# Patient Record
Sex: Female | Born: 1945 | Race: White | Hispanic: No | Marital: Married | Smoking: Never smoker
Health system: Southern US, Community
[De-identification: ages and names within clinical notes are randomized; demographics above are authoritative.]

## PROBLEM LIST (undated history)

## (undated) DIAGNOSIS — R42 Dizziness and giddiness: Secondary | ICD-10-CM

## (undated) DIAGNOSIS — E785 Hyperlipidemia, unspecified: Secondary | ICD-10-CM

## (undated) DIAGNOSIS — Z860100 Personal history of colon polyps, unspecified: Secondary | ICD-10-CM

## (undated) DIAGNOSIS — M1990 Unspecified osteoarthritis, unspecified site: Secondary | ICD-10-CM

## (undated) DIAGNOSIS — M7120 Synovial cyst of popliteal space [Baker], unspecified knee: Secondary | ICD-10-CM

## (undated) HISTORY — DX: Personal history of colonic polyps: Z86.010

## (undated) HISTORY — DX: Unspecified osteoarthritis, unspecified site: M19.90

## (undated) HISTORY — DX: Personal history of colon polyps, unspecified: Z86.0100

## (undated) HISTORY — DX: Hyperlipidemia, unspecified: E78.5

## (undated) HISTORY — PX: JOINT REPLACEMENT: SHX530

## (undated) HISTORY — DX: Synovial cyst of popliteal space (Baker), unspecified knee: M71.20

## (undated) HISTORY — PX: TUBAL LIGATION: SHX77

---

## 1982-03-11 HISTORY — PX: HEMORRHOID SURGERY: SHX153

## 1992-03-11 HISTORY — PX: KNEE SURGERY: SHX244

## 1998-07-20 ENCOUNTER — Other Ambulatory Visit: Admission: RE | Admit: 1998-07-20 | Discharge: 1998-07-20 | Payer: Self-pay | Admitting: Family Medicine

## 1999-06-18 ENCOUNTER — Ambulatory Visit (HOSPITAL_COMMUNITY): Admission: RE | Admit: 1999-06-18 | Discharge: 1999-06-18 | Payer: Self-pay | Admitting: Gastroenterology

## 1999-12-03 ENCOUNTER — Encounter: Payer: Self-pay | Admitting: Internal Medicine

## 1999-12-03 ENCOUNTER — Encounter: Admission: RE | Admit: 1999-12-03 | Discharge: 1999-12-03 | Payer: Self-pay | Admitting: Internal Medicine

## 2000-12-12 ENCOUNTER — Encounter: Payer: Self-pay | Admitting: Internal Medicine

## 2000-12-12 ENCOUNTER — Encounter: Admission: RE | Admit: 2000-12-12 | Discharge: 2000-12-12 | Payer: Self-pay | Admitting: Internal Medicine

## 2002-01-06 ENCOUNTER — Encounter: Payer: Self-pay | Admitting: Internal Medicine

## 2002-01-06 ENCOUNTER — Encounter: Admission: RE | Admit: 2002-01-06 | Discharge: 2002-01-06 | Payer: Self-pay | Admitting: Internal Medicine

## 2002-08-31 ENCOUNTER — Ambulatory Visit (HOSPITAL_COMMUNITY): Admission: RE | Admit: 2002-08-31 | Discharge: 2002-08-31 | Payer: Self-pay | Admitting: Gastroenterology

## 2003-01-24 ENCOUNTER — Encounter: Admission: RE | Admit: 2003-01-24 | Discharge: 2003-01-24 | Payer: Self-pay | Admitting: Internal Medicine

## 2003-12-21 ENCOUNTER — Other Ambulatory Visit: Admission: RE | Admit: 2003-12-21 | Discharge: 2003-12-21 | Payer: Self-pay | Admitting: Internal Medicine

## 2004-02-20 ENCOUNTER — Encounter: Admission: RE | Admit: 2004-02-20 | Discharge: 2004-02-20 | Payer: Self-pay | Admitting: Internal Medicine

## 2005-03-07 ENCOUNTER — Encounter: Admission: RE | Admit: 2005-03-07 | Discharge: 2005-03-07 | Payer: Self-pay | Admitting: Internal Medicine

## 2005-10-22 ENCOUNTER — Ambulatory Visit: Payer: Self-pay | Admitting: Internal Medicine

## 2006-01-24 ENCOUNTER — Other Ambulatory Visit: Admission: RE | Admit: 2006-01-24 | Discharge: 2006-01-24 | Payer: Self-pay | Admitting: Internal Medicine

## 2006-01-24 ENCOUNTER — Ambulatory Visit: Payer: Self-pay | Admitting: Internal Medicine

## 2006-01-24 LAB — CONVERTED CEMR LAB
BUN: 9 mg/dL (ref 6–23)
CO2: 29 meq/L (ref 19–32)
Calcium: 9.5 mg/dL (ref 8.4–10.5)
Chloride: 106 meq/L (ref 96–112)
Creatinine, Ser: 0.8 mg/dL (ref 0.4–1.2)
Glucose, Bld: 94 mg/dL (ref 70–99)
HCT: 42.3 % (ref 36.0–46.0)
Hemoglobin: 14.2 g/dL (ref 12.0–15.0)
MCHC: 33.5 g/dL (ref 30.0–36.0)
Platelets: 191 10*3/uL (ref 150–400)
RBC: 4.44 M/uL (ref 3.87–5.11)
Sodium: 142 meq/L (ref 135–145)
Total Bilirubin: 1.3 mg/dL — ABNORMAL HIGH (ref 0.3–1.2)
Total Protein: 7 g/dL (ref 6.0–8.3)

## 2006-02-08 DIAGNOSIS — Z860100 Personal history of colon polyps, unspecified: Secondary | ICD-10-CM | POA: Insufficient documentation

## 2006-03-07 ENCOUNTER — Encounter: Payer: Self-pay | Admitting: Internal Medicine

## 2006-03-12 ENCOUNTER — Ambulatory Visit: Payer: Self-pay | Admitting: Internal Medicine

## 2006-03-25 ENCOUNTER — Encounter: Admission: RE | Admit: 2006-03-25 | Discharge: 2006-03-25 | Payer: Self-pay | Admitting: Internal Medicine

## 2006-04-15 DIAGNOSIS — M810 Age-related osteoporosis without current pathological fracture: Secondary | ICD-10-CM

## 2006-04-15 DIAGNOSIS — E785 Hyperlipidemia, unspecified: Secondary | ICD-10-CM

## 2006-08-19 ENCOUNTER — Encounter: Admission: RE | Admit: 2006-08-19 | Discharge: 2006-08-19 | Payer: Self-pay | Admitting: Interventional Radiology

## 2006-10-03 ENCOUNTER — Telehealth (INDEPENDENT_AMBULATORY_CARE_PROVIDER_SITE_OTHER): Payer: Self-pay | Admitting: *Deleted

## 2006-10-16 ENCOUNTER — Ambulatory Visit: Payer: Self-pay | Admitting: Internal Medicine

## 2006-10-23 LAB — CONVERTED CEMR LAB
ALT: 20 units/L (ref 0–35)
AST: 22 units/L (ref 0–37)
Direct LDL: 132.4 mg/dL
Total CHOL/HDL Ratio: 3.5
Triglycerides: 79 mg/dL (ref 0–149)
VLDL: 16 mg/dL (ref 0–40)

## 2006-12-16 ENCOUNTER — Encounter (INDEPENDENT_AMBULATORY_CARE_PROVIDER_SITE_OTHER): Payer: Self-pay | Admitting: *Deleted

## 2007-03-11 ENCOUNTER — Ambulatory Visit: Payer: Self-pay | Admitting: Internal Medicine

## 2007-03-27 ENCOUNTER — Encounter: Admission: RE | Admit: 2007-03-27 | Discharge: 2007-03-27 | Payer: Self-pay | Admitting: Internal Medicine

## 2007-04-07 ENCOUNTER — Ambulatory Visit: Payer: Self-pay | Admitting: Internal Medicine

## 2007-04-07 ENCOUNTER — Other Ambulatory Visit: Admission: RE | Admit: 2007-04-07 | Discharge: 2007-04-07 | Payer: Self-pay | Admitting: Internal Medicine

## 2007-04-07 ENCOUNTER — Encounter: Payer: Self-pay | Admitting: Internal Medicine

## 2007-04-07 DIAGNOSIS — R519 Headache, unspecified: Secondary | ICD-10-CM | POA: Insufficient documentation

## 2007-04-07 DIAGNOSIS — R51 Headache: Secondary | ICD-10-CM

## 2007-04-07 LAB — CONVERTED CEMR LAB
Ketones, urine, test strip: NEGATIVE
Nitrite: NEGATIVE
Pap Smear: NORMAL
Urobilinogen, UA: NEGATIVE
WBC Urine, dipstick: NEGATIVE
pH: 6

## 2007-04-10 ENCOUNTER — Encounter (INDEPENDENT_AMBULATORY_CARE_PROVIDER_SITE_OTHER): Payer: Self-pay | Admitting: *Deleted

## 2007-04-10 LAB — CONVERTED CEMR LAB
CO2: 31 meq/L (ref 19–32)
Chloride: 106 meq/L (ref 96–112)
Cholesterol: 153 mg/dL (ref 0–200)
Creatinine, Ser: 0.7 mg/dL (ref 0.4–1.2)
Eosinophils Absolute: 0.1 10*3/uL (ref 0.0–0.6)
Eosinophils Relative: 2.2 % (ref 0.0–5.0)
HCT: 40.8 % (ref 36.0–46.0)
Hemoglobin: 13.5 g/dL (ref 12.0–15.0)
MCV: 94.8 fL (ref 78.0–100.0)
Monocytes Absolute: 0.4 10*3/uL (ref 0.2–0.7)
Neutrophils Relative %: 59.7 % (ref 43.0–77.0)
Potassium: 4.9 meq/L (ref 3.5–5.1)
RBC: 4.31 M/uL (ref 3.87–5.11)
RDW: 10.8 % — ABNORMAL LOW (ref 11.5–14.6)
Sodium: 143 meq/L (ref 135–145)
Total CHOL/HDL Ratio: 4
WBC: 5.1 10*3/uL (ref 4.5–10.5)

## 2007-04-15 ENCOUNTER — Encounter: Payer: Self-pay | Admitting: Internal Medicine

## 2007-05-08 ENCOUNTER — Telehealth (INDEPENDENT_AMBULATORY_CARE_PROVIDER_SITE_OTHER): Payer: Self-pay | Admitting: *Deleted

## 2008-01-06 ENCOUNTER — Ambulatory Visit: Payer: Self-pay | Admitting: Internal Medicine

## 2008-01-06 DIAGNOSIS — J309 Allergic rhinitis, unspecified: Secondary | ICD-10-CM | POA: Insufficient documentation

## 2008-01-06 DIAGNOSIS — R7309 Other abnormal glucose: Secondary | ICD-10-CM | POA: Insufficient documentation

## 2008-01-06 LAB — CONVERTED CEMR LAB
Blood Glucose, AC Bkfst: 107 mg/dL
Vit D, 1,25-Dihydroxy: 32 (ref 30–89)

## 2008-01-12 ENCOUNTER — Encounter (INDEPENDENT_AMBULATORY_CARE_PROVIDER_SITE_OTHER): Payer: Self-pay | Admitting: *Deleted

## 2008-01-12 LAB — CONVERTED CEMR LAB: Hgb A1c MFr Bld: 5.6 % (ref 4.6–6.0)

## 2008-02-23 ENCOUNTER — Telehealth: Payer: Self-pay | Admitting: Internal Medicine

## 2008-03-28 ENCOUNTER — Encounter: Admission: RE | Admit: 2008-03-28 | Discharge: 2008-03-28 | Payer: Self-pay | Admitting: Internal Medicine

## 2008-04-01 ENCOUNTER — Encounter (INDEPENDENT_AMBULATORY_CARE_PROVIDER_SITE_OTHER): Payer: Self-pay | Admitting: *Deleted

## 2008-05-09 ENCOUNTER — Ambulatory Visit: Payer: Self-pay | Admitting: Family Medicine

## 2008-05-30 ENCOUNTER — Ambulatory Visit: Payer: Self-pay | Admitting: Internal Medicine

## 2008-05-30 ENCOUNTER — Encounter: Payer: Self-pay | Admitting: Internal Medicine

## 2008-05-30 ENCOUNTER — Other Ambulatory Visit: Admission: RE | Admit: 2008-05-30 | Discharge: 2008-05-30 | Payer: Self-pay | Admitting: Internal Medicine

## 2008-06-02 ENCOUNTER — Encounter (INDEPENDENT_AMBULATORY_CARE_PROVIDER_SITE_OTHER): Payer: Self-pay | Admitting: *Deleted

## 2008-06-03 ENCOUNTER — Ambulatory Visit: Payer: Self-pay | Admitting: Internal Medicine

## 2008-06-05 LAB — CONVERTED CEMR LAB
AST: 24 units/L (ref 0–37)
Basophils Absolute: 0 10*3/uL (ref 0.0–0.1)
Cholesterol: 182 mg/dL (ref 0–200)
Eosinophils Relative: 3.2 % (ref 0.0–5.0)
GFR calc non Af Amer: 108.02 mL/min (ref >60)
Glucose, Bld: 95 mg/dL (ref 70–99)
LDL Cholesterol: 109 mg/dL — ABNORMAL HIGH (ref 0–99)
MCHC: 34.6 g/dL (ref 30.0–36.0)
Monocytes Absolute: 0.4 10*3/uL (ref 0.1–1.0)
Monocytes Relative: 8.2 % (ref 3.0–12.0)
Neutrophils Relative %: 50.7 % (ref 43.0–77.0)
Potassium: 4 meq/L (ref 3.5–5.1)
RBC: 4.06 M/uL (ref 3.87–5.11)
Sodium: 143 meq/L (ref 135–145)
TSH: 1.03 microintl units/mL (ref 0.35–5.50)
VLDL: 15.4 mg/dL (ref 0.0–40.0)

## 2008-06-06 ENCOUNTER — Encounter (INDEPENDENT_AMBULATORY_CARE_PROVIDER_SITE_OTHER): Payer: Self-pay | Admitting: *Deleted

## 2009-01-11 ENCOUNTER — Ambulatory Visit: Payer: Self-pay | Admitting: Family

## 2009-01-13 ENCOUNTER — Telehealth: Payer: Self-pay | Admitting: Family

## 2009-03-24 ENCOUNTER — Telehealth (INDEPENDENT_AMBULATORY_CARE_PROVIDER_SITE_OTHER): Payer: Self-pay | Admitting: *Deleted

## 2009-03-28 ENCOUNTER — Ambulatory Visit: Payer: Self-pay | Admitting: Internal Medicine

## 2009-04-05 ENCOUNTER — Encounter: Admission: RE | Admit: 2009-04-05 | Discharge: 2009-04-05 | Payer: Self-pay | Admitting: Internal Medicine

## 2009-06-05 ENCOUNTER — Other Ambulatory Visit: Admission: RE | Admit: 2009-06-05 | Discharge: 2009-06-05 | Payer: Self-pay | Admitting: Internal Medicine

## 2009-06-05 ENCOUNTER — Ambulatory Visit: Payer: Self-pay | Admitting: Internal Medicine

## 2009-06-05 LAB — HM PAP SMEAR

## 2009-06-10 LAB — CONVERTED CEMR LAB: Pap Smear: NEGATIVE

## 2009-06-28 ENCOUNTER — Ambulatory Visit: Payer: Self-pay | Admitting: Internal Medicine

## 2009-06-28 LAB — CONVERTED CEMR LAB: Vit D, 25-Hydroxy: 40 ng/mL (ref 30–89)

## 2009-07-03 LAB — CONVERTED CEMR LAB
ALT: 25 units/L (ref 0–35)
AST: 28 units/L (ref 0–37)
BUN: 9 mg/dL (ref 6–23)
Basophils Absolute: 0 10*3/uL (ref 0.0–0.1)
Chloride: 107 meq/L (ref 96–112)
Cholesterol: 179 mg/dL (ref 0–200)
Creatinine, Ser: 0.7 mg/dL (ref 0.4–1.2)
Eosinophils Relative: 2.6 % (ref 0.0–5.0)
Glucose, Bld: 87 mg/dL (ref 70–99)
HCT: 36.8 % (ref 36.0–46.0)
LDL Cholesterol: 106 mg/dL — ABNORMAL HIGH (ref 0–99)
Lymphocytes Relative: 35.5 % (ref 12.0–46.0)
Lymphs Abs: 1.8 10*3/uL (ref 0.7–4.0)
Monocytes Relative: 6.7 % (ref 3.0–12.0)
Neutrophils Relative %: 54.8 % (ref 43.0–77.0)
Platelets: 166 10*3/uL (ref 150.0–400.0)
RDW: 12.3 % (ref 11.5–14.6)
Sodium: 144 meq/L (ref 135–145)
TSH: 1.55 microintl units/mL (ref 0.35–5.50)
Total CHOL/HDL Ratio: 3
VLDL: 16.6 mg/dL (ref 0.0–40.0)
WBC: 5 10*3/uL (ref 4.5–10.5)

## 2010-01-11 ENCOUNTER — Encounter (INDEPENDENT_AMBULATORY_CARE_PROVIDER_SITE_OTHER): Payer: Self-pay | Admitting: *Deleted

## 2010-01-19 ENCOUNTER — Ambulatory Visit: Payer: Self-pay | Admitting: Internal Medicine

## 2010-02-06 ENCOUNTER — Telehealth: Payer: Self-pay | Admitting: Internal Medicine

## 2010-02-07 ENCOUNTER — Telehealth: Payer: Self-pay | Admitting: Internal Medicine

## 2010-02-09 ENCOUNTER — Ambulatory Visit: Payer: Self-pay | Admitting: Internal Medicine

## 2010-02-09 ENCOUNTER — Encounter: Payer: Self-pay | Admitting: Internal Medicine

## 2010-02-12 LAB — CONVERTED CEMR LAB
BUN: 18 mg/dL
Basophils Absolute: 0 10*3/uL
Basophils Relative: 0.3 %
CO2: 30 meq/L
Calcium: 9.8 mg/dL
Chloride: 102 meq/L
Creatinine, Ser: 0.7 mg/dL
Eosinophils Absolute: 0.1 10*3/uL
Eosinophils Relative: 1.4 %
GFR calc non Af Amer: 91.42 mL/min
Glucose, Bld: 109 mg/dL — ABNORMAL HIGH
HCT: 42.5 %
Hemoglobin: 14.6 g/dL
Lymphocytes Relative: 34.9 %
Lymphs Abs: 2.3 10*3/uL
MCHC: 34.3 g/dL
MCV: 95.4 fL
Monocytes Absolute: 0.6 10*3/uL
Monocytes Relative: 8.4 %
Neutro Abs: 3.6 10*3/uL
Neutrophils Relative %: 55 %
Platelets: 203 10*3/uL
Potassium: 5.4 meq/L — ABNORMAL HIGH
RBC: 4.46 M/uL
RDW: 11.9 %
Sodium: 142 meq/L
WBC: 6.5 10*3/uL

## 2010-02-13 ENCOUNTER — Ambulatory Visit: Payer: Self-pay

## 2010-02-13 ENCOUNTER — Ambulatory Visit (HOSPITAL_COMMUNITY)
Admission: RE | Admit: 2010-02-13 | Discharge: 2010-02-13 | Payer: Self-pay | Source: Home / Self Care | Admitting: Internal Medicine

## 2010-02-13 ENCOUNTER — Encounter: Payer: Self-pay | Admitting: Internal Medicine

## 2010-02-13 ENCOUNTER — Telehealth (INDEPENDENT_AMBULATORY_CARE_PROVIDER_SITE_OTHER): Payer: Self-pay | Admitting: Radiology

## 2010-02-14 ENCOUNTER — Encounter (HOSPITAL_COMMUNITY)
Admission: RE | Admit: 2010-02-14 | Discharge: 2010-04-10 | Payer: Self-pay | Source: Home / Self Care | Attending: Internal Medicine | Admitting: Internal Medicine

## 2010-02-14 ENCOUNTER — Encounter: Payer: Self-pay | Admitting: *Deleted

## 2010-02-14 ENCOUNTER — Ambulatory Visit: Payer: Self-pay

## 2010-02-14 ENCOUNTER — Encounter: Payer: Self-pay | Admitting: Cardiology

## 2010-02-15 ENCOUNTER — Telehealth: Payer: Self-pay | Admitting: Internal Medicine

## 2010-04-06 ENCOUNTER — Encounter
Admission: RE | Admit: 2010-04-06 | Discharge: 2010-04-06 | Payer: Self-pay | Source: Home / Self Care | Attending: Internal Medicine | Admitting: Internal Medicine

## 2010-04-06 LAB — HM MAMMOGRAPHY: HM Mammogram: NEGATIVE

## 2010-04-10 NOTE — Assessment & Plan Note (Signed)
Summary: CHEST TIGHTNESS/REQUESTS CARDS REFERRAL FOR STRESS TEST/KB/rs...   Vital Signs:  Patient profile:   65 year old female Weight:      153.13 pounds Pulse rate:   88 / minute Pulse rhythm:   regular BP sitting:   116 / 84  (left arm) Cuff size:   large  Vitals Entered By: Army Fossa CMA (February 09, 2010 11:34 AM) CC: Pt here c/o discomfort in chest  Comments x 2 weeks Referral?  [facility] [address]    History of Present Illness: 2 weeks history of chest discomfort The discomfort is located anteriorly, described as an ache or  pressure. It may last few hours. No apparent triggers, no associated with exercise. no radiation, no associated with shortness of breath, intensity is 2/10 Sometimes has noticed nausea but inconsistently so no dizziness, lightheadedness?  Review of systems No fever No edema Rarely has palpitations No vomiting or GERD symptoms No cough For the last few months has not this dyspnea on exertion but no dyspnea at the time of chest discomfort Admits to stress in her life she did take an airplane  2 months ago, following the trip did not have leg pain or swelling  Current Medications (verified): 1)  Simvastatin 20 Mg  Tabs (Simvastatin) .... Take One Tablet Every Day. 2)  Flonase 50 Mcg/act  Susp (Fluticasone Propionate) .... Prn 3)  Otc Allergy Med .... As Needed 4)  Fish Oil--Vit D--Ca--Mvi-- Glucosamine 5)  Golden Na 6)  Bayer Low Strength 81 Mg Tbec (Aspirin) 7)  Boniva 150 Mg Tabs (Ibandronate Sodium) .Marland Kitchen.. 1 By Mouth Qmo  Allergies (verified): No Known Drug Allergies  Past History:  Past Medical History: Reviewed history from 06/05/2009 and no changes required. Hyperlipidemia Osteoporosis Colonic polyps, multiple Cscopes, last 12-07: TICS only, next 2012 Headache Allergic rhinitis OA-- sees ortho, 2011 they talked about TKR L h/o B Baker's cyst at the L per MRI ordered by ortho aprox 2008 patient thinks is B  Past Surgical  History: Reviewed history from 04/15/2006 and no changes required. Hemorrhoidectomy (1984) Tubal ligation  Family History: Reviewed history from 05/30/2008 and no changes required. MI-GM age 55 breast ca--no colon ca--mother HTN--mother myeloid dysplasia--F   Social History: Reviewed history from 06/05/2009 and no changes required. 2 kids Married Occupation: Location manager) tobacco-- never ETOH-- occasionally wine exercise-- + 3/week diet-- healthy   Physical Exam  General:  alert, well-developed, and well-nourished.   Neck:  no masses and normal carotid upstroke.   Chest Wall:  no tenderness.   Lungs:  normal respiratory effort, no intercostal retractions, no accessory muscle use, and normal breath sounds.   Heart:  normal rate, regular rhythm, and no murmur.   Abdomen:  soft, non-tender, no distention, no masses, no guarding, and no rigidity.   Extremities:  calves  are measured and symmetric. No tender to palpation. right ankle is slightly larger than the left, chronic finding per patient Psych:  not anxious appearing and not depressed appearing.     Impression & Recommendations:  Problem # 1:  CHEST PAIN (ICD-27.20) 65 year old female with chest discomfort for 2 weeks. Cardiovascular risk factors: No hypertension, well-controlled cholesterol, nonsmoker. Grandmother had CAD in her 15s she is active and exercises routinely without chest pain. EKG normal sinus plan: Labs, chest x-ray Increase aspirin to 325 Nitroglycerin p.r.n. ER if symptoms severe Stress test, echo  Orders: EKG w/ Interpretation (93000) Venipuncture (16109) TLB-BMP (Basic Metabolic Panel-BMET) (80048-METABOL) TLB-CBC Platelet -  w/Differential (85025-CBCD) T-2 View CXR (71020TC) Cardiology Referral (Cardiology)  Complete Medication List: 1)  Simvastatin 20 Mg Tabs (Simvastatin) .... Take one tablet every day. 2)  Flonase 50 Mcg/act Susp (Fluticasone  propionate) .... Prn 3)  Otc Allergy Med  .... As needed 4)  Fish Oil--vit D--ca--mvi-- Glucosamine  5)  Golden Na  6)  Aspirin 325 Mg Tabs (Aspirin) .... One a day 7)  Boniva 150 Mg Tabs (Ibandronate sodium) .Marland Kitchen.. 1 by mouth qmo 8)  Nitrostat 0.4 Mg Subl (Nitroglycerin) .... One sublingual every 5 minutes x3 if chest pain  Patient Instructions: 1)  increase aspirin to 325 mg 2)  Nitroglycerin if chest pain 3)  if the chest pain is severe--->  go to the ER 4)  return to the office in one month Prescriptions: NITROSTAT 0.4 MG SUBL (NITROGLYCERIN) one sublingual every 5 minutes x3 if chest pain  #20 x 0   Entered and Authorized by:   Nolon Rod. Kyriaki Moder MD   Signed by:   Nolon Rod. Raiyah Speakman MD on 02/09/2010   Method used:   Print then Give to Patient   RxID:   [id number]    Orders Added: 1)  EKG w/ Interpretation [93000] 2)  Venipuncture [36415] 3)  TLB-BMP (Basic Metabolic Panel-BMET) [80048-METABOL] 4)  TLB-CBC Platelet - w/Differential [85025-CBCD] 5)  T-2 View CXR [71020TC] 6)  Cardiology Referral [Cardiology] 7)  Est. Patient Level IV [30160]

## 2010-04-10 NOTE — Miscellaneous (Signed)
Summary: got flu shot @ [facility]   Immunization History:  Influenza Immunization History:    Influenza:  got @ [facility] (12/28/2009)

## 2010-04-10 NOTE — Assessment & Plan Note (Signed)
Summary: Cardiology Nuclear Testing  Nuclear Med Background Indications for Stress Test: Evaluation for Ischemia     Symptoms: Chest Pressure, Chest Tightness, Dizziness, DOE, Light-Headedness, Palpitations    Nuclear Pre-Procedure Cardiac Risk Factors: Family History - CAD, Lipids Caffeine/Decaff Intake: none NPO After: 7:00 AM Lungs: clear IV 0.9% NS with Angio Cath: 22g     IV Site: R Antecubital IV Started by: Bonnita Levan, RN Chest Size (in) 36     Cup Size D     Height (in): 63.75 Weight (lb): 154 BMI: 26.74  Nuclear Med Study 1 or 2 day study:  1 day     Stress Test Type:  Stress Reading MD:  Cassell Clement, MD     Referring MD:  J.Paz Resting Radionuclide:  Technetium 33m Tetrofosmin     Resting Radionuclide Dose:  11 mCi  Stress Radionuclide:  Technetium 90m Tetrofosmin     Stress Radionuclide Dose:  33 mCi   Stress Protocol Exercise Time (min):  6:30 min     Max HR:  157 bpm     Predicted Max HR:  158 bpm  Max Systolic BP: 171 mm Hg     Percent Max HR:  99.37 %     METS: 7.70 Rate Pressure Product:  16109    Stress Test Technologist:  Milana Na, EMT-P     Nuclear Technologist:  Domenic Polite, CNMT  Rest Procedure  Myocardial perfusion imaging was performed at rest 45 minutes following the intravenous administration of Technetium 75m Tetrofosmin.  Stress Procedure  The patient exercised for 6:30. The patient stopped due to fatigue and denied any chest pain.  There were no significant ST-T wave changes and rare pvcs/pacs.  Technetium 13m Tetrofosmin was injected at peak exercise and myocardial perfusion imaging was performed after a brief delay.  QPS Raw Data Images:  Normal; no motion artifact; normal heart/lung ratio. Stress Images:  Normal homogeneous uptake in all areas of the myocardium. Rest Images:  Normal homogeneous uptake in all areas of the myocardium. Subtraction (SDS):  No evidence of ischemia. Transient Ischemic Dilatation:  1.06   (Normal <1.22)  Lung/Heart Ratio:  .21  (Normal <0.45)  Quantitative Gated Spect Images QGS EDV:  71 ml QGS ESV:  20 ml QGS EF:  72 % QGS cine images:  No wall motion abnormalities  Findings Normal nuclear study      Overall Impression  Exercise Capacity: Good exercise capacity. BP Response: Normal blood pressure response. Clinical Symptoms: No chest pain ECG Impression: No significant ST segment change suggestive of ischemia. Overall Impression: Normal stress nuclear study.

## 2010-04-10 NOTE — Progress Notes (Signed)
Summary: left msg for pt to call  Phone Note Call from Patient   Caller: Patient Summary of Call: pt called and left msg having sinus and head cold, wanted to know what can she use?  Left msg for pt to call.Kandice Hams  March 24, 2009 3:04 PM   Follow-up for Phone Call        Called pt back, she says she is using Mucinex and nasal washes taking Tylenol PE, offered to make appt for Sat clinic, pt refused says she will watch over the weekend .Kandice Hams  March 24, 2009 5:06 PM  Follow-up by: Kandice Hams,  March 24, 2009 5:06 PM

## 2010-04-10 NOTE — Assessment & Plan Note (Signed)
Summary: CPX//PH   Vital Signs:  Patient profile:   65 year old female Height:      63.75 inches Weight:      155 pounds Pulse rate:   70 / minute BP sitting:   110 / 60  Vitals Entered By: Shary Decamp (June 05, 2009 12:57 PM) CC: cpx - due pap, not fasting Is Patient Diabetic? No Comments  - c/o of sinus, chest congestion x last week Shary Decamp  June 05, 2009 12:59 PM    History of Present Illness: CPX  c/o of sinus, chest congestion x last week denies fever, cough is mild with some mucus, taking over-the-counter Sudafed.  Current Medications (verified): 1)  Simvastatin 20 Mg  Tabs (Simvastatin) .... Take One Tablet Every Day. 2)  Flonase 50 Mcg/act  Susp (Fluticasone Propionate) .... Prn 3)  Otc Allergy Med .... As Needed 4)  Fish Oil--Vit D--Ca--Mvi-- Glucosamine 5)  Golden Na 6)  Bayer Low Strength 81 Mg Tbec (Aspirin) 7)  Boniva 150 Mg Tabs (Ibandronate Sodium) .Marland Kitchen.. 1 By Mouth Qmo  Allergies (verified): No Known Drug Allergies  Past History:  Past Medical History: Hyperlipidemia Osteoporosis Colonic polyps, multiple Cscopes, last 12-07: TICS only, next 2012 Headache Allergic rhinitis OA-- sees ortho, 2011 they talked about TKR L h/o B Baker's cyst at the L per MRI ordered by ortho aprox 2008 patient thinks is B  Past Surgical History: Reviewed history from 04/15/2006 and no changes required. Hemorrhoidectomy (1984) Tubal ligation  Family History: Reviewed history from 05/30/2008 and no changes required. 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 GI:  Denies bloody stools, diarrhea, nausea, and vomiting. GU:  Denies dysuria; no vag d/c or bleed . Psych:   Denies anxiety and depression.  Physical Exam  General:  alert, well-developed, and well-nourished.   Head:  face symmetric, not tender to palpation Ears:  wax  bilaterally Nose:  slightly congested Mouth:  no redness or discharge Neck:  no thyromegaly.   Breasts:  No mass, nodules, thickening, tenderness, bulging, retraction, inflamation, nipple discharge or skin changes noted.   Lungs:  normal respiratory effort, no intercostal retractions, no accessory muscle use, and normal breath sounds.   Heart:  normal rate, regular rhythm, and no murmur.   Abdomen:  soft, non-tender, no distention, no masses, and no guarding.   Genitalia:  normal introitus, no external lesions, no vaginal discharge, mucosa pink and moist, no vaginal or cervical lesions, no vaginal atrophy, no friaility or hemorrhage, normal uterus size and position, and no adnexal masses or tenderness.   Extremities:  no pretibial edema calves are symmetric and nontender Psych:  Oriented X3, memory intact for recent and remote, normally interactive, good eye contact, not anxious appearing, and not depressed appearing.     Impression & Recommendations:  Problem # 1:  HEALTH SCREENING (ICD-V70.0) Td 07 UTD on Cscope , next 2012 (Dr Madilyn Fireman) mammogram negative-----1/11 Pap neg ----3/10, Pap sent labs continue self breast exam  Problem # 2:  OSTEOPOROSIS (ICD-733.00)  due for a bone density test check vitamin D advised to remain active, take boniva  and vitamin D supplements as she is doing Her updated medication list for this problem includes:    Boniva 150 Mg Tabs (Ibandronate sodium) .Marland KitchenMarland KitchenMarland KitchenMarland Kitchen  1 by mouth qmo  Orders: Radiology Referral (Radiology)  Complete Medication List: 1)  Simvastatin 20 Mg Tabs (Simvastatin) .... Take one tablet every day. 2)  Flonase 50 Mcg/act Susp (Fluticasone propionate) .... Prn 3)  Otc Allergy Med  .... As needed 4)  Fish Oil--vit D--ca--mvi-- Glucosamine  5)  Golden Na  6)  Bayer Low  Strength 81 Mg Tbec (Aspirin) 7)  Boniva 150 Mg Tabs (Ibandronate sodium) .Marland Kitchen.. 1 by mouth qmo  Patient Instructions: 1)  come back fasting 2)  FLP, AST, ALT, CBC, TSH, BMP, vitamin D   V70 3)  Please schedule a follow-up appointment in 6 months .  Prescriptions: SIMVASTATIN 20 MG  TABS (SIMVASTATIN) Take one tablet every day.  #30.0 Each x 11   Entered by:   Shary Decamp   Authorized by:   Nolon Rod. Maybel Dambrosio MD   Signed by:   Shary Decamp on 06/05/2009   Method used:   Electronically to        [id number]* (retail)       [address]       [place], [place]  [address]       Ph: [phone]       Fax: [phone]   RxID:   [id number] BONIVA 150 MG TABS (IBANDRONATE SODIUM) 1 by mouth qmo  #1.0 Each x 11   Entered by:   Shary Decamp   Authorized by:   Nolon Rod. Jaciel Diem MD   Signed by:   Shary Decamp on 06/05/2009   Method used:   Electronically to        [facility] #[id number]* (retail)       [address]       [place], [place]  [address]       Ph: [phone]       Fax: [phone]   RxID:   [id number] FLONASE 50 MCG/ACT  SUSP (FLUTICASONE PROPIONATE) prn  #1 x 11   Entered by:   Shary Decamp   Authorized by:   Nolon Rod. Makalynn Berwanger MD   Signed by:   Shary Decamp on 06/05/2009   Method used:   Electronically to        [id number]* (retail)       [address]       [place], [place]  [address]       Ph: [phone]       Fax: [phone]   RxID:   [id number]    Preventive Care Screening  Prior Values:    Pap Smear:  NEGATIVE FOR INTRAEPITHELIAL LESIONS OR MALIGNANCY. (05/30/2008)    Mammogram:  ASSESSMENT: Negative - BI-RADS 1^MM DIGITAL SCREENING (04/05/2009)    Colonoscopy:  Results: Diverticulosis.       Location:  [facility].   (03/07/2006)    Last Tetanus Booster:  adacel (04/11/2005)    Risk  Factors: Tobacco use:  never Alcohol use:  yes    Drinks per day:  <1 Exercise:  yes    Type:  gym-water arerobics-walk  Colonoscopy History:    Date of Last Colonoscopy:  03/07/2006  Mammogram History:    Date of Last Mammogram:  04/05/2009  @  [facility]  PAP Smear History:    Date of Last PAP Smear:  05/30/2008

## 2010-04-10 NOTE — Assessment & Plan Note (Signed)
Summary: COUGHING/CONGESTION/KDC   Vital Signs:  Patient profile:   65 year old female Height:      63.75 inches Weight:      156.8 pounds BMI:     27.22 Temp:     98.9 degrees F BP sitting:   108 / 70  Vitals Entered By: Dena Billet CC: Chest congestion/cough Comments Requests refill on tussionex-said worked well   History of Present Illness: as above  5 days history of some cough, sore throat and sneezing use in Sudafed PE, Tylenol, Mucinex and a [employer] today she feels better than previous days  Allergies: No Known Drug Allergies  Past History:  Past Medical History: Reviewed history from 05/30/2008 and no changes required. Hyperlipidemia Osteoporosis Colonic polyps, multiple Cscopes, last 12-07: TICS only, next 2012 Headache Allergic rhinitis h/o B Baker's cyst at the L per MRI ordered by ortho aprox 2008 patient thinks is B)  Past Surgical History: Reviewed history from 04/15/2006 and no changes required. Hemorrhoidectomy (1984) Tubal ligation  Review of Systems       no fever some sinus and chest congestion, nasal discharge on mild amount of sputum no nausea or vomiting  Physical Exam  General:  alert, well-developed, and well-nourished.   Head:  not tender to palpation Ears:  narrow canals, mild amount of wax Nose:  slightly congested Mouth:  no redness or discharge Lungs:  few rhonchi with cough otherwise clear and no distress Heart:  normal rate, regular rhythm, and no murmur.     Impression & Recommendations:  Problem # 1:  BRONCHITIS- ACUTE (ICD-466.0) see instructions The following medications were removed from the medication list:    Tessalon Perles 100 Mg Caps (Benzonatate) ..... One cap every 8 hours as needed for cough Her updated medication list for this problem includes:    Tussionex Pennkinetic Er 8-10 Mg/66ml Lqcr (Chlorpheniramine-hydrocodone) ..... One teaspoon by mouth every 12 hours as needed for cough.    Amoxicillin 500  Mg Caps (Amoxicillin) .Marland Kitchen..Marland Kitchen Two tablets by mouth twice a day for one week  Complete Medication List: 1)  Simvastatin 20 Mg Tabs (Simvastatin) .... Take one tablet every day. 2)  Flonase 50 Mcg/act Susp (Fluticasone propionate) .... Prn 3)  Otc Allergy Med  .... As needed 4)  Fish Oil--vit D--ca--mvi-- Glucosamine  5)  Golden Na  6)  Bayer Low Strength 81 Mg Tbec (Aspirin) 7)  Boniva 150 Mg Tabs (Ibandronate sodium) .Marland Kitchen.. 1 by mouth qmo 8)  Tussionex Pennkinetic Er 8-10 Mg/39ml Lqcr (Chlorpheniramine-hydrocodone) .... One teaspoon by mouth every 12 hours as needed for cough. 9)  Amoxicillin 500 Mg Caps (Amoxicillin) .... Two tablets by mouth twice a day for one week  Patient Instructions: 1)  rest and lots of fluids 2)  continue using Sudafed PE, Tylenol, Mucinex and a [place] [place] as needed 3)  Tussionex as needed  if the cough is severe 4)  amoxicillin if no better in 2-3 days  5)  call if not better in a few days, call anytime if you feel worse Prescriptions: TUSSIONEX PENNKINETIC ER 8-10 MG/5ML LQCR (CHLORPHENIRAMINE-HYDROCODONE) one teaspoon by mouth every 12 hours as needed for cough.  #180cc x 0   Entered and Authorized by:   Nolon Rod. Alexande Sheerin MD   Signed by:   Nolon Rod. Wafaa Deemer MD on 03/28/2009   Method used:   Print then Give to Patient   RxID:   [id number] AMOXICILLIN 500 MG CAPS (AMOXICILLIN) two tablets by mouth twice a day for  one week  #28 x 0   Entered and Authorized by:   Nolon Rod. Braidyn Peace MD   Signed by:   Nolon Rod. Armas Mcbee MD on 03/28/2009   Method used:   Print then Give to Patient   RxID:   [id number]

## 2010-04-10 NOTE — Progress Notes (Signed)
Summary: Chest tightness  Phone Note Call from Patient Call back at Home Phone [phone] Call back at [phone]   Summary of Call: Patient left message on triage that she would like to be referred for stress test.   I spoke with the patient and she has been having tightness in her chest and slight tingling in her hands at times (she notes it is not all the time). Patient denies swelling, blurry vision, nausea, SOB, extremity pain, and excessive sweating.  She was advised that this will require an office visit and schedule appt for Thursday due to jury duty. She was advised that if symptoms are unbearable or get worse to head to the ER immediately. Patient expressed understanding. Initial call taken by: Lucious Groves CMA,  February 06, 2010 2:55 PM

## 2010-04-10 NOTE — Progress Notes (Signed)
Summary: Echo results  Phone Note Call from Patient Call back at Home Phone [phone]   Reason for Call: Insurance Question Summary of Call: Echo results. Please advise. Initial call taken by: Lucious Groves CMA,  February 15, 2010 8:05 AM  Follow-up for Phone Call        advised patient, echocardiogram and stress test negative. Is unlikely that the chest pain is cardiac. Discontinue nitroglycerin  observe for now, call if symptoms severe and  come back in one month Follow-up by: Jose E. Paz MD,  February 15, 2010 12:55 PM  Additional Follow-up for Phone Call Additional follow up Details #1::        Patient notified. Additional Follow-up by: Lucious Groves CMA,  February 15, 2010 3:03 PM

## 2010-04-10 NOTE — Assessment & Plan Note (Signed)
Summary: bowel problems--///sph   Vital Signs:  Patient profile:   65 year old female Height:      63.75 inches Weight:      154 pounds BMI:     26.74 Pulse rate:   65 / minute Pulse rhythm:   regular BP sitting:   132 / 82  (left arm) Cuff size:   large  Vitals Entered By: Army Fossa CMA (January 19, 2010 11:31 AM) CC: Pt here for f/u- not fasting Comments was have trouble with loose stools- better now [address]    History of Present Illness: 2 weeks ago she had loose, not watery stools diameter of stools slightly  narrow? things are now back to normal worried about colon ca   ROX (-) fever (-) nausea , vomoting no blood in stools  has noted wt gain w/o vin diet-exercise   Current Medications (verified): 1)  Simvastatin 20 Mg  Tabs (Simvastatin) .... Take One Tablet Every Day. 2)  Flonase 50 Mcg/act  Susp (Fluticasone Propionate) .... Prn 3)  Otc Allergy Med .... As Needed 4)  Fish Oil--Vit D--Ca--Mvi-- Glucosamine 5)  Golden Na 6)  Bayer Low Strength 81 Mg Tbec (Aspirin) 7)  Boniva 150 Mg Tabs (Ibandronate Sodium) .Marland Kitchen.. 1 By Mouth Qmo  Allergies (verified): No Known Drug Allergies  Past History:  Past Medical History: Reviewed history from 06/05/2009 and no changes required. Hyperlipidemia Osteoporosis Colonic polyps, multiple Cscopes, last 12-07: TICS only, next 2012 Headache Allergic rhinitis OA-- sees ortho, 2011 they talked about TKR L h/o B Baker's cyst at the L per MRI ordered by ortho aprox 2008 patient thinks is B  Past Surgical History: Reviewed history from 04/15/2006 and no changes required. Hemorrhoidectomy (1984) Tubal ligation  Social History: Reviewed history from 06/05/2009 and no changes required. 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   Abdomen:  soft, non-tender, no distention, no masses, no guarding, no rigidity, no rebound tenderness, no abdominal hernia, no hepatomegaly, and no splenomegaly.   Rectal:  No external abnormalities noted. Normal sphincter tone. No rectal masses or tenderness. hemocult neg    Impression & Recommendations:  Problem # 1:  DIARRHEA (ICD-787.91) transiet diarrhea, patient concerned about colon ca hemocult neg today, normal DRE plan: observe, will call if problems resurface  Complete Medication List: 1)  Simvastatin 20 Mg Tabs (Simvastatin) .... Take one tablet every day. 2)  Flonase 50 Mcg/act Susp (Fluticasone propionate) .... Prn 3)  Otc Allergy Med  .... As needed 4)  Fish Oil--vit D--ca--mvi-- Glucosamine  5)  Golden Na  6)  Bayer Low Strength 81 Mg Tbec (Aspirin) 7)  Boniva 150 Mg Tabs (Ibandronate sodium) .Marland Kitchen.. 1 by mouth qmo   Orders Added: 1)  Est. Patient Level III [[address]109]

## 2010-04-10 NOTE — Progress Notes (Signed)
Summary: checking on the patient  Phone Note Outgoing Call   Summary of Call: called to check on the patient, left a message. Advised ER if symptoms severe She has an appointment 2 days from today but we could see her later today or tomorrow as well. Jose E. Paz MD  February 07, 2010 1:26 PM   Follow-up for Phone Call        has an appointment today Follow-up by: [facility] E. Paz MD,  February 09, 2010 9:16 AM

## 2010-04-10 NOTE — Miscellaneous (Signed)
Summary: repeat colon due in 5y  Clinical Lists Changes  Observations: Added new observation of COLONRECACT: Repeat colonoscopy in 5 years.  (03/07/2006 9:30) Added new observation of COLONOSCOPY: Results: Diverticulosis.       Location:  [facility].   (03/07/2006 9:30)      Colonoscopy  Procedure date:  03/07/2006  Findings:      Results: Diverticulosis.       Location:  [facility].    Comments:      Repeat colonoscopy in 5 years.    Colonoscopy  Procedure date:  03/07/2006  Findings:      Results: Diverticulosis.       Location:  [facility].    Comments:      Repeat colonoscopy in 5 years.

## 2010-04-10 NOTE — Progress Notes (Signed)
Summary: Nuc Pre-Procedure  Phone Note Other Incoming   Caller: Burna Mortimer Maeven Mcdougall,RT-N Action Taken: Phone Call Completed Summary of Call: Reviewed information on [employer] Information Sheet (see scanned document for further details).  Spoke with patient in person after Echo today. Initial call taken by: Leonia Corona, RT-N,  February 13, 2010 11:12 AM     Nuclear Med Background Indications for Stress Test: Evaluation for Ischemia     Symptoms: Chest Pressure, Chest Tightness    Nuclear Pre-Procedure Cardiac Risk Factors: Family History - CAD, Lipids Height (in): 63.75

## 2010-04-13 NOTE — Procedures (Signed)
Summary: Colonoscopy/[facility]  Colonoscopy/[facility]   Imported By: Lanelle Bal 04/07/2009 11:24:37  _____________________________________________________________________  External Attachment:    Type:   Image     Comment:   External Document

## 2010-05-11 ENCOUNTER — Ambulatory Visit (INDEPENDENT_AMBULATORY_CARE_PROVIDER_SITE_OTHER): Payer: BC Managed Care – PPO | Admitting: Internal Medicine

## 2010-05-11 ENCOUNTER — Encounter: Payer: Self-pay | Admitting: Internal Medicine

## 2010-05-11 DIAGNOSIS — R1030 Lower abdominal pain, unspecified: Secondary | ICD-10-CM | POA: Insufficient documentation

## 2010-05-11 DIAGNOSIS — R109 Unspecified abdominal pain: Secondary | ICD-10-CM

## 2010-05-17 NOTE — Assessment & Plan Note (Signed)
Summary: pain in groin/cbs   Vital Signs:  Patient profile:   65 year old female Weight:      155.38 pounds Pulse rate:   76 / minute Pulse rhythm:   regular BP sitting:   124 / 80  (left arm) Cuff size:   large  Vitals Entered By: Army Fossa CMA (May 11, 2010 10:48 AM) CC: Nagging pain in groin area x 2 month, CHF Management Comments pain is all the time [facility] [address]    History of Present Illness: 2 months h/o daily, on-off pain at the R groin it can be present at rest or when walking or standing not exhacerbated by any particular posture  RO no  N-V-D , no blood in the stools no hernia or groin mass  no injury or fall no back pain no dysuria , hematuria    Current Medications (verified): 1)  Simvastatin 20 Mg  Tabs (Simvastatin) .... Take One Tablet Every Day. 2)  Flonase 50 Mcg/act  Susp (Fluticasone Propionate) .... Prn 3)  Otc Allergy Med .... As Needed 4)  Fish Oil--Vit D--Ca--Mvi-- Glucosamine 5)  Golden Na 6)  Aspir-Low 81 Mg Tbec (Aspirin) .... Qd 7)  Boniva 150 Mg Tabs (Ibandronate Sodium) .Marland Kitchen.. 1 By Mouth Qmo 8)  Nitrostat 0.4 Mg Subl (Nitroglycerin) .... One Sublingual Every 5 Minutes X3 If Chest Pain  Allergies (verified): No Known Drug Allergies  Past History:  Past Medical History: Reviewed history from 06/05/2009 and no changes required. Hyperlipidemia Osteoporosis Colonic polyps, multiple Cscopes, last 12-07: TICS only, next 2012 Headache Allergic rhinitis OA-- sees ortho, 2011 they talked about TKR L h/o B Baker's cyst at the L per MRI ordered by ortho aprox 2008 patient thinks is B  Past Surgical History: Reviewed history from 04/15/2006 and no changes required. Hemorrhoidectomy (1984) Tubal ligation  Social History: Reviewed history from 06/05/2009 and no changes required. 2 kids Married Occupation: Location manager) tobacco-- never ETOH-- occasionally wine exercise-- + 3/week diet--  healthy   Physical Exam  General:  alert, well-developed, and well-nourished.   Abdomen:  soft, non-tender, no distention, no masses, no guarding, and no rigidity.   no inguinal hernias , the right suprapubic abdominal wall seems slightly weaker than the left ( during the Valsalva maneuver I can feel some pressure/bulging (?) w/o clear cut hernia).No lymphadenopathies on either  groin  Msk:  no tender to palpation at back  Pulses:   normal femur pulses bilaterally Extremities:   range of motion of the hips is symmetric and without pain. She is nontender at the trochanteric bursa area   Impression & Recommendations:  Problem # 1:  INGUINAL PAIN (ICD-789.09)  inguinal pain, review of systems negative, unclear etiology.  hernia ?..... See physical exam We discussed with the patient observation versus referral to surgery. We agreed on observation. If the pain gets worse or if she is not completely well in 2 months she will let me know Her updated medication list for this problem includes:    Aspir-low 81 Mg Tbec (Aspirin) ..... Qd  Complete Medication List: 1)  Simvastatin 20 Mg Tabs (Simvastatin) .... Take one tablet every day. 2)  Flonase 50 Mcg/act Susp (Fluticasone propionate) .... Prn 3)  Otc Allergy Med  .... As needed 4)  Fish Oil--vit D--ca--mvi-- Glucosamine  5)  Golden Na  6)  Aspir-low 81 Mg Tbec (Aspirin) .... Qd 7)  Boniva 150 Mg Tabs (Ibandronate sodium) .Marland Kitchen.. 1 by mouth qmo 8)  Nitrostat 0.4 Mg Subl (Nitroglycerin) .... One sublingual every 5 minutes x3 if chest pain  CHF Assessment/Plan:      The patient's current weight is 155.38 pounds.  Her previous weight was 154 pounds.    Patient Instructions: 1)    ]  Orders Added: 1)  Est. Patient Level III [20254]

## 2010-06-14 ENCOUNTER — Other Ambulatory Visit: Payer: Self-pay | Admitting: Internal Medicine

## 2010-07-06 ENCOUNTER — Encounter: Payer: Self-pay | Admitting: Internal Medicine

## 2010-07-09 ENCOUNTER — Encounter: Payer: Self-pay | Admitting: Internal Medicine

## 2010-07-09 ENCOUNTER — Ambulatory Visit (INDEPENDENT_AMBULATORY_CARE_PROVIDER_SITE_OTHER): Payer: BC Managed Care – PPO | Admitting: Internal Medicine

## 2010-07-09 DIAGNOSIS — R109 Unspecified abdominal pain: Secondary | ICD-10-CM

## 2010-07-09 DIAGNOSIS — M810 Age-related osteoporosis without current pathological fracture: Secondary | ICD-10-CM

## 2010-07-09 LAB — POCT URINALYSIS DIPSTICK
Ketones, UA: NEGATIVE
Leukocytes, UA: NEGATIVE
Nitrite, UA: NEGATIVE
Protein, UA: NEGATIVE
Urobilinogen, UA: 0.2

## 2010-07-09 MED ORDER — IBANDRONATE SODIUM 150 MG PO TABS: 150.0000 mg | ORAL_TABLET | ORAL | Status: DC

## 2010-07-09 NOTE — Patient Instructions (Signed)
Will schedule for CAT scan Please contact Dr. Madilyn Fireman for a colonoscopy

## 2010-07-09 NOTE — Assessment & Plan Note (Signed)
RF Boniva

## 2010-07-09 NOTE — Assessment & Plan Note (Addendum)
Several months history of inguinal pain. U dip neg Plan: Abdomen and pelvic CT to rule out a intra-abdominal etiology. She is due for a colonoscopy, encourage to call Dr. Madilyn Fireman. If CT is negative, at the end  this may be osteoarthritis of the hip or a musculoskeletal issue.

## 2010-07-09 NOTE — Progress Notes (Signed)
  Subjective:    Patient ID: Alice Howard, female    DOB: [birth year], 65 y.o.   MRN: [id number]  HPI Continue with pain at the right groin. Pain is present at rest or when walking or standing, not particularly exacerbated with any posture. Cholesterol, good medication compliance. She remains very active.  Past Medical History  Diagnosis Date  . Hyperlipidemia   . Osteoporosis   . History of colonic polyps     multiple Cscopes,  . Headache   . Allergic rhinitis   . OA (osteoarthritis)     sees Ortho, 2011 they talked about TKR L  . Baker's cyst     at the L per MRI ordered by ortho aprox 2008 patient thinks is B   Past Surgical History  Procedure Date  . Hemorrhoid surgery 1984  . Tubal ligation     Review of Systems No fever, nausea, vomiting or diarrhea. No blood in the stools No dysuria hematuria No chest pain or shortness of breath Mild tenderness at the right lower back.     Objective:   Physical Exam  Constitutional: She appears well-developed and well-nourished.  Cardiovascular: Normal rate, regular rhythm, normal heart sounds and intact distal pulses.   No murmur heard. Pulmonary/Chest: Effort normal and breath sounds normal. No respiratory distress. She has no wheezes. She has no rales.  Abdominal:       Soft, nontender, nondistended, no organomegaly. Area of pain is free of mass, rash. No CVA tenderness. No inguinal hernias  Musculoskeletal:       Nontender to palpation and a lower back. range of motion of the hip slightly limited but otherwise normal. Pain is not reproduced by rotating the hip          Assessment & Plan:

## 2010-07-10 LAB — CBC WITH DIFFERENTIAL/PLATELET
Basophils Relative: 0.4 % (ref 0.0–3.0)
Eosinophils Absolute: 0.1 10*3/uL (ref 0.0–0.7)
Eosinophils Relative: 2.1 % (ref 0.0–5.0)
Hemoglobin: 13.3 g/dL (ref 12.0–15.0)
MCHC: 34.4 g/dL (ref 30.0–36.0)
MCV: 94.8 fl (ref 78.0–100.0)
Monocytes Absolute: 0.4 10*3/uL (ref 0.1–1.0)
Neutro Abs: 3 10*3/uL (ref 1.4–7.7)
RBC: 4.07 Mil/uL (ref 3.87–5.11)
WBC: 5.6 10*3/uL (ref 4.5–10.5)

## 2010-07-10 LAB — BASIC METABOLIC PANEL
BUN: 17 mg/dL (ref 6–23)
Chloride: 105 mEq/L (ref 96–112)
Sodium: 142 mEq/L (ref 135–145)

## 2010-07-13 ENCOUNTER — Ambulatory Visit (INDEPENDENT_AMBULATORY_CARE_PROVIDER_SITE_OTHER)
Admission: RE | Admit: 2010-07-13 | Discharge: 2010-07-13 | Disposition: A | Discharge status: Home / Self Care | Payer: BC Managed Care – PPO | Source: Ambulatory Visit | Attending: Internal Medicine | Admitting: Internal Medicine

## 2010-07-13 DIAGNOSIS — R109 Unspecified abdominal pain: Secondary | ICD-10-CM

## 2010-07-13 MED ORDER — IOHEXOL 300 MG/ML  SOLN
100.0000 mL | Freq: Once | INTRAMUSCULAR | Status: AC | PRN
  Administered 2010-07-13: 100 mL via INTRAVENOUS

## 2010-07-16 ENCOUNTER — Telehealth: Payer: Self-pay | Admitting: *Deleted

## 2010-07-16 DIAGNOSIS — R1030 Lower abdominal pain, unspecified: Secondary | ICD-10-CM

## 2010-07-16 NOTE — Telephone Encounter (Signed)
Message left for patient to return my call.  

## 2010-07-16 NOTE — Telephone Encounter (Signed)
Pt is aware.  

## 2010-07-16 NOTE — Telephone Encounter (Signed)
Message copied by Army Fossa on Mon Jul 16, 2010  9:59 AM ------      Message from: Willow Ora      Created: Sat Jul 14, 2010  1:53 PM       Advise patient      CT is essentially normal, nothing to explain the inguinal pain. I think is a musculoskeletal issue, please refer to orthopedic surgery.

## 2010-07-27 NOTE — Assessment & Plan Note (Signed)
[facility]                          [place] OFFICE NOTE   Alice Howard, Alice Howard                       MRN:          [id number]  DATE:10/22/2005                            DOB:          08-Sep-1945    CHIEF COMPLAINT:  Sore in the bottom.   HISTORY OF THE PRESENT ILLNESS:  Alice Howard is a 65 year old white female  who came to the office because about a couple of weeks ago she noticed  bump on her left buttock; this apparently is getting bigger.  She applied  over-the-counter Neosporin without much help.  It does not hurt and has not  noticed any discharge.  She has not noticed fever either.   PAST MEDICAL HISTORY:  1. High cholesterol.  2. Osteoporosis.  She has never taken any hormones.  3. History of colon polyps.  4. Hemorrhoidal surgery in 1984.  5  Status post left knee surgery with 2 screws back in 1994.  1. She is G2, P2.  2. Status post bilateral tubal ligation.  3. History of several colonoscopies according to the patient; the next is      due in 2009.  Her GI doctor is Dr. Madilyn Fireman.   FAMILY HISTORY:  1. Mother had colon cancer and hypertension.  2. Father has myelometaplasia.  3. No history of breast cancer.  4. Grandmother had a history of heart disease; she had in fact an MI at 63      years old.   SOCIAL HISTORY:  Does not smoke.  Drinks socially, mostly wine.  She is  married and has 2 children.   MEDICATIONS:  1. Simvastatin 20.  2. Fosamax.   REVIEW OF SYSTEMS:  Please see the history of the present illness.   ALLERGIES:  No known drug allergies.   PHYSICAL EXAM:  GENERAL:  The patient is alert, oriented, in no apparent  distress.  VITAL SIGNS:  Temperature is 100.2, blood pressure 120/70, weight 153.6  pounds.  LUNGS:  Clear to auscultation bilaterally.  CARDIOVASCULAR:  Regular rate and rhythm without a murmur.  SKIN:  The left buttock has a superficial scab which is 2 x 3 mm surrounded  by some  erythematous skin.  I feel no fluctuance or evidence of an abscess.   ASSESSMENT AND PLAN:  1. The patient has a small lesion at the buttock, probably a resolving      boil.  At this point, I recommended using over-the-counter antibiotic      ointment mixed with hydrocortisone, also doxycycline 100 mg one p.o.      b.i.d. for 1 week.  She is aware that this may cause photo-reaction, so      she is to avoid excessive sun exposure.  She is to let me know if she      is not better.  2. She does not have a primary care doctor at this time and I encouraged      her to come back at her convenience for a routine checkup.  Alice Ora, MD   JP/MedQ  DD:  10/22/2005  DT:  10/23/2005  Job #:  [id number]

## 2010-09-17 ENCOUNTER — Other Ambulatory Visit: Payer: Self-pay | Admitting: Internal Medicine

## 2010-10-22 ENCOUNTER — Ambulatory Visit (INDEPENDENT_AMBULATORY_CARE_PROVIDER_SITE_OTHER): Payer: BC Managed Care – PPO | Admitting: Internal Medicine

## 2010-10-22 ENCOUNTER — Encounter: Payer: Self-pay | Admitting: Internal Medicine

## 2010-10-22 ENCOUNTER — Other Ambulatory Visit (HOSPITAL_COMMUNITY)
Admission: RE | Admit: 2010-10-22 | Discharge: 2010-10-22 | Disposition: A | Discharge status: Home / Self Care | Payer: BC Managed Care – PPO | Source: Ambulatory Visit | Attending: Internal Medicine | Admitting: Internal Medicine

## 2010-10-22 DIAGNOSIS — Z01419 Encounter for gynecological examination (general) (routine) without abnormal findings: Secondary | ICD-10-CM | POA: Insufficient documentation

## 2010-10-22 DIAGNOSIS — E785 Hyperlipidemia, unspecified: Secondary | ICD-10-CM

## 2010-10-22 DIAGNOSIS — M810 Age-related osteoporosis without current pathological fracture: Secondary | ICD-10-CM

## 2010-10-22 DIAGNOSIS — Z0000 Encounter for general adult medical examination without abnormal findings: Secondary | ICD-10-CM | POA: Insufficient documentation

## 2010-10-22 DIAGNOSIS — R109 Unspecified abdominal pain: Secondary | ICD-10-CM

## 2010-10-22 MED ORDER — ZOSTER VACCINE LIVE 19400 UNT/0.65ML ~~LOC~~ SOLR: 0.6500 mL | Freq: Once | SUBCUTANEOUS | Status: DC

## 2010-10-22 NOTE — Assessment & Plan Note (Signed)
Continue with same symptoms, recent CT negative. Or tho evaluation concluded pain was not related to DJD. Offered a surgical referral, patient declined. Will observe for now

## 2010-10-22 NOTE — Assessment & Plan Note (Addendum)
Td 07 zostavax Rx provided  UTD on Cscope , next 2012 (Dr Madilyn Fireman) Pap sent Mammogram  06/2010 negative labs continue self breast exam Continue with her healthy lifestyle, diet-exercise discussed

## 2010-10-22 NOTE — Assessment & Plan Note (Addendum)
2009:  L1-L4 T Score in 2009 was -2.6 06-2009 T score -2.4 Continue present care, vitamin D last year normal.

## 2010-10-22 NOTE — Patient Instructions (Signed)
Come back fasting at your convenience colon FLP, AST, ALT, TSH---d x V70

## 2010-10-22 NOTE — Progress Notes (Signed)
  Subjective:    Patient ID: Alice Howard, female    DOB: 05/05/1945, 65 y.o.   MRN: [id number]  HPI CPX Was seen  Recently w/ R sided groin pain, CT was negative; later he saw ortho, they examined the pt, pain not though to be hip DJD   Past Medical History  Diagnosis Date  . Hyperlipidemia   . Osteoporosis   . History of colonic polyps     multiple Cscopes,  . Headache   . Allergic rhinitis   . OA (osteoarthritis)     sees Ortho, 2011 they talked about TKR L  . Baker's cyst     at the L per MRI ordered by ortho aprox 2008 patient thinks is B    Past Surgical History  Procedure Date  . Hemorrhoid surgery 1984  . 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    Objective:   Physical Exam  Constitutional: She is oriented to person, place, and time. She appears well-developed and well-nourished. No distress.  HENT:  Head: Normocephalic and atraumatic.       L>R wax, very narrow canals  Neck: No thyromegaly present.  Cardiovascular: Normal rate, regular rhythm and normal heart sounds.   No murmur heard. Pulmonary/Chest: Effort normal and breath sounds normal. No respiratory distress. She has no wheezes. She has no rales. She exhibits no tenderness.  Abdominal: Bowel sounds are normal. She exhibits no distension. There is no rebound and no guarding.       Slightly tender at the distal right lower quadrant, no mass.  Genitourinary:       Breast exam normal bilaterally, no lymphadenopathies. GU External  normal Internal: Normal vagina, no discharge or lesions. Cervix normal to inspection. PAP taken. Bimanual exam without mass or discomfort  Musculoskeletal: She exhibits no edema.  Neurological: She is alert and oriented to person, place, and time.  Skin: She is not diaphoretic.  Psychiatric: She has a normal mood and affect. Her behavior is normal. Judgment and thought content normal.          Assessment & Plan:

## 2010-10-22 NOTE — Assessment & Plan Note (Signed)
Due for labs

## 2010-10-24 ENCOUNTER — Other Ambulatory Visit: Payer: Self-pay | Admitting: Internal Medicine

## 2010-10-24 DIAGNOSIS — Z0000 Encounter for general adult medical examination without abnormal findings: Secondary | ICD-10-CM

## 2010-10-25 ENCOUNTER — Other Ambulatory Visit (INDEPENDENT_AMBULATORY_CARE_PROVIDER_SITE_OTHER): Payer: BC Managed Care – PPO

## 2010-10-25 DIAGNOSIS — Z0000 Encounter for general adult medical examination without abnormal findings: Secondary | ICD-10-CM

## 2010-10-25 LAB — LIPID PANEL
HDL: 63.7 mg/dL (ref >39.00)
Total CHOL/HDL Ratio: 3
VLDL: 17 mg/dL (ref 0.0–40.0)

## 2010-10-25 LAB — TSH: TSH: 0.73 u[IU]/mL (ref 0.35–5.50)

## 2010-10-25 LAB — ALT: ALT: 24 U/L (ref 0–35)

## 2010-10-25 LAB — AST: AST: 27 U/L (ref 0–37)

## 2010-10-25 NOTE — Progress Notes (Signed)
Labs only

## 2010-11-13 ENCOUNTER — Telehealth: Payer: Self-pay | Admitting: *Deleted

## 2010-11-13 MED ORDER — MECLIZINE HCL 12.5 MG PO TABS: 12.5000 mg | ORAL_TABLET | Freq: Four times a day (QID) | ORAL | Status: AC | PRN

## 2010-11-13 NOTE — Telephone Encounter (Signed)
Call in Antivert 12.5 mg one by mouth every 6 hours. #30, no refills. If she has any worrisome symptoms like face paralyzed, double vision,  Headache: needs to go to the ER or at least let us know.

## 2010-11-13 NOTE — Telephone Encounter (Signed)
Patient c/o Vertigo [no nausea/vomiting] and would like to know if you would call her in a Rx to pharmacy for this, as she does not have transportation to come in to office.?

## 2010-11-13 NOTE — Telephone Encounter (Signed)
Rx Done. Pt informed. 

## 2010-11-15 ENCOUNTER — Ambulatory Visit (INDEPENDENT_AMBULATORY_CARE_PROVIDER_SITE_OTHER): Payer: BC Managed Care – PPO | Admitting: Family Medicine

## 2010-11-15 ENCOUNTER — Encounter: Payer: Self-pay | Admitting: Family Medicine

## 2010-11-15 VITALS — BP 132/80 | HR 79 | Temp 98.7°F | Wt 151.4 lb

## 2010-11-15 DIAGNOSIS — J329 Chronic sinusitis, unspecified: Secondary | ICD-10-CM

## 2010-11-15 MED ORDER — FLUTICASONE PROPIONATE 50 MCG/ACT NA SUSP: 2.0000 | Freq: Every day | NASAL | Status: DC | PRN

## 2010-11-15 MED ORDER — CEFUROXIME AXETIL 500 MG PO TABS: 500.0000 mg | ORAL_TABLET | Freq: Two times a day (BID) | ORAL | Status: AC

## 2010-11-15 NOTE — Patient Instructions (Signed)

## 2010-11-15 NOTE — Progress Notes (Signed)
  Subjective:     Alice Howard is a 65 y.o. female who presents for evaluation of sinus pain. Symptoms include: congestion, nasal congestion, post nasal drip, sinus pressure and nausea. Onset of symptoms was 6 days ago. Symptoms have been gradually worsening since that time. Past history is significant for no history of pneumonia or bronchitis. Patient is a non-smoker.  The following portions of the patient's history were reviewed and updated as appropriate: allergies, current medications, past family history, past medical history, past social history, past surgical history and problem list.  Review of Systems Pertinent items are noted in HPI.   Objective:    BP 132/80  Pulse 79  Temp(Src) 98.7 F (37.1 C) (Oral)  Wt 151 lb 6.4 oz (68.675 kg)  SpO2 97% General appearance: alert, cooperative, appears stated age and no distress Ears: normal TM's and external ear canals both ears Nose: green discharge, moderate congestion, sinus tenderness bilateral Throat: abnormal findings: mild oropharyngeal erythema and PND Neck: mild anterior cervical adenopathy and thyroid not enlarged, symmetric, no tenderness/mass/nodules Lungs: clear to auscultation bilaterally Heart: S1, S2 normal Extremities: extremities normal, atraumatic, no cyanosis or edema    Assessment:    Acute bacterial sinusitis.    Plan:    Nasal steroids per medication orders. Ceftin per medication orders. Follow up in several days or as needed.  Antihistamine and steroid nasal spray

## 2010-12-18 ENCOUNTER — Telehealth: Payer: Self-pay

## 2010-12-18 DIAGNOSIS — J329 Chronic sinusitis, unspecified: Secondary | ICD-10-CM

## 2010-12-18 NOTE — Telephone Encounter (Signed)
Atypical infection? Advise patient:  will call in a Z-Pak, refill Flonase, office visit in one week if not better

## 2010-12-18 NOTE — Telephone Encounter (Signed)
Pt c/o same sinus inf sxs that she had in early September are coming back. She saw Dr. Laury Axon on 11/15/10 and Rx'd ceftin and flonase and advised to follow up in a few days or prn. Pt would like to know if she needs another round of abx or another OV

## 2010-12-19 MED ORDER — AZITHROMYCIN 250 MG PO TABS: ORAL_TABLET | ORAL | Status: AC

## 2010-12-19 MED ORDER — FLUTICASONE PROPIONATE 50 MCG/ACT NA SUSP: 2.0000 | Freq: Every day | NASAL | Status: DC | PRN

## 2010-12-19 NOTE — Telephone Encounter (Signed)
LMOM to inform patient Rx[s] sent to pharmacy, call for appointment if no better in one week.

## 2011-01-01 ENCOUNTER — Ambulatory Visit (INDEPENDENT_AMBULATORY_CARE_PROVIDER_SITE_OTHER): Payer: BC Managed Care – PPO | Admitting: Internal Medicine

## 2011-01-01 ENCOUNTER — Encounter: Payer: Self-pay | Admitting: Internal Medicine

## 2011-01-01 VITALS — BP 110/80 | HR 75 | Temp 98.6°F | Ht 63.5 in | Wt 154.0 lb

## 2011-01-01 DIAGNOSIS — J309 Allergic rhinitis, unspecified: Secondary | ICD-10-CM

## 2011-01-01 DIAGNOSIS — Z23 Encounter for immunization: Secondary | ICD-10-CM

## 2011-01-01 DIAGNOSIS — R42 Dizziness and giddiness: Secondary | ICD-10-CM | POA: Insufficient documentation

## 2011-01-01 NOTE — Assessment & Plan Note (Signed)
Mild persistent sx, rec OTC antihistaminic, saline lavages. (had some nasal bleed w/ steroids)

## 2011-01-01 NOTE — Progress Notes (Signed)
  Subjective:    Patient ID: Alice Howard, female    DOB: 1945-04-04, 65 y.o.   MRN: [id number]  HPI Developed severe dizziness 11-2010, described as spinning, associated with nausea. Was seen here with those symptoms along with some sinus congestion. Status post to antibiotics. Mild sinus congestion is still there but better, used Flonase as developed nasal bleeding so she stopped. Dizziness is better, less frequent but not completely gone. Typically, she gets very dizzy when she is in bed and she turns. Her main concern today is dizziness.  Past Medical History  Diagnosis Date  . Hyperlipidemia   . Osteoporosis   . History of colonic polyps     multiple Cscopes,  . Headache   . Allergic rhinitis   . OA (osteoarthritis)     sees Ortho, 2011 they talked about TKR L  . Baker's cyst     at the L per MRI ordered by ortho aprox 2008 patient thinks is B   Past Surgical History  Procedure Date  . Hemorrhoid surgery 1984  . Tubal ligation      Review of Systems  no chest pain or shortness of breath No palpitations No loss of consciousness, slurred speech or motor deficits.     Objective:   Physical Exam  Constitutional: She appears well-developed and well-nourished.  HENT:  Head: Normocephalic and atraumatic.       Very narrow canals, very small amount of cerumen, TMs seems normal  Eyes: Pupils are equal, round, and reactive to light.  Neck:       Normal carotid pulses  Cardiovascular: Normal rate, regular rhythm and normal heart sounds.   No murmur heard. Pulmonary/Chest: Effort normal and breath sounds normal. No respiratory distress. She has no wheezes. She has no rales.  Neurological:       Motor gait and speech normal          Assessment & Plan:

## 2011-01-01 NOTE — Assessment & Plan Note (Signed)
Sx c/w BPV, resolving , will call if no better soon for ENT referal

## 2011-01-27 ENCOUNTER — Other Ambulatory Visit: Payer: Self-pay | Admitting: Internal Medicine

## 2011-03-07 ENCOUNTER — Telehealth: Payer: Self-pay | Admitting: Internal Medicine

## 2011-03-07 DIAGNOSIS — R42 Dizziness and giddiness: Secondary | ICD-10-CM

## 2011-03-07 NOTE — Telephone Encounter (Signed)
Patient wants to be referred to ent - she is still having problem with her ear

## 2011-03-07 NOTE — Telephone Encounter (Signed)
Seen 01/01/11 for Vertigo still having problems. Please advise    KP

## 2011-03-08 NOTE — Telephone Encounter (Signed)
Referral entered, let pt know

## 2011-03-08 NOTE — Telephone Encounter (Signed)
Patient made aware    KP 

## 2011-03-14 ENCOUNTER — Other Ambulatory Visit: Payer: Self-pay | Admitting: Internal Medicine

## 2011-03-14 DIAGNOSIS — Z1231 Encounter for screening mammogram for malignant neoplasm of breast: Secondary | ICD-10-CM

## 2011-04-08 ENCOUNTER — Ambulatory Visit (HOSPITAL_BASED_OUTPATIENT_CLINIC_OR_DEPARTMENT_OTHER)
Admission: RE | Admit: 2011-04-08 | Discharge: 2011-04-08 | Disposition: A | Discharge status: Home / Self Care | Payer: BC Managed Care – PPO | Source: Ambulatory Visit | Attending: Internal Medicine | Admitting: Internal Medicine

## 2011-04-08 DIAGNOSIS — Z1231 Encounter for screening mammogram for malignant neoplasm of breast: Secondary | ICD-10-CM

## 2011-05-02 ENCOUNTER — Other Ambulatory Visit: Payer: Self-pay | Admitting: Internal Medicine

## 2011-05-03 NOTE — Telephone Encounter (Signed)
Refill done.  

## 2011-07-17 ENCOUNTER — Other Ambulatory Visit: Payer: Self-pay | Admitting: Internal Medicine

## 2011-07-17 NOTE — Telephone Encounter (Signed)
Refill done.  

## 2011-08-12 ENCOUNTER — Telehealth: Payer: Self-pay | Admitting: Medical Oncology

## 2011-08-12 ENCOUNTER — Telehealth: Payer: Self-pay | Admitting: Genetic Counselor

## 2011-08-12 NOTE — Telephone Encounter (Signed)
Received call from patient inquiring about a genetic colon cancer screening test.  Will forward to genetic counselor.

## 2011-08-12 NOTE — Telephone Encounter (Signed)
Talked with patient about her family history of colon cancer.  We discussed coming in and discussing her fmaily history in more detail.  It does not appear that she meets [place] Criteria for most insurance carriers determined coverage, but we could discuss a bit more in person.  Transferred to Tiffany to make an appointment.

## 2011-08-13 ENCOUNTER — Telehealth: Payer: Self-pay | Admitting: Genetic Counselor

## 2011-08-13 NOTE — Telephone Encounter (Signed)
Pt called in yesterday requesting an appt w/karen powell but hung up while on hold. Called pt back today and lm for pt to call me if still interesting in scheduling genetics appt. Pt was not referred by anyone but was forwarded to Tiffany from Clydie Braun to schedule appt. Tiffany in turn forwarded pt to me.

## 2011-11-21 ENCOUNTER — Ambulatory Visit (INDEPENDENT_AMBULATORY_CARE_PROVIDER_SITE_OTHER): Payer: BC Managed Care – PPO | Admitting: Internal Medicine

## 2011-11-21 ENCOUNTER — Encounter: Payer: Self-pay | Admitting: Internal Medicine

## 2011-11-21 VITALS — BP 110/80 | HR 72 | Temp 98.0°F | Resp 16 | Ht 64.0 in | Wt 156.2 lb

## 2011-11-21 DIAGNOSIS — Z0000 Encounter for general adult medical examination without abnormal findings: Secondary | ICD-10-CM

## 2011-11-21 DIAGNOSIS — M810 Age-related osteoporosis without current pathological fracture: Secondary | ICD-10-CM

## 2011-11-21 DIAGNOSIS — Z23 Encounter for immunization: Secondary | ICD-10-CM

## 2011-11-21 DIAGNOSIS — R109 Unspecified abdominal pain: Secondary | ICD-10-CM

## 2011-11-21 DIAGNOSIS — R42 Dizziness and giddiness: Secondary | ICD-10-CM

## 2011-11-21 LAB — COMPREHENSIVE METABOLIC PANEL
Albumin: 4.2 g/dL (ref 3.5–5.2)
Alkaline Phosphatase: 46 U/L (ref 39–117)
BUN: 14 mg/dL (ref 6–23)
CO2: 28 mEq/L (ref 19–32)
Calcium: 9.1 mg/dL (ref 8.4–10.5)
GFR: 83.85 mL/min (ref >60.00)
Glucose, Bld: 100 mg/dL — ABNORMAL HIGH (ref 70–99)
Potassium: 4.5 mEq/L (ref 3.5–5.1)
Total Protein: 7.3 g/dL (ref 6.0–8.3)

## 2011-11-21 LAB — CBC WITH DIFFERENTIAL/PLATELET
Basophils Relative: 0.5 % (ref 0.0–3.0)
Eosinophils Relative: 1.3 % (ref 0.0–5.0)
Lymphocytes Relative: 29.7 % (ref 12.0–46.0)
MCV: 95.7 fl (ref 78.0–100.0)
Monocytes Relative: 8 % (ref 3.0–12.0)
Neutrophils Relative %: 60.5 % (ref 43.0–77.0)
Platelets: 174 10*3/uL (ref 150.0–400.0)
RBC: 4.15 Mil/uL (ref 3.87–5.11)
WBC: 5.9 10*3/uL (ref 4.5–10.5)

## 2011-11-21 LAB — LIPID PANEL
Cholesterol: 208 mg/dL — ABNORMAL HIGH (ref 0–200)
HDL: 59.4 mg/dL (ref >39.00)
Total CHOL/HDL Ratio: 4
Triglycerides: 82 mg/dL (ref 0.0–149.0)
VLDL: 16.4 mg/dL (ref 0.0–40.0)

## 2011-11-21 NOTE — Assessment & Plan Note (Signed)
Still an issue on and off. Recommend daily Flonase If symptoms continue, recommend to see Dr. Annalee Genta, ENT, again.

## 2011-11-21 NOTE — Assessment & Plan Note (Addendum)
R groin -distal RLQ pain still an issue, previously an abdomen and pelvic CT was (-). Saw orthopedic surgery, they thought it was not related to DJD of the hip. She is due for a colonoscopy 02-2012 Pelvic exam today (-) Discussed referral to surgery: hernia?  Will arrange Also may need a pelvic u/s

## 2011-11-21 NOTE — Assessment & Plan Note (Signed)
Cont boniva, ordering a DEXA

## 2011-11-21 NOTE — Progress Notes (Signed)
  Subjective:    Patient ID: Alice Howard, female    DOB: 1945-07-11, 66 y.o.   MRN: [id number]  HPI Complete physical exam  Past Medical History  Diagnosis Date  . Hyperlipidemia   . Osteoporosis   . History of colonic polyps     multiple Cscopes,  . Headache   . Allergic rhinitis   . OA (osteoarthritis)     sees Ortho, 2011 they talked about TKR L  . Baker's cyst     at the L per MRI ordered by ortho aprox 2008 patient thinks is B   Past Surgical History  Procedure Date  . Hemorrhoid surgery 1984  . Tubal ligation   . Knee surgery 1994    due to skiing accident.  Has 2 screws in knee. Gets Euflexxa injections.    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  Continue with dizziness when she turns her head, happened at night, symptoms are not consistent. Flonase if use daily help. Symptoms are described as spinning. Status post ENT eval. Denies  tinnitus or decreased hearing No chest pain or shortness or breath No nausea, vomiting, diarrhea or blood in the stools No anxiety or depression No dysuria, gross hematuria or vaginal discharge.     Objective:   Physical Exam  Abdominal:     General -- alert, well-developed, and well-nourished.   Neck --no thyromegaly , normal carotid pulse Breasts-- No mass, nodules, thickening, tenderness, bulging, retraction, inflamation, nipple discharge or skin changes noted.  no axillary lymph nodes Lungs -- normal respiratory effort, no intercostal retractions, no accessory muscle use, and normal breath sounds.   Heart-- normal rate, regular rhythm, no murmur, and no gallop.  No inguinal  hernias    Abdomen--soft, non-tender, no distention, no masses, no HSM, no guarding, and no rigidity.   Extremities-- no pretibial edema bilaterally Rectal-- No external abnormalities noted. Normal sphincter tone. No rectal masses or tenderness. Brown stool, Hemoccult negative GU-- normal to inspection, bimanual w/o mass tenderness, no CMT, no d/c Neurologic-- alert & oriented X3 and strength normal in all extremities. Psych-- Cognition and judgment appear intact. Alert and cooperative with normal attention span and concentration.  not anxious appearing and not depressed appearing.       Assessment & Plan:

## 2011-11-21 NOTE — Assessment & Plan Note (Addendum)
Td 07 Had a zostavax pneumonia and flu shot today UTD on Cscope , next 02-2012 per GI (Dr Madilyn Fireman) Paps consistently normal, no need for  pap this year, pelvic exam  Done today  for groin pain evaluatioon,  see below  Mammogram  03-2011 negative, breast exam normal today labs continue self breast exam Continue with her healthy lifestyle, diet-exercise discussed

## 2011-11-22 LAB — VITAMIN D 25 HYDROXY (VIT D DEFICIENCY, FRACTURES): Vit D, 25-Hydroxy: 31 ng/mL (ref 30–89)

## 2011-11-25 ENCOUNTER — Encounter: Payer: Self-pay | Admitting: *Deleted

## 2011-11-25 ENCOUNTER — Telehealth: Payer: Self-pay | Admitting: Internal Medicine

## 2011-11-25 DIAGNOSIS — R1031 Right lower quadrant pain: Secondary | ICD-10-CM

## 2011-11-25 NOTE — Telephone Encounter (Signed)
My scheduler was told that general surgery does not see patients with the type of pain she has Plan: Schedule a pelvic ultrasound , DX chronic right lower abdominal pain

## 2011-11-26 NOTE — Telephone Encounter (Signed)
Orders entered

## 2011-11-28 NOTE — Addendum Note (Signed)
Addended by: Edwena Felty T on: 11/28/2011 10:28 AM   Modules accepted: Orders

## 2011-11-29 ENCOUNTER — Ambulatory Visit (HOSPITAL_COMMUNITY)
Admission: RE | Admit: 2011-11-29 | Discharge: 2011-11-29 | Disposition: A | Discharge status: Home / Self Care | Payer: BC Managed Care – PPO | Source: Ambulatory Visit | Attending: Internal Medicine | Admitting: Internal Medicine

## 2011-11-29 DIAGNOSIS — N949 Unspecified condition associated with female genital organs and menstrual cycle: Secondary | ICD-10-CM | POA: Insufficient documentation

## 2011-11-29 DIAGNOSIS — G8929 Other chronic pain: Secondary | ICD-10-CM

## 2011-11-29 DIAGNOSIS — R109 Unspecified abdominal pain: Secondary | ICD-10-CM | POA: Insufficient documentation

## 2011-12-19 ENCOUNTER — Telehealth: Payer: Self-pay | Admitting: Internal Medicine

## 2011-12-19 NOTE — Telephone Encounter (Signed)
Please advise 

## 2011-12-19 NOTE — Telephone Encounter (Signed)
Caller: [name]/Patient; Patient Name: Alice Howard; PCP: Willow Ora; Best Callback Phone Number: [phone]. Call regarding [place] exposure. Onset 12/13/11.  Pt using OTC topical and benadryl for relief.  Pt continues to have new outbreaks. All emergent symptoms ruled out per [place], Oak or [place] Exposure with exception to ' Symptoms are not improvming or are worsening after 3 days of home treatment'.  See Provider in 24 hrs.  Home care advice given.  Offered appt. Pt wants to try home care treatment and will call back for an appt.

## 2011-12-19 NOTE — Telephone Encounter (Signed)
thx

## 2012-01-02 ENCOUNTER — Telehealth: Payer: Self-pay | Admitting: Internal Medicine

## 2012-01-02 NOTE — Telephone Encounter (Signed)
Advise patient Bone density test has improved, her T score is -2.3 which is consider osteopenia Recommend to continue with Boniva, we'll discuss further or return to the office.

## 2012-01-03 ENCOUNTER — Encounter: Payer: Self-pay | Admitting: *Deleted

## 2012-01-03 NOTE — Telephone Encounter (Signed)
Mailed results  

## 2012-01-28 ENCOUNTER — Telehealth: Payer: Self-pay | Admitting: Internal Medicine

## 2012-01-28 NOTE — Telephone Encounter (Signed)
Patient Information:  Caller Name: [place]  Phone: [phone]  Patient: Alice Howard, Alice Howard  Gender: Female  DOB: 02/26/1946  Age: 66 Years  PCP: Willow Ora   Symptoms  Reason For Call & Symptoms: Needs to know date of last TB test performed. Needs for employment purposes. Nothing listed in EPIC.  Reviewed Health History In EMR: N/A  Reviewed Medications In EMR: N/A  Reviewed Allergies In EMR: N/A  Date of Onset of Symptoms: 01/28/2012  Guideline(s) Used:  No Protocol Available - Information Only  Disposition Per Guideline:   Home Care  Reason For Disposition Reached:   Information only question and nurse able to answer  Advice Given:  N/A  Office Follow Up:  Does the office need to follow up with this patient?: No  Instructions For The Office: N/A

## 2012-01-29 NOTE — Telephone Encounter (Signed)
Called pt & scheduled PPD for 11.25.13.

## 2012-01-29 NOTE — Telephone Encounter (Signed)
Please review EPIC, if no PPD found,  arrange a PPD if patient so desire

## 2012-02-03 ENCOUNTER — Ambulatory Visit (INDEPENDENT_AMBULATORY_CARE_PROVIDER_SITE_OTHER): Payer: BC Managed Care – PPO | Admitting: *Deleted

## 2012-02-03 DIAGNOSIS — Z111 Encounter for screening for respiratory tuberculosis: Secondary | ICD-10-CM

## 2012-02-05 ENCOUNTER — Encounter: Payer: Self-pay | Admitting: *Deleted

## 2012-02-05 LAB — TB SKIN TEST: Induration: 0 mm

## 2012-02-14 ENCOUNTER — Other Ambulatory Visit: Payer: Self-pay | Admitting: Gastroenterology

## 2012-03-24 ENCOUNTER — Encounter: Payer: Self-pay | Admitting: Internal Medicine

## 2012-03-25 ENCOUNTER — Other Ambulatory Visit: Payer: Self-pay | Admitting: Internal Medicine

## 2012-03-25 DIAGNOSIS — Z1239 Encounter for other screening for malignant neoplasm of breast: Secondary | ICD-10-CM

## 2012-04-10 ENCOUNTER — Inpatient Hospital Stay (HOSPITAL_BASED_OUTPATIENT_CLINIC_OR_DEPARTMENT_OTHER): Admission: RE | Admit: 2012-04-10 | Payer: BC Managed Care – PPO | Source: Ambulatory Visit

## 2012-04-16 ENCOUNTER — Ambulatory Visit (HOSPITAL_BASED_OUTPATIENT_CLINIC_OR_DEPARTMENT_OTHER)
Admission: RE | Admit: 2012-04-16 | Discharge: 2012-04-16 | Disposition: A | Discharge status: Home / Self Care | Payer: BC Managed Care – PPO | Source: Ambulatory Visit | Attending: Internal Medicine | Admitting: Internal Medicine

## 2012-04-16 DIAGNOSIS — Z1231 Encounter for screening mammogram for malignant neoplasm of breast: Secondary | ICD-10-CM | POA: Insufficient documentation

## 2012-04-16 DIAGNOSIS — Z1239 Encounter for other screening for malignant neoplasm of breast: Secondary | ICD-10-CM

## 2012-04-16 IMAGING — MG MM DIGITAL SCREENING
4 series · 4 of 4 positions shown · non-contrast
Comparison: Previous exams.

CLINICAL DATA: Screening.

DIGITAL BILATERAL SCREENING MAMMOGRAM WITH CAD

[R CC]
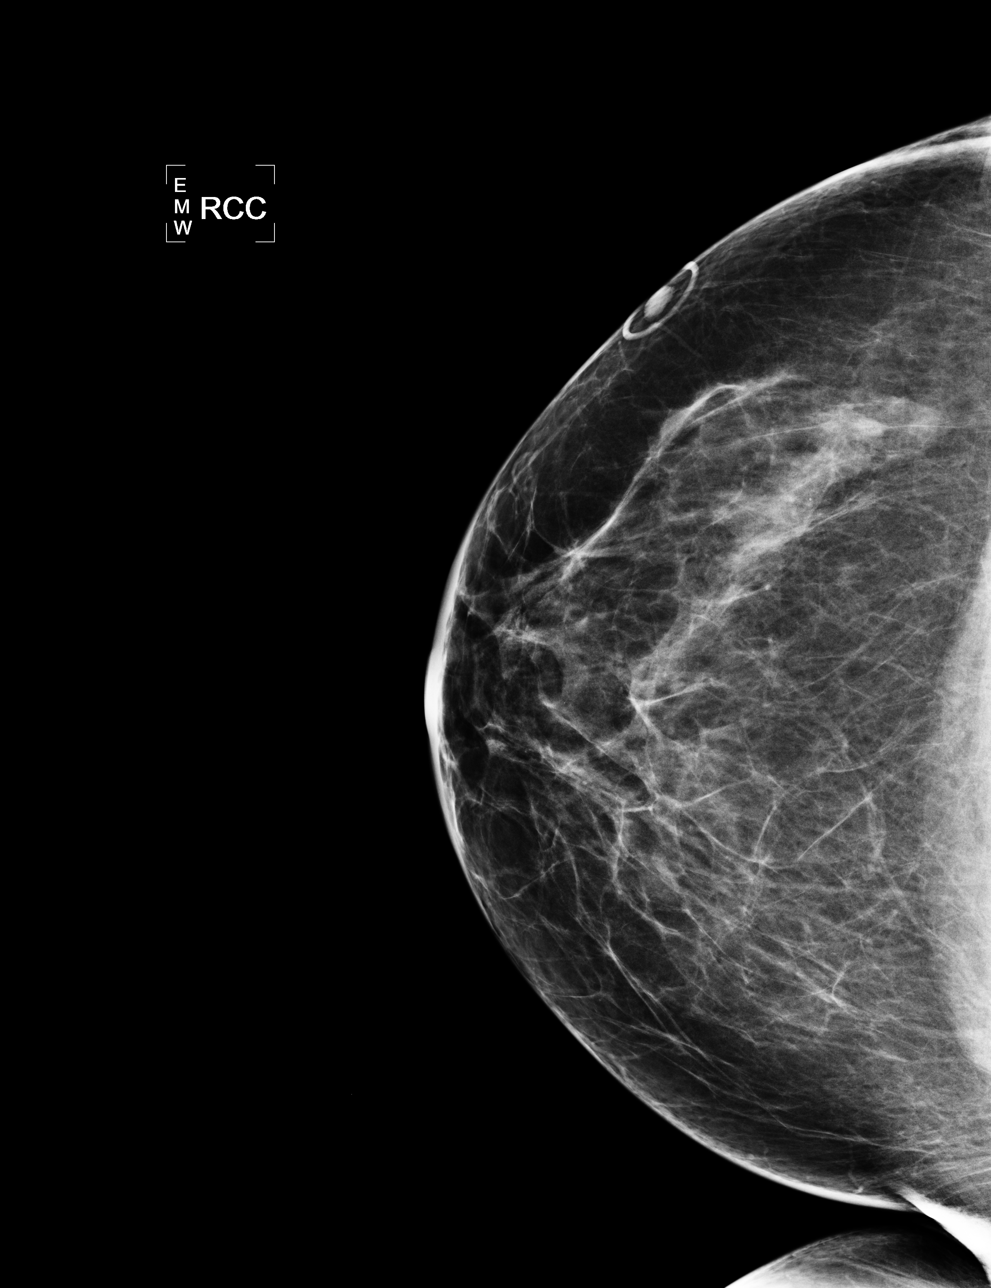

[L CC]
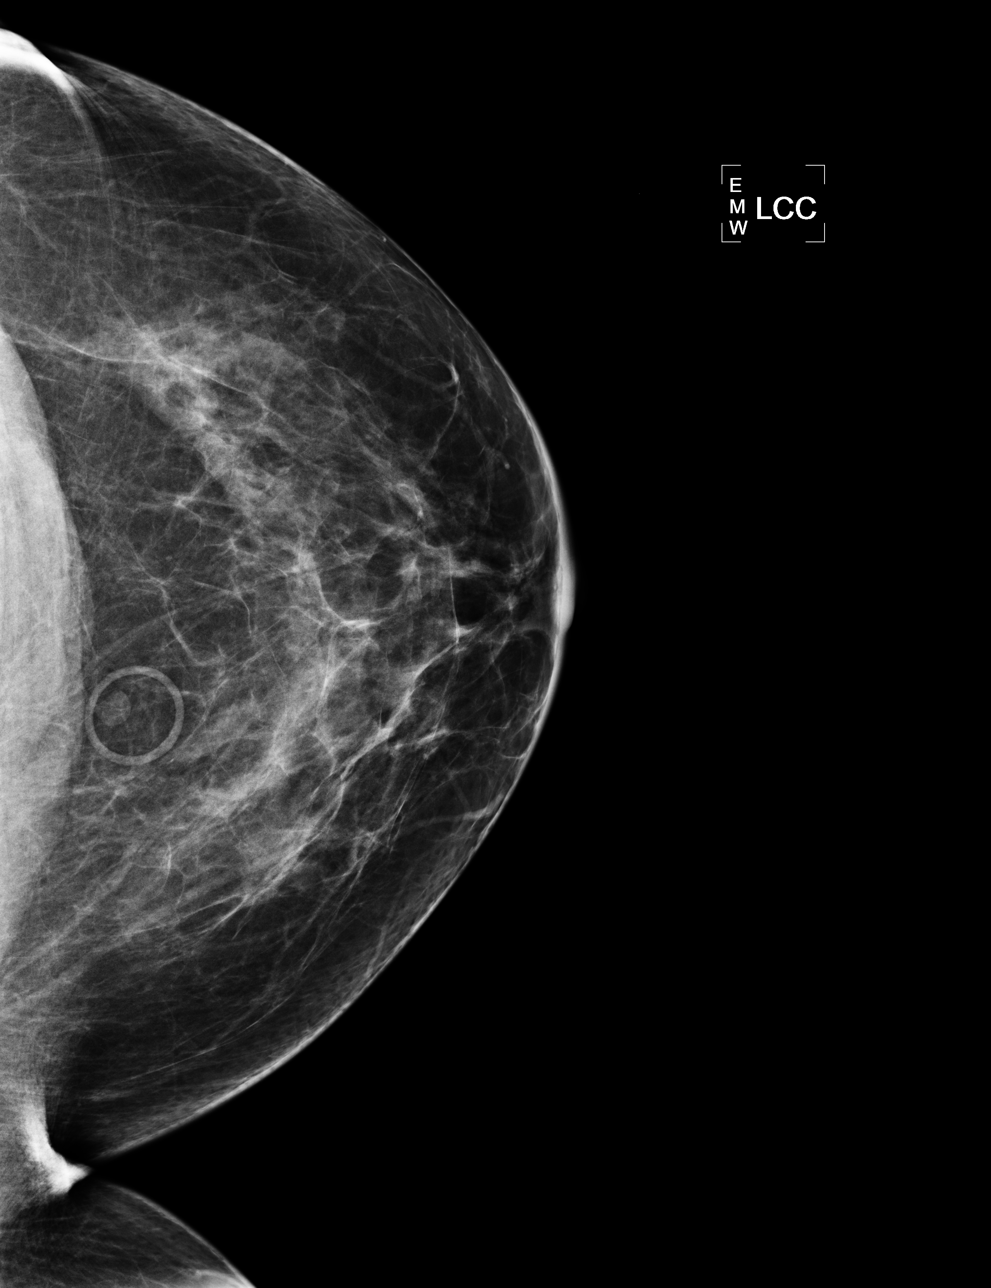

[L MLO]
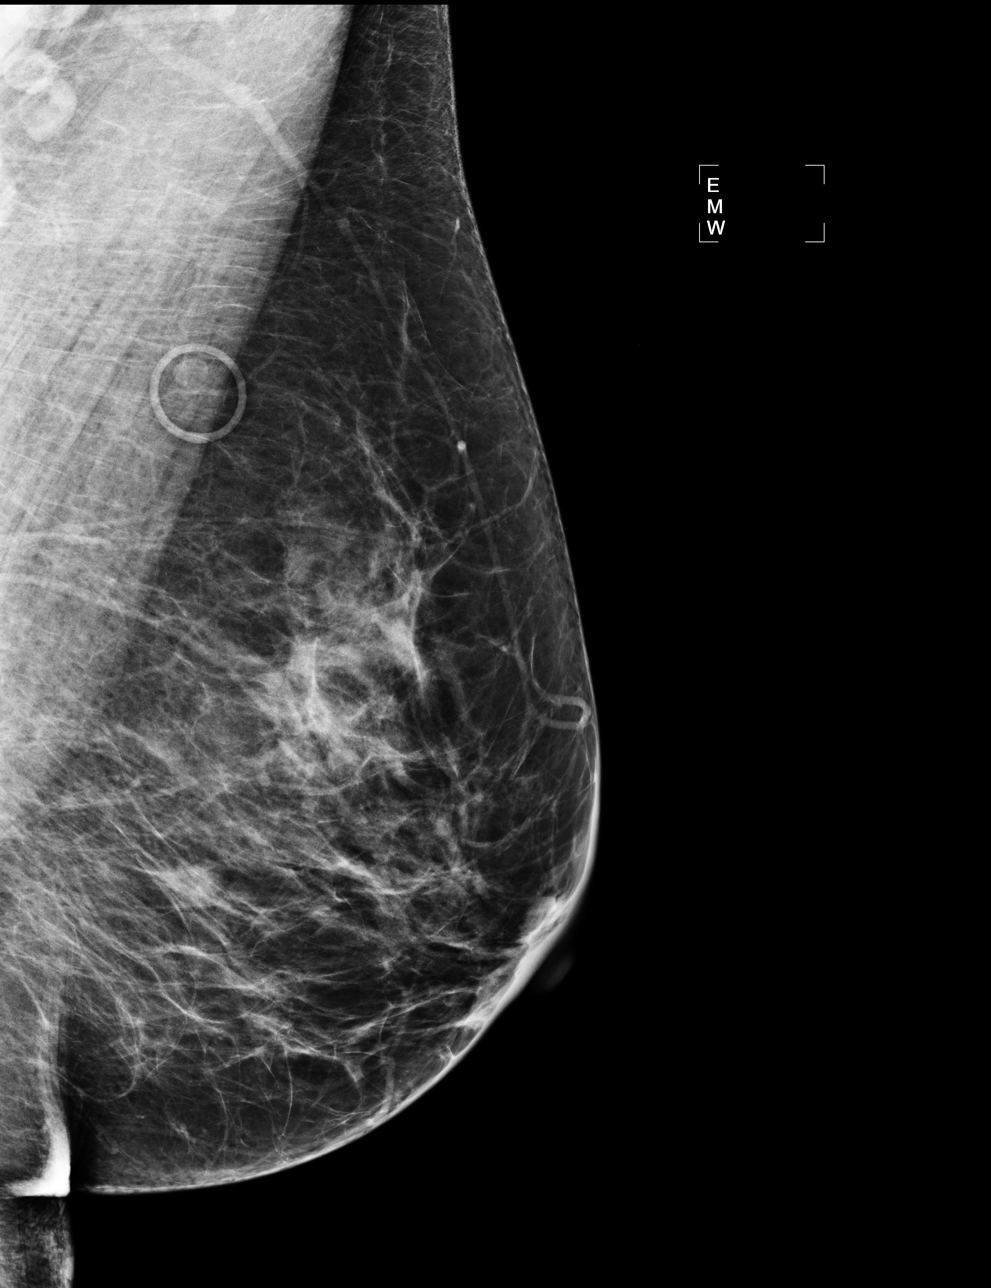

[R MLO]
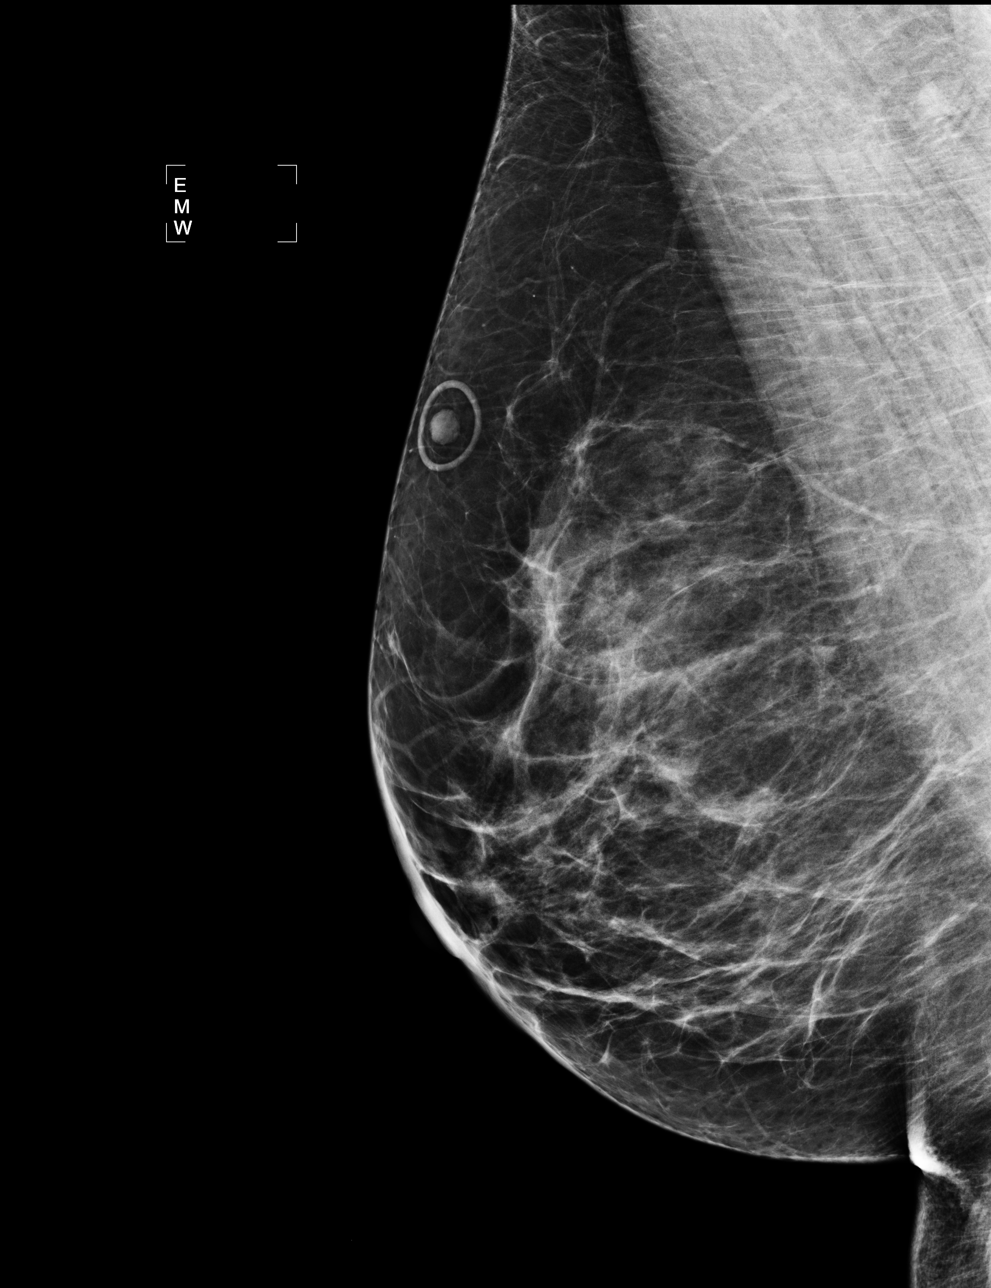

[4 of 4 positions shown; findings below may reference images not displayed]

FINDINGS: ACR Breast Density Category 2: There is a scattered fibroglandular
pattern.

No suspicious masses, architectural distortion, or calcifications
are present.

Images were processed with CAD.
IMPRESSION: No mammographic evidence of malignancy.

A result letter of this screening mammogram will be mailed directly
to the patient.

RECOMMENDATION:
Screening mammogram in one year. (Code:[FI])

BI-RADS CATEGORY 1:  Negative.

## 2012-05-04 ENCOUNTER — Telehealth: Payer: Self-pay | Admitting: Internal Medicine

## 2012-05-04 MED ORDER — FLUTICASONE PROPIONATE 50 MCG/ACT NA SUSP: 1.0000 | Freq: Every day | NASAL | Status: DC

## 2012-05-04 NOTE — Telephone Encounter (Signed)
Refill done.  

## 2012-05-04 NOTE — Telephone Encounter (Signed)
refill Fluticasone Nasal SP (120INH) #16, use 2 sprays in each nostril every night at bedtime last fill 6.12.13

## 2012-06-12 ENCOUNTER — Other Ambulatory Visit: Payer: Self-pay | Admitting: Internal Medicine

## 2012-06-15 NOTE — Telephone Encounter (Signed)
Refill done.  

## 2012-07-10 ENCOUNTER — Ambulatory Visit (INDEPENDENT_AMBULATORY_CARE_PROVIDER_SITE_OTHER): Payer: Medicare PPO | Admitting: Internal Medicine

## 2012-07-10 ENCOUNTER — Encounter: Payer: Self-pay | Admitting: Internal Medicine

## 2012-07-10 VITALS — BP 134/80 | HR 72 | Temp 97.8°F | Ht 63.5 in | Wt 154.0 lb

## 2012-07-10 DIAGNOSIS — M1990 Unspecified osteoarthritis, unspecified site: Secondary | ICD-10-CM

## 2012-07-10 DIAGNOSIS — M810 Age-related osteoporosis without current pathological fracture: Secondary | ICD-10-CM

## 2012-07-10 DIAGNOSIS — Z01818 Encounter for other preprocedural examination: Secondary | ICD-10-CM | POA: Insufficient documentation

## 2012-07-10 NOTE — Progress Notes (Signed)
  Subjective:    Patient ID: Alice Howard, female    DOB: 12/15/1945, 67 y.o.   MRN: [id number]  HPI Here to discuss the following issues: DJD, to have a left TKR in July, needs clearance. High cholesterol, good medication compliance, no apparent side effects. Osteoporosis, good compliance with medicines.  Past Medical History  Diagnosis Date  . Hyperlipidemia   . Osteoporosis   . History of colonic polyps     multiple Cscopes,  . Headache   . Allergic rhinitis   . OA (osteoarthritis)     sees Ortho, 2011 they talked about TKR L  . Baker's cyst     at the L per MRI ordered by ortho aprox 2008 patient thinks is B   Past Surgical History  Procedure Laterality Date  . Hemorrhoid surgery  1984  . Tubal ligation    . Knee surgery  1994    due to skiing accident.  Has 2 screws in knee. Gets Euflexxa injections.   History   Social History  . Marital Status: Married    Spouse Name: N/A    Number of Children: 2  . Years of Education: N/A   Occupational History  . curriculum facilitator-- retired 2012  [employer]   Social History Main Topics  . Smoking status: Never Smoker   . Smokeless tobacco: Never Used  . Alcohol Use: 2.4 oz/week    4 Glasses of wine per week     Comment: wine   . Drug Use: No  . Sexually Active: Not on file   Other Topics Concern  . Not on file   Social History Narrative   Works part time   Exercise- +4/week   Diet- healthy   Family History  Problem Relation Age of Onset  . Heart attack Other 70    GM  . Breast cancer Neg Hx   . Colon cancer Mother   . Hypertension Mother   . Cancer Father     "myeloid metaplasia? similar to leukemia"     Review of Systems She feels great. Very active, goes to swimming twice a week and to "[employer]" twice a week; Denies Exertional chest pain, no DOE, Not a lower extremity edema or not. No  nausea, vomiting, diarrhea. occ snoring. Not feeling sleepy, tired.      Objective:   Physical  Exam BP 134/80  Pulse 72  Temp(Src) 97.8 F (36.6 C) (Oral)  Ht 5' 3.5" (1.613 m)  Wt 154 lb (69.854 kg)  BMI 26.85 kg/m2  SpO2 94%  General -- alert, well-developed, No apparent distress Neck --no JVD at 45 Lungs -- normal respiratory effort, no intercostal retractions, no accessory muscle use, and normal breath sounds.   Heart-- normal rate, regular rhythm, no murmur, and no gallop.   Abdomen--soft, non-tender, no distention, no masses, no HSM, no guarding, and no rigidity.   Extremities-- no pretibial edema bilaterally Neurologic-- alert & oriented X3 and strength normal in all extremities. Psych-- Cognition and judgment appear intact. Alert and cooperative with normal attention span and concentration.  not anxious appearing and not depressed appearing.      Assessment & Plan:  Today , I spent more than 25 min with the patient, >50% of the time counseling, and   reviewing the chart and labs

## 2012-07-10 NOTE — Patient Instructions (Addendum)
You are cleared for surgery but will need preop labs usually done immediately before surgery by the anesthesia departament Stay active Next visit 11-2012 for a  physical exam

## 2012-07-10 NOTE — Assessment & Plan Note (Addendum)
67 year old lady with no cardiac history, no family history of early heart disease, nonsmoker needs a left total knee replacement. Chart review:  In 2011 have a normal echocardiogram except for a grade 1 diastolic dysfunction Also had a negative stress test---> we'll get records. Addendum: Stress test at [facility] on 12-7-20111 was normal. EKG today nsr Plan: Cleared for surgery Will need routine preop labs

## 2012-07-11 ENCOUNTER — Encounter: Payer: Self-pay | Admitting: Internal Medicine

## 2012-07-11 DIAGNOSIS — M1990 Unspecified osteoarthritis, unspecified site: Secondary | ICD-10-CM | POA: Insufficient documentation

## 2012-07-11 NOTE — Assessment & Plan Note (Signed)
Cleared for knee surgery. 

## 2012-07-11 NOTE — Assessment & Plan Note (Signed)
Good med compliance, rec to discuss w/ ortho if she needs to stop meds at the time of surgery

## 2012-07-17 ENCOUNTER — Other Ambulatory Visit: Payer: Self-pay | Admitting: Internal Medicine

## 2012-07-17 NOTE — Telephone Encounter (Signed)
Refill done.  

## 2012-08-24 ENCOUNTER — Encounter: Payer: Self-pay | Admitting: Internal Medicine

## 2012-09-03 NOTE — Progress Notes (Signed)
Surgery scheduled for 09/21/12.  Need orders in EPIC.  Thank You.  

## 2012-09-08 NOTE — Progress Notes (Signed)
Need orders please - Pt coming for preop WED 09/16/12 - Thank you

## 2012-09-09 ENCOUNTER — Encounter (HOSPITAL_COMMUNITY): Payer: Self-pay | Admitting: Pharmacy Technician

## 2012-09-11 ENCOUNTER — Other Ambulatory Visit: Payer: Self-pay | Admitting: Orthopedic Surgery

## 2012-09-11 MED ORDER — DEXAMETHASONE SODIUM PHOSPHATE 10 MG/ML IJ SOLN: 10.0000 mg | Freq: Once | INTRAMUSCULAR | Status: DC

## 2012-09-11 MED ORDER — BUPIVACAINE LIPOSOME 1.3 % IJ SUSP: 20.0000 mL | Freq: Once | INTRAMUSCULAR | Status: DC

## 2012-09-11 NOTE — Progress Notes (Signed)
Preoperative surgical orders have been place into the Epic hospital system for Alice Howard on 09/11/2012, 8:24 AM  by Patrica Duel for surgery on 09/21/12.  Preop Total Knee orders including Experal, IV Tylenol, and IV Decadron as long as there are no contraindications to the above medications. Avel Peace, PA-C

## 2012-09-15 NOTE — Progress Notes (Signed)
EKG 07/10/12 on EPIC

## 2012-09-15 NOTE — Patient Instructions (Addendum)
20 KIMBERLE STANFILL  09/15/2012   Your procedure is scheduled on: 09/21/12  Report to [facility] at 6:25 AM.  Call this number if you have problems the morning of surgery [phone]: [phone]   Remember:   Do not eat food or drink liquids After Midnight.   Do not wear jewelry, make-up or nail polish.  Do not wear lotions, powders, or perfumes. You may wear deodorant.  Do not shave 48 hours prior to surgery. Men may shave face and neck.  Do not bring valuables to the hospital.  Contacts, dentures or bridgework may not be worn into surgery.  Leave suitcase in the car. After surgery it may be brought to your room.  For patients admitted to the hospital, checkout time is 11:00 AM the day of discharge.    Please read over the following fact sheets that you were given: MRSA Information, incentive spirometry fact sheet, blood fact sheet Birdie Sons, RN  pre op nurse call if needed [phone]    FAILURE TO FOLLOW THESE INSTRUCTIONS MAY RESULT IN CANCELLATION OF YOUR SURGERY   Patient Signature: ___________________________________________

## 2012-09-16 ENCOUNTER — Encounter (HOSPITAL_COMMUNITY): Payer: Self-pay

## 2012-09-16 ENCOUNTER — Encounter (HOSPITAL_COMMUNITY)
Admission: RE | Admit: 2012-09-16 | Discharge: 2012-09-16 | Disposition: A | Discharge status: Home / Self Care | Payer: Medicare PPO | Source: Ambulatory Visit | Attending: Orthopedic Surgery | Admitting: Orthopedic Surgery

## 2012-09-16 HISTORY — DX: Dizziness and giddiness: R42

## 2012-09-16 LAB — PROTIME-INR
INR: 0.92 (ref 0.00–1.49)
Prothrombin Time: 12.2 seconds (ref 11.6–15.2)

## 2012-09-16 LAB — CBC
HCT: 41.1 % (ref 36.0–46.0)
Hemoglobin: 13.6 g/dL (ref 12.0–15.0)
MCV: 92.2 fL (ref 78.0–100.0)
WBC: 5.3 10*3/uL (ref 4.0–10.5)

## 2012-09-16 LAB — COMPREHENSIVE METABOLIC PANEL
Alkaline Phosphatase: 53 U/L (ref 39–117)
BUN: 18 mg/dL (ref 6–23)
CO2: 29 mEq/L (ref 19–32)
Chloride: 102 mEq/L (ref 96–112)
Creatinine, Ser: 0.67 mg/dL (ref 0.50–1.10)
GFR calc Af Amer: >90 mL/min (ref >90)
GFR calc non Af Amer: >90 mL/min (ref >90)
Glucose, Bld: 96 mg/dL (ref 70–99)
Potassium: 4.5 mEq/L (ref 3.5–5.1)
Total Bilirubin: 1 mg/dL (ref 0.3–1.2)

## 2012-09-16 LAB — URINALYSIS, ROUTINE W REFLEX MICROSCOPIC
Bilirubin Urine: NEGATIVE
Hgb urine dipstick: NEGATIVE
Ketones, ur: NEGATIVE mg/dL
Protein, ur: NEGATIVE mg/dL
Specific Gravity, Urine: 1.028 (ref 1.005–1.030)
Urobilinogen, UA: 1 mg/dL (ref 0.0–1.0)

## 2012-09-16 LAB — APTT: aPTT: 30 seconds (ref 24–37)

## 2012-09-16 LAB — SURGICAL PCR SCREEN: Staphylococcus aureus: NEGATIVE

## 2012-09-20 ENCOUNTER — Other Ambulatory Visit: Payer: Self-pay | Admitting: Surgical

## 2012-09-20 NOTE — H&P (Signed)
TOTAL KNEE ADMISSION H&P  Patient is being admitted for left total knee arthroplasty.  Subjective:  Chief Complaint:left knee pain.  HPI: Alice Howard, 67 y.o. female, has a history of pain and functional disability in the left knee due to trauma and arthritis and has failed non-surgical conservative treatments for greater than 12 weeks to includeNSAID's and/or analgesics, corticosteriod injections, viscosupplementation injections and activity modification.  Onset of symptoms was gradual, starting >10 years ago with gradually worsening course since that time. The patient noted prior procedures on the knee to include  ORIF tibial plateau on the left knee(s).  Patient currently rates pain in the left knee(s) at 7 out of 10 with activity. Patient has night pain, worsening of pain with activity and weight bearing, pain that interferes with activities of daily living, crepitus and joint swelling.  Patient has evidence of periarticular osteophytes and joint space narrowing by imaging studies. This patient has had proximal tibial fracture.   Patient Active Problem List   Diagnosis Date Noted  . DJD (degenerative joint disease) 07/11/2012  . Preoperative clearance 07/10/2012  . Vertigo 01/01/2011  . Annual physical exam 10/22/2010  . INGUINAL PAIN 05/11/2010  . ALLERGIC RHINITIS 01/06/2008  . HEADACHE 04/07/2007  . HYPERLIPIDEMIA 04/15/2006  . OSTEOPOROSIS 04/15/2006  . COLONIC POLYPS, HX OF 02/08/2006   Past Medical History  Diagnosis Date  . Hyperlipidemia   . Osteoporosis   . History of colonic polyps     multiple Cscopes,  . Allergic rhinitis   . OA (osteoarthritis)     sees Ortho, 2011 they talked about TKR L  . Baker's cyst     at the L per MRI ordered by ortho aprox 2008 patient thinks is B  . Vertigo     Past Surgical History  Procedure Laterality Date  . Hemorrhoid surgery  1984  . Tubal ligation    . Knee surgery  1994    due to skiing accident.  Has 2 screws in knee.  Gets Euflexxa injections.  . Colonoscopy        Current outpatient prescriptions: acetaminophen (TYLENOL) 500 MG tablet, Take 500 mg by mouth every 6 (six) hours as needed for pain., Disp: , Rfl: ;  aspirin 81 MG tablet, Take 81 mg by mouth daily.  , Disp: , Rfl: ;  fish oil-omega-3 fatty acids 1000 MG capsule, Take 2 g by mouth daily. , Disp: , Rfl: ;  hydrocortisone (PROCTOCORT) 1 % CREA, Apply 1 application topically daily as needed (for bug bites)., Disp: , Rfl:  ibandronate (BONIVA) 150 MG tablet, Take 150 mg by mouth every 30 (thirty) days. Take in the morning with a full glass of water, on an empty stomach, and do not take anything else by mouth or lie down for the next 30 min., Disp: , Rfl: ;  Multiple Vitamin (MULTIVITAMIN WITH MINERALS) TABS, Take 1 tablet by mouth daily., Disp: , Rfl: ;  simvastatin (ZOCOR) 20 MG tablet, Take 20 mg by mouth every evening., Disp: , Rfl:   No Known Allergies  History  Substance Use Topics  . Smoking status: Never Smoker   . Smokeless tobacco: Never Used  . Alcohol Use: 0.0 oz/week     Comment: 2 times a week, wine    Family History  Problem Relation Age of Onset  . Heart attack Other 70    GM  . Breast cancer Neg Hx   . Colon cancer Mother   . Hypertension Mother   . Cancer  Father     "myeloid metaplasia? similar to leukemia"     Review of Systems  Constitutional: Negative.   HENT: Negative.  Negative for neck pain.   Eyes: Negative.   Respiratory: Negative.   Cardiovascular: Negative.   Gastrointestinal: Negative.   Genitourinary: Negative.   Musculoskeletal: Positive for joint pain. Negative for myalgias, back pain and falls.       Left knee pain  Skin: Negative.   Neurological: Negative.   Endo/Heme/Allergies: Negative.   Psychiatric/Behavioral: Negative.     Objective:  Physical Exam  Constitutional: She is oriented to person, place, and time. She appears well-developed and well-nourished. No distress.  HENT:  Head:  Normocephalic and atraumatic.  Right Ear: External ear normal.  Left Ear: External ear normal.  Nose: Nose normal.  Mouth/Throat: Oropharynx is clear and moist.  Eyes: Conjunctivae and EOM are normal.  Neck: Normal range of motion. Neck supple. No tracheal deviation present. No thyromegaly present.  Cardiovascular: Normal rate, regular rhythm, normal heart sounds and intact distal pulses.   No murmur heard. Respiratory: Effort normal and breath sounds normal. No respiratory distress. She has no wheezes. She exhibits no tenderness.  GI: Soft. Bowel sounds are normal. She exhibits no distension and no mass. There is no tenderness.  Musculoskeletal:       Right hip: Normal.       Left hip: Normal.       Right knee: Normal.       Left knee: She exhibits decreased range of motion and swelling. She exhibits no effusion and no erythema. Tenderness found. Medial joint line and lateral joint line tenderness noted.  Her left knee shows a significant valgus deformity of approximately 15 degrees. She has ROM of 5-125 degrees. There is marked crepitus on ROM. She is tender lateral greater than medial. There is no instability. There is no effusion. Right knee exam is normal. Both hip exams are normal. She walks with an antalgic gait pattern on the left.  Lymphadenopathy:    She has no cervical adenopathy.  Neurological: She is alert and oriented to person, place, and time. She has normal strength and normal reflexes. No sensory deficit.  Skin: No rash noted. She is not diaphoretic. No erythema.  Psychiatric: Her behavior is normal.    Vitals Pulse: 66 (Regular) BP: 118/74 (Sitting, Left Arm, Standard)   Estimated body mass index is 26.80 kg/(m^2) as calculated from the following:   Height as of 11/21/11: 5\' 4"  (1.626 m).   Weight as of 11/21/11: 70.852 kg (156 lb 3.2 oz).   Imaging Review Plain radiographs demonstrate severe degenerative joint disease of the left knee(s). The  overall alignment issignificant valgus. The bone quality appears to be good for age and reported activity level. AP of both knees and lateral of the left show that she has bone on bone arthritis in the lateral compartment of the left knee with 2 cannulated screws going from lateral to medial. The fracture is well healed. She also has bone on bone patellofemoral arthritis.  Assessment/Plan:  End stage arthritis, left knee   The patient history, physical examination, clinical judgment of the provider and imaging studies are consistent with end stage degenerative joint disease of the left knee(s) and total knee arthroplasty is deemed medically necessary. The treatment options including medical management, injection therapy arthroscopy and arthroplasty were discussed at length. The risks and benefits of total knee arthroplasty were presented and reviewed. The risks due to aseptic loosening, infection, stiffness, patella  tracking problems, thromboembolic complications and other imponderables were discussed. The patient acknowledged the explanation, agreed to proceed with the plan and consent was signed. Patient is being admitted for inpatient treatment for surgery, pain control, PT, OT, prophylactic antibiotics, VTE prophylaxis, progressive ambulation and ADL's and discharge planning. The patient is planning to be discharged home with home health services      [place], [place]

## 2012-09-21 ENCOUNTER — Encounter (HOSPITAL_COMMUNITY): Payer: Self-pay | Admitting: *Deleted

## 2012-09-21 ENCOUNTER — Encounter (HOSPITAL_COMMUNITY)
Admission: RE | Disposition: A | Discharge status: Home Health | Payer: Self-pay | Source: Ambulatory Visit | Attending: Orthopedic Surgery

## 2012-09-21 ENCOUNTER — Inpatient Hospital Stay (HOSPITAL_COMMUNITY): Payer: Medicare PPO | Admitting: Anesthesiology

## 2012-09-21 ENCOUNTER — Encounter (HOSPITAL_COMMUNITY): Payer: Self-pay | Admitting: Anesthesiology

## 2012-09-21 ENCOUNTER — Inpatient Hospital Stay (HOSPITAL_COMMUNITY)
Admission: RE | Admit: 2012-09-21 | Discharge: 2012-09-23 | DRG: 470 | Disposition: A | Discharge status: Home Health | Payer: Medicare PPO | Source: Ambulatory Visit | Attending: Orthopedic Surgery | Admitting: Orthopedic Surgery

## 2012-09-21 DIAGNOSIS — Z96652 Presence of left artificial knee joint: Secondary | ICD-10-CM

## 2012-09-21 DIAGNOSIS — M179 Osteoarthritis of knee, unspecified: Secondary | ICD-10-CM | POA: Diagnosis present

## 2012-09-21 DIAGNOSIS — M810 Age-related osteoporosis without current pathological fracture: Secondary | ICD-10-CM | POA: Diagnosis present

## 2012-09-21 DIAGNOSIS — Z9889 Other specified postprocedural states: Secondary | ICD-10-CM

## 2012-09-21 DIAGNOSIS — M2540 Effusion, unspecified joint: Secondary | ICD-10-CM | POA: Diagnosis present

## 2012-09-21 DIAGNOSIS — E785 Hyperlipidemia, unspecified: Secondary | ICD-10-CM | POA: Diagnosis present

## 2012-09-21 DIAGNOSIS — Z01812 Encounter for preprocedural laboratory examination: Secondary | ICD-10-CM

## 2012-09-21 DIAGNOSIS — D62 Acute posthemorrhagic anemia: Secondary | ICD-10-CM

## 2012-09-21 DIAGNOSIS — M1710 Unilateral primary osteoarthritis, unspecified knee: Secondary | ICD-10-CM

## 2012-09-21 DIAGNOSIS — E871 Hypo-osmolality and hyponatremia: Secondary | ICD-10-CM

## 2012-09-21 HISTORY — PX: TOTAL KNEE ARTHROPLASTY: SHX125

## 2012-09-21 LAB — TYPE AND SCREEN: Antibody Screen: NEGATIVE

## 2012-09-21 SURGERY — ARTHROPLASTY, KNEE, TOTAL
Anesthesia: Spinal | Site: Knee | Laterality: Left | Wound class: Clean

## 2012-09-21 MED ORDER — KETAMINE HCL 10 MG/ML IJ SOLN
INTRAMUSCULAR | Status: DC | PRN
  Administered 2012-09-21: 12 mg via INTRAVENOUS

## 2012-09-21 MED ORDER — RIVAROXABAN 10 MG PO TABS
10.0000 mg | ORAL_TABLET | Freq: Every day | ORAL | Status: DC
  Administered 2012-09-22 – 2012-09-23 (×2): 10 mg via ORAL
  Filled 2012-09-21 (×3): qty 1

## 2012-09-21 MED ORDER — BUPIVACAINE IN DEXTROSE 0.75-8.25 % IT SOLN
INTRATHECAL | Status: DC | PRN
  Administered 2012-09-21: 1.8 mL via INTRATHECAL

## 2012-09-21 MED ORDER — BUPIVACAINE HCL 0.25 % IJ SOLN
INTRAMUSCULAR | Status: DC | PRN
  Administered 2012-09-21: 20 mL

## 2012-09-21 MED ORDER — HYDROMORPHONE HCL PF 1 MG/ML IJ SOLN: 0.2500 mg | INTRAMUSCULAR | Status: DC | PRN

## 2012-09-21 MED ORDER — DEXAMETHASONE 6 MG PO TABS
10.0000 mg | ORAL_TABLET | Freq: Every day | ORAL | Status: AC
  Filled 2012-09-21: qty 1

## 2012-09-21 MED ORDER — MIDAZOLAM HCL 5 MG/5ML IJ SOLN
INTRAMUSCULAR | Status: DC | PRN
  Administered 2012-09-21: 2 mg via INTRAVENOUS

## 2012-09-21 MED ORDER — PHENYLEPHRINE HCL 10 MG/ML IJ SOLN
INTRAMUSCULAR | Status: DC | PRN
  Administered 2012-09-21: 120 ug via INTRAVENOUS
  Administered 2012-09-21: 80 ug via INTRAVENOUS
  Administered 2012-09-21: 120 ug via INTRAVENOUS
  Administered 2012-09-21: 80 ug via INTRAVENOUS

## 2012-09-21 MED ORDER — DEXAMETHASONE SODIUM PHOSPHATE 10 MG/ML IJ SOLN
INTRAMUSCULAR | Status: DC | PRN
  Administered 2012-09-21: 10 mg via INTRAVENOUS

## 2012-09-21 MED ORDER — DEXAMETHASONE SODIUM PHOSPHATE 10 MG/ML IJ SOLN
10.0000 mg | Freq: Every day | INTRAMUSCULAR | Status: AC
  Administered 2012-09-22: 10 mg via INTRAVENOUS
  Filled 2012-09-21: qty 1

## 2012-09-21 MED ORDER — ONDANSETRON HCL 4 MG/2ML IJ SOLN
INTRAMUSCULAR | Status: DC | PRN
  Administered 2012-09-21: 4 mg via INTRAVENOUS

## 2012-09-21 MED ORDER — MORPHINE SULFATE 2 MG/ML IJ SOLN
1.0000 mg | INTRAMUSCULAR | Status: DC | PRN
  Administered 2012-09-21: 2 mg via INTRAVENOUS
  Filled 2012-09-21: qty 1

## 2012-09-21 MED ORDER — CEFAZOLIN SODIUM-DEXTROSE 2-3 GM-% IV SOLR
2.0000 g | INTRAVENOUS | Status: AC
  Administered 2012-09-21: 2 g via INTRAVENOUS

## 2012-09-21 MED ORDER — BUPIVACAINE LIPOSOME 1.3 % IJ SUSP
INTRAMUSCULAR | Status: DC | PRN
  Administered 2012-09-21: 20 mL

## 2012-09-21 MED ORDER — ONDANSETRON HCL 4 MG/2ML IJ SOLN
4.0000 mg | Freq: Four times a day (QID) | INTRAMUSCULAR | Status: DC | PRN
  Administered 2012-09-22: 4 mg via INTRAVENOUS
  Filled 2012-09-21: qty 2

## 2012-09-21 MED ORDER — SODIUM CHLORIDE 0.9 % IV SOLN: INTRAVENOUS | Status: DC

## 2012-09-21 MED ORDER — BUPIVACAINE LIPOSOME 1.3 % IJ SUSP
20.0000 mL | Freq: Once | INTRAMUSCULAR | Status: DC
  Filled 2012-09-21: qty 20

## 2012-09-21 MED ORDER — METOCLOPRAMIDE HCL 5 MG/ML IJ SOLN: 5.0000 mg | Freq: Three times a day (TID) | INTRAMUSCULAR | Status: DC | PRN

## 2012-09-21 MED ORDER — OXYCODONE HCL 5 MG PO TABS
5.0000 mg | ORAL_TABLET | ORAL | Status: DC | PRN
  Administered 2012-09-21 – 2012-09-23 (×10): 10 mg via ORAL
  Administered 2012-09-23 (×2): 5 mg via ORAL
  Administered 2012-09-23: 10 mg via ORAL
  Filled 2012-09-21 (×6): qty 2
  Filled 2012-09-21: qty 1
  Filled 2012-09-21 (×2): qty 2
  Filled 2012-09-21: qty 1
  Filled 2012-09-21 (×3): qty 2

## 2012-09-21 MED ORDER — PHENYLEPHRINE HCL 10 MG/ML IJ SOLN
10.0000 mg | INTRAVENOUS | Status: DC | PRN
  Administered 2012-09-21: 30 ug/min via INTRAVENOUS

## 2012-09-21 MED ORDER — SODIUM CHLORIDE 0.9 % IJ SOLN
INTRAMUSCULAR | Status: DC | PRN
  Administered 2012-09-21: 30 mL via INTRAVENOUS

## 2012-09-21 MED ORDER — METOCLOPRAMIDE HCL 5 MG PO TABS
5.0000 mg | ORAL_TABLET | Freq: Three times a day (TID) | ORAL | Status: DC | PRN
  Filled 2012-09-21: qty 2

## 2012-09-21 MED ORDER — KETOROLAC TROMETHAMINE 15 MG/ML IJ SOLN
15.0000 mg | Freq: Four times a day (QID) | INTRAMUSCULAR | Status: AC | PRN
  Administered 2012-09-22: 15 mg via INTRAVENOUS
  Filled 2012-09-21: qty 1

## 2012-09-21 MED ORDER — SIMVASTATIN 20 MG PO TABS
20.0000 mg | ORAL_TABLET | Freq: Every evening | ORAL | Status: DC
  Administered 2012-09-21 – 2012-09-22 (×2): 20 mg via ORAL
  Filled 2012-09-21 (×3): qty 1

## 2012-09-21 MED ORDER — POLYETHYLENE GLYCOL 3350 17 G PO PACK: 17.0000 g | PACK | Freq: Every day | ORAL | Status: DC | PRN

## 2012-09-21 MED ORDER — FLEET ENEMA 7-19 GM/118ML RE ENEM
1.0000 | ENEMA | Freq: Once | RECTAL | Status: AC | PRN
  Filled 2012-09-21: qty 1

## 2012-09-21 MED ORDER — DEXTROSE-NACL 5-0.9 % IV SOLN
INTRAVENOUS | Status: DC
  Administered 2012-09-21: 15:00:00 via INTRAVENOUS

## 2012-09-21 MED ORDER — MORPHINE SULFATE 2 MG/ML IJ SOLN
INTRAMUSCULAR | Status: AC
  Administered 2012-09-21: 1 mg via INTRAVENOUS
  Filled 2012-09-21: qty 1

## 2012-09-21 MED ORDER — PROPOFOL INFUSION 10 MG/ML OPTIME
INTRAVENOUS | Status: DC | PRN
  Administered 2012-09-21: 100 ug/kg/min via INTRAVENOUS

## 2012-09-21 MED ORDER — BISACODYL 10 MG RE SUPP
10.0000 mg | Freq: Every day | RECTAL | Status: DC | PRN
  Filled 2012-09-21: qty 1

## 2012-09-21 MED ORDER — MENTHOL 3 MG MT LOZG: 1.0000 | LOZENGE | OROMUCOSAL | Status: DC | PRN

## 2012-09-21 MED ORDER — DIPHENHYDRAMINE HCL 12.5 MG/5ML PO ELIX
12.5000 mg | ORAL_SOLUTION | ORAL | Status: DC | PRN
  Filled 2012-09-21: qty 10

## 2012-09-21 MED ORDER — LACTATED RINGERS IV SOLN
INTRAVENOUS | Status: DC | PRN
  Administered 2012-09-21 (×3): via INTRAVENOUS

## 2012-09-21 MED ORDER — DEXTROSE 5 % IV SOLN
500.0000 mg | Freq: Four times a day (QID) | INTRAVENOUS | Status: DC | PRN
  Filled 2012-09-21: qty 5

## 2012-09-21 MED ORDER — LACTATED RINGERS IV SOLN: INTRAVENOUS | Status: DC

## 2012-09-21 MED ORDER — TRAMADOL HCL 50 MG PO TABS
50.0000 mg | ORAL_TABLET | Freq: Four times a day (QID) | ORAL | Status: DC | PRN
  Filled 2012-09-21: qty 2

## 2012-09-21 MED ORDER — MORPHINE SULFATE 10 MG/ML IJ SOLN: 1.0000 mg | INTRAMUSCULAR | Status: DC | PRN

## 2012-09-21 MED ORDER — ONDANSETRON HCL 4 MG PO TABS
4.0000 mg | ORAL_TABLET | Freq: Four times a day (QID) | ORAL | Status: DC | PRN
  Filled 2012-09-21: qty 1

## 2012-09-21 MED ORDER — PHENOL 1.4 % MT LIQD: 1.0000 | OROMUCOSAL | Status: DC | PRN

## 2012-09-21 MED ORDER — METHOCARBAMOL 500 MG PO TABS
500.0000 mg | ORAL_TABLET | Freq: Four times a day (QID) | ORAL | Status: DC | PRN
  Administered 2012-09-22 (×2): 500 mg via ORAL
  Filled 2012-09-21 (×2): qty 1

## 2012-09-21 MED ORDER — DOCUSATE SODIUM 100 MG PO CAPS
100.0000 mg | ORAL_CAPSULE | Freq: Two times a day (BID) | ORAL | Status: DC
  Administered 2012-09-21 – 2012-09-23 (×4): 100 mg via ORAL

## 2012-09-21 MED ORDER — ACETAMINOPHEN 500 MG PO TABS
1000.0000 mg | ORAL_TABLET | Freq: Once | ORAL | Status: AC
  Administered 2012-09-21: 1000 mg via ORAL
  Filled 2012-09-21: qty 2

## 2012-09-21 MED ORDER — TRANEXAMIC ACID 100 MG/ML IV SOLN
1000.0000 mg | INTRAVENOUS | Status: AC
  Administered 2012-09-21: 1000 mg via INTRAVENOUS
  Filled 2012-09-21: qty 10

## 2012-09-21 MED ORDER — ACETAMINOPHEN 650 MG RE SUPP: 650.0000 mg | Freq: Four times a day (QID) | RECTAL | Status: AC

## 2012-09-21 MED ORDER — PROMETHAZINE HCL 25 MG/ML IJ SOLN: 6.2500 mg | INTRAMUSCULAR | Status: DC | PRN

## 2012-09-21 MED ORDER — CEFAZOLIN SODIUM 1-5 GM-% IV SOLN
1.0000 g | Freq: Four times a day (QID) | INTRAVENOUS | Status: AC
  Administered 2012-09-21 (×2): 1 g via INTRAVENOUS
  Filled 2012-09-21 (×2): qty 50

## 2012-09-21 MED ORDER — ACETAMINOPHEN 500 MG PO TABS
1000.0000 mg | ORAL_TABLET | Freq: Four times a day (QID) | ORAL | Status: AC
  Administered 2012-09-21 – 2012-09-22 (×4): 1000 mg via ORAL
  Filled 2012-09-21 (×4): qty 2

## 2012-09-21 MED ORDER — METOCLOPRAMIDE HCL 5 MG/ML IJ SOLN
INTRAMUSCULAR | Status: DC | PRN
  Administered 2012-09-21: 10 mg via INTRAVENOUS

## 2012-09-21 SURGICAL SUPPLY — 57 items
BAG ZIPLOCK 12X15 (MISCELLANEOUS) ×2 IMPLANT
BANDAGE ELASTIC 6 VELCRO ST LF (GAUZE/BANDAGES/DRESSINGS) ×2 IMPLANT
BANDAGE ESMARK 6X9 LF (GAUZE/BANDAGES/DRESSINGS) ×1 IMPLANT
BLADE SAG 18X100X1.27 (BLADE) ×2 IMPLANT
BLADE SAW SGTL 11.0X1.19X90.0M (BLADE) ×2 IMPLANT
BNDG ESMARK 6X9 LF (GAUZE/BANDAGES/DRESSINGS) ×2
BOWL SMART MIX CTS (DISPOSABLE) ×2 IMPLANT
CAPT RP KNEE ×2 IMPLANT
CEMENT HV SMART SET (Cement) ×4 IMPLANT
CLOSURE STERI-STRIP 1/4X4 (GAUZE/BANDAGES/DRESSINGS) ×2 IMPLANT
CLOTH BEACON ORANGE TIMEOUT ST (SAFETY) ×2 IMPLANT
CUFF TOURN SGL QUICK 34 (TOURNIQUET CUFF) ×1
CUFF TRNQT CYL 34X4X40X1 (TOURNIQUET CUFF) ×1 IMPLANT
DECANTER SPIKE VIAL GLASS SM (MISCELLANEOUS) ×2 IMPLANT
DRAPE EXTREMITY T 121X128X90 (DRAPE) ×2 IMPLANT
DRAPE POUCH INSTRU U-SHP 10X18 (DRAPES) ×2 IMPLANT
DRAPE U-SHAPE 47X51 STRL (DRAPES) ×2 IMPLANT
DRSG ADAPTIC 3X8 NADH LF (GAUZE/BANDAGES/DRESSINGS) ×2 IMPLANT
DRSG PAD ABDOMINAL 8X10 ST (GAUZE/BANDAGES/DRESSINGS) ×2 IMPLANT
DURAPREP 26ML APPLICATOR (WOUND CARE) ×2 IMPLANT
ELECT REM PT RETURN 9FT ADLT (ELECTROSURGICAL) ×2
ELECTRODE REM PT RTRN 9FT ADLT (ELECTROSURGICAL) ×1 IMPLANT
EVACUATOR 1/8 PVC DRAIN (DRAIN) ×2 IMPLANT
FACESHIELD LNG OPTICON STERILE (SAFETY) ×10 IMPLANT
GLOVE BIO SURGEON STRL SZ7.5 (GLOVE) IMPLANT
GLOVE BIO SURGEON STRL SZ8 (GLOVE) ×2 IMPLANT
GLOVE BIOGEL PI IND STRL 8 (GLOVE) ×1 IMPLANT
GLOVE BIOGEL PI INDICATOR 8 (GLOVE) ×1
GLOVE ECLIPSE 6.5 STRL STRAW (GLOVE) ×4 IMPLANT
GLOVE SURG SS PI 6.5 STRL IVOR (GLOVE) ×6 IMPLANT
GOWN STRL NON-REIN LRG LVL3 (GOWN DISPOSABLE) ×4 IMPLANT
GOWN STRL REIN XL XLG (GOWN DISPOSABLE) ×4 IMPLANT
HANDPIECE INTERPULSE COAX TIP (DISPOSABLE) ×1
IMMOBILIZER KNEE 20 (SOFTGOODS) ×2
IMMOBILIZER KNEE 20 THIGH 36 (SOFTGOODS) ×1 IMPLANT
KIT BASIN OR (CUSTOM PROCEDURE TRAY) ×2 IMPLANT
MANIFOLD NEPTUNE II (INSTRUMENTS) ×2 IMPLANT
NDL SAFETY ECLIPSE 18X1.5 (NEEDLE) ×2 IMPLANT
NEEDLE HYPO 18GX1.5 SHARP (NEEDLE) ×2
NS IRRIG 1000ML POUR BTL (IV SOLUTION) ×2 IMPLANT
PACK TOTAL JOINT (CUSTOM PROCEDURE TRAY) ×2 IMPLANT
PADDING CAST COTTON 6X4 STRL (CAST SUPPLIES) ×6 IMPLANT
POSITIONER SURGICAL ARM (MISCELLANEOUS) ×2 IMPLANT
SET HNDPC FAN SPRY TIP SCT (DISPOSABLE) ×1 IMPLANT
SPONGE GAUZE 4X4 12PLY (GAUZE/BANDAGES/DRESSINGS) ×2 IMPLANT
STRIP CLOSURE SKIN 1/2X4 (GAUZE/BANDAGES/DRESSINGS) ×4 IMPLANT
SUCTION FRAZIER 12FR DISP (SUCTIONS) ×2 IMPLANT
SUT MNCRL AB 4-0 PS2 18 (SUTURE) ×2 IMPLANT
SUT VIC AB 2-0 CT1 27 (SUTURE) ×3
SUT VIC AB 2-0 CT1 TAPERPNT 27 (SUTURE) ×3 IMPLANT
SUT VLOC 180 0 24IN GS25 (SUTURE) ×2 IMPLANT
SYR 20CC LL (SYRINGE) ×2 IMPLANT
SYR 50ML LL SCALE MARK (SYRINGE) ×2 IMPLANT
TOWEL OR 17X26 10 PK STRL BLUE (TOWEL DISPOSABLE) ×4 IMPLANT
TRAY FOLEY CATH 14FRSI W/METER (CATHETERS) ×2 IMPLANT
WATER STERILE IRR 1500ML POUR (IV SOLUTION) ×2 IMPLANT
WRAP KNEE MAXI GEL POST OP (GAUZE/BANDAGES/DRESSINGS) ×2 IMPLANT

## 2012-09-21 NOTE — Evaluation (Signed)
Physical Therapy Evaluation Patient Details Name: Alice Howard MRN: [id number] DOB: [birth year] Today's Date: 09/21/2012 Time: 1700-1720 PT Time Calculation (min): 20 min  PT Assessment / Plan / Recommendation History of Present Illness  67 yo female admitted for elective LTKR.   Clinical Impression  Pt did very well with initial ambulation on day of surgery.  Expect she will progress well and d/c to home with family and HHPT    PT Assessment  Patient needs continued PT services    Follow Up Recommendations  Home health PT    Does the patient have the potential to tolerate intense rehabilitation      Barriers to Discharge        Equipment Recommendations  Rolling walker with 5" wheels;Crutches    Recommendations for Other Services     Frequency 7X/week    Precautions / Restrictions Precautions Precautions: Knee Restrictions Weight Bearing Restrictions: Yes LLE Weight Bearing: Weight bearing as tolerated   Pertinent Vitals/Pain Pt with some pain with mobility      Mobility  Bed Mobility Bed Mobility: Supine to Sit;Sitting - Scoot to Edge of Bed Supine to Sit: 4: Min assist Sitting - Scoot to [place] of Bed: 4: Min assist Details for Bed Mobility Assistance: assist to hold left leg up and pt was able to scoot in bed Transfers Transfers: Sit to Stand;Stand to Sit Sit to Stand: 4: Min assist Stand to Sit: 4: Min assist Ambulation/Gait Ambulation/Gait Assistance: 4: Min Environmental consultant (Feet): 5 Feet Assistive device: Rolling walker Ambulation/Gait Assistance Details: safety Gait Pattern: Step-to pattern General Gait Details: pt did well with initial ambulation Stairs: No Wheelchair Mobility Wheelchair Mobility: No    Exercises Total Joint Exercises Ankle Circles/Pumps: AROM;Both;5 reps;Supine Quad Sets: AROM;Both;10 reps;Supine Gluteal Sets: AROM;Both;10 reps;Supine Short Arc Quad: AAROM;Left;5 reps;Supine   PT Diagnosis: Difficulty  walking;Acute pain  PT Problem List: Decreased range of motion;Decreased activity tolerance;Pain;Decreased mobility PT Treatment Interventions: DME instruction;Gait training;Stair training;Functional mobility training;Therapeutic activities;Therapeutic exercise     PT Goals(Current goals can be found in the care plan section) Acute Rehab PT Goals Patient Stated Goal: to get rid of knee pain PT Goal Formulation: With patient Time For Goal Achievement: 10/05/12 Potential to Achieve Goals: Good  Visit Information  Last PT Received On: 09/21/12 History of Present Illness: 67 yo female admitted for elective LTKR.        Prior Functioning  Home Living Family/patient expects to be discharged to:: Private residence Living Arrangements: Spouse/significant other Available Help at Discharge: Family Type of Home: House Home Access: Stairs to enter Home Layout: Two level Prior Function Level of Independence: Independent Communication Communication: No difficulties    Cognition  Cognition Arousal/Alertness: Awake/alert Behavior During Therapy: WFL for tasks assessed/performed Overall Cognitive Status: Within Functional Limits for tasks assessed    Extremity/Trunk Assessment Lower Extremity Assessment Lower Extremity Assessment: LLE deficits/detail LLE Deficits / Details: post op knee dressing with hemovac in place. Pt is able to activate quads and flex knee to about 30 degrees Cervical / Trunk Assessment Cervical / Trunk Assessment: Normal   Balance Balance Balance Assessed: Yes Static Sitting Balance Static Sitting - Balance Support: No upper extremity supported Static Sitting - Level of Assistance: 7: Independent Static Standing Balance Static Standing - Balance Support: Bilateral upper extremity supported Static Standing - Level of Assistance: 6: Modified independent (Device/Increase time)  End of Session PT - End of Session Equipment Utilized During Treatment: Left knee  immobilizer Activity Tolerance: Patient tolerated treatment well  Patient left: in chair;with family/visitor present Nurse Communication: Mobility status CPM Left Knee CPM Left Knee: Off  GP     Donnetta Hail 09/21/2012, 5:39 PM

## 2012-09-21 NOTE — Anesthesia Preprocedure Evaluation (Addendum)
Anesthesia Evaluation  Patient identified by MRN, date of birth, ID band Patient awake    Reviewed: Allergy & Precautions, H&P , NPO status , Patient's Chart, lab work & pertinent test results  Airway Mallampati: II  Neck ROM: Full    Dental  (+) Teeth Intact and Dental Advisory Given   Pulmonary neg pulmonary ROS,  breath sounds clear to auscultation  Pulmonary exam normal       Cardiovascular negative cardio ROS  Rhythm:Regular Rate:Normal     Neuro/Psych  Headaches, negative neurological ROS  negative psych ROS   GI/Hepatic negative GI ROS, Neg liver ROS,   Endo/Other  negative endocrine ROS  Renal/GU negative Renal ROS  negative genitourinary   Musculoskeletal  (+) Arthritis -, Osteoarthritis,    Abdominal   Peds  Hematology negative hematology ROS (+)   Anesthesia Other Findings   Reproductive/Obstetrics                          Anesthesia Physical Anesthesia Plan  ASA: I  Anesthesia Plan: Spinal   Post-op Pain Management:    Induction:   Airway Management Planned: Simple Face Mask  Additional Equipment:   Intra-op Plan:   Post-operative Plan:   Informed Consent: I have reviewed the patients History and Physical, chart, labs and discussed the procedure including the risks, benefits and alternatives for the proposed anesthesia with the patient or authorized representative who has indicated his/her understanding and acceptance.   Dental advisory given  Plan Discussed with: CRNA  Anesthesia Plan Comments:         Anesthesia Quick Evaluation

## 2012-09-21 NOTE — Interval H&P Note (Signed)
History and Physical Interval Note:  09/21/2012 7:01 AM  Alice Howard  has presented today for surgery, with the diagnosis of OA LEFT KNEE   The various methods of treatment have been discussed with the patient and family. After consideration of risks, benefits and other options for treatment, the patient has consented to  Procedure(s): LEFT TOTAL KNEE ARTHROPLASTY (Left) as a surgical intervention .  The patient's history has been reviewed, patient examined, no change in status, stable for surgery.  I have reviewed the patient's chart and labs.  Questions were answered to the patient's satisfaction.     Loanne Drilling

## 2012-09-21 NOTE — Anesthesia Procedure Notes (Signed)
Spinal  Patient location during procedure: OR Start time: 09/21/2012 9:27 AM End time: 09/21/2012 9:32 AM Staffing Anesthesiologist: Lucille Passy F Performed by: anesthesiologist  Preanesthetic Checklist Completed: patient identified, site marked, surgical consent, pre-op evaluation, timeout performed, IV checked, risks and benefits discussed and monitors and equipment checked Spinal Block Patient position: sitting Prep: Betadine Patient monitoring: heart rate, continuous pulse ox and blood pressure Injection technique: single-shot Needle Needle type: Spinocan  Needle gauge: 22 G Needle length: 9 cm Additional Notes Expiration date of kit checked and confirmed. Patient tolerated procedure well, without complications. DOE 06/2013 Lot [id number]

## 2012-09-21 NOTE — Op Note (Signed)
Pre-operative diagnosis- Osteoarthritis Left knee(s)  Post-operative diagnosis- Osteoarthritis  Left knee(s)  Procedure-   Left Total Knee Arthroplasty with hardware removal tibia  Surgeon- Gus Rankin. Sung Parodi, MD  Assistant- Dimitri Ped, PA-C   Anesthesia-  Spinal   EBL- * No blood loss amount entered *   Drains Hemovac   Tourniquet time  Total Tourniquet Time Documented: Thigh (Left) - 42 minutes Total: Thigh (Left) - 42 minutes    Complications- None  Condition-PACU - hemodynamically stable.   Brief Clinical Note  Alice Howard is a 67 y.o. year old female with end stage OA of her left knee with progressively worsening pain and dysfunction. She has constant pain, with activity and at rest and significant functional deficits with difficulties even with ADLs. She has had extensive non-op management including analgesics, injections of cortisone and viscosupplements, and home exercise program, but remains in significant pain with significant dysfunction. Radiographs show bone on bone arthritis lateral and patellofemoral with 2 retained screws from previous tibial plateau ORIF. She presents now for left Total Knee Arthroplasty.   Procedure in detail---       The patient is brought into the operating room and positioned supine on the operating table. After successful administration of Spinal anesthetic, a tourniquet is placed high on the Left thigh(s) and the lower extremity is prepped and draped in the usual sterile fashion. Time out is performed by the operating team and then the Left  lower extremity is wrapped in Esmarch, knee flexed and the tourniquet inflated to 300 mmHg.       A midline incision is made with a ten blade through the subcutaneous tissue to the level of the extensor mechanism. A fresh blade is used to make a lateral parapatellar arthrotomy due to the patients' valgus deformity. Soft tissue over the proximal lateral tibia is subperiosteally elevated to the joint line  with a knife to the posterolateral corner but not including the structures of the posterolateral corner. Soft tissue over the proximal medial tibia is elevated with attention being paid to avoiding the patellar tendon on the tibial tubercle. The patella is everted medially, knee flexed 90 degrees and the ACL and PCL are removed. Findings are bone on bone lateral and patellofemoral with large lateral osteophytes. .       The drill is used to create a starting hole in the distal femur and the canal is thoroughly irrigated with sterile saline to remove the fatty contents. The 5 degree Left  valgus alignment guide is placed into the femoral canal and the distal femoral cutting block is pinned to remove 10  mm off the distal femur. Resection is made with an oscillating saw.      The tibia is subluxed forward and the menisci are removed. The heads of the 2 screws are identified laterally and the screws and washers removed. The extramedullary alignment guide is placed referencing proximally at the medial aspect of the tibial tubercle and distally along the second metatarsal axis and tibial crest. The block is pinned to remove 2mm off the more deficient lateral side. Resection is made with an oscillating saw. Size 3  is the most appropriate size for the tibia and the proximal tibia is prepared with the modular drill and keel punch for that size.      The femoral sizing guide is placed and size 4 narrow is most appropriate. Rotation is marked off the epicondylar axis and confirmed by creating a rectangular flexion gap at 90 degrees.  The size 4  cutting block is pinned in this rotation and the anterior, posterior and chamfer cuts are made with the oscillating saw. The intercondylar block is then placed and that cut is made.      Trial size 3  tibial component, trial size 4  Narrow posterior stabilized femur and a 10  mm posterior stabilized rotating platform insert trial is placed. Full extension is achieved with  excellent varus/valgus and   anterior/posterior balance throughout full range of motion. The patella is everted and thickness measured to be 22  mm. Free hand resection is taken to 12 mm, a 38 template is placed, lug holes are drilled, trial patella is placed, and it tracks normally. Osteophytes are removed off the posterior femur with the trial in place. All trials are removed and the cut bone surfaces prepared with pulsatile lavage. Cement is mixed and once ready for implantation, the size 3  tibial implant, size 4 narrow posterior stabilized femoral component, and the size 38  patella are cemented in place and the patella is held with the clamp. The trial insert is placed and the knee held in full extension. The Exparel (20 ml mixed with 30 ml saline) and then 20 ml of .25% Bupivicaine is injected into the extensor mechanism, posterior capsule, medial and lateral gutters and subcutaneous tissues. All extruded cement is removed and once the cement is hard the permanent 10  mm posterior stabilized rotating platform insert is placed into the tibial tray.      The wound is copiously irrigated with saline solution and the tourniquet is released for a total   tourniquet time of 42  minutes. Bleeding is identified and controlled with electrocautery. The extensor mechanism is closed with interrupted #1 PDS leaving open a small area from the superior to inferior pole of the patella to serve as a mini lateral release. Flexion against gravity is 140  degrees and the patella tracks normally. Subcutaneous tissue is closed with 2.0 vicryl and subcuticular with running 4.0 Monocryl.The incision is cleaned and dried and steri-strips and a bulky sterile dressing are applied. The limb is placed into a knee immobilizer and the patient is awakened and transported to recovery in stable condition.      Please note that a surgical assistant was a medical necessity for this procedure in order to perform it in a safe and expeditious  manner. Surgical assistant was necessary to retract the ligaments and vital neurovascular structures to prevent injury to them and also necessary for proper positioning of the limb to allow for anatomic placement of the prosthesis.    Gus Rankin Shayne Deerman, MD    09/21/2012, 10:56 AM

## 2012-09-21 NOTE — Preoperative (Signed)
Beta Blockers   Reason not to administer Beta Blockers:Not Applicable, not on home BB 

## 2012-09-21 NOTE — Progress Notes (Signed)
Utilization review completed.  

## 2012-09-21 NOTE — Anesthesia Postprocedure Evaluation (Signed)
Anesthesia Post Note  Patient: Alice Howard  Procedure(s) Performed: Procedure(s) (LRB): LEFT TOTAL KNEE ARTHROPLASTY WITH HARDWARE REMOVAL (Left)  Anesthesia type: Spinal  Patient location: PACU  Post pain: Pain level controlled  Post assessment: Post-op Vital signs reviewed  Last Vitals:  Filed Vitals:   09/21/12 1226  BP: 112/76  Pulse: 60  Temp: 36.5 C  Resp: 16    Post vital signs: Reviewed  Level of consciousness: sedated  Complications: No apparent anesthesia complications

## 2012-09-21 NOTE — Transfer of Care (Signed)
Immediate Anesthesia Transfer of Care Note  Patient: Alice Howard  Procedure(s) Performed: Procedure(s): LEFT TOTAL KNEE ARTHROPLASTY WITH HARDWARE REMOVAL (Left)  Patient Location: PACU  Anesthesia Type:Spinal  Level of Consciousness: awake, alert  and oriented  Airway & Oxygen Therapy: Patient Spontanous Breathing and Patient connected to face mask oxygen  Post-op Assessment: Report given to PACU RN and Post -op Vital signs reviewed and stable  Post vital signs: Reviewed and stable  Complications: No apparent anesthesia complications

## 2012-09-22 ENCOUNTER — Encounter (HOSPITAL_COMMUNITY): Payer: Self-pay | Admitting: Orthopedic Surgery

## 2012-09-22 DIAGNOSIS — E871 Hypo-osmolality and hyponatremia: Secondary | ICD-10-CM

## 2012-09-22 DIAGNOSIS — D62 Acute posthemorrhagic anemia: Secondary | ICD-10-CM

## 2012-09-22 LAB — BASIC METABOLIC PANEL
BUN: 10 mg/dL (ref 6–23)
Chloride: 102 mEq/L (ref 96–112)
GFR calc Af Amer: >90 mL/min (ref >90)
GFR calc non Af Amer: >90 mL/min (ref >90)
Potassium: 3.7 mEq/L (ref 3.5–5.1)
Sodium: 134 mEq/L — ABNORMAL LOW (ref 135–145)

## 2012-09-22 LAB — CBC
HCT: 31.4 % — ABNORMAL LOW (ref 36.0–46.0)
MCHC: 33.8 g/dL (ref 30.0–36.0)
Platelets: 148 10*3/uL — ABNORMAL LOW (ref 150–400)
RDW: 11.6 % (ref 11.5–15.5)
WBC: 11.6 10*3/uL — ABNORMAL HIGH (ref 4.0–10.5)

## 2012-09-22 NOTE — Progress Notes (Signed)
Physical Therapy Treatment Patient Details Name: Alice Howard MRN: [id number] DOB: 08/09/1945 Today's Date: 09/22/2012 Time: 4098-1191 PT Time Calculation (min): 40 min  PT Assessment / Plan / Recommendation  PT Comments   Pt is progressing well.  Some nausea wth vomiting after activity.  Will try to begin stair training next session  Follow Up Recommendations  Home health PT     Does the patient have the potential to tolerate intense rehabilitation     Barriers to Discharge        Equipment Recommendations  Rolling walker with 5" wheels    Recommendations for Other Services    Frequency 7X/week   Progress towards PT Goals Progress towards PT goals: Progressing toward goals  Plan Current plan remains appropriate    Precautions / Restrictions Precautions Precautions: Knee Restrictions Weight Bearing Restrictions: Yes LLE Weight Bearing: Weight bearing as tolerated   Pertinent Vitals/Pain Some minor pain with exericse in left knee    Mobility  Bed Mobility Bed Mobility: Supine to Sit;Sitting - Scoot to Edge of Bed Supine to Sit: 4: Min assist Sitting - Scoot to [place] of Bed: 4: Min assist Details for Bed Mobility Assistance: assist to hold left leg up and pt was able to scoot in bed Transfers Transfers: Sit to Stand;Stand to Sit Sit to Stand: 5: Supervision Stand to Sit: 5: Supervision Details for Transfer Assistance: pt to bathroom , small urination .  Pt with nausea and vomiting after getting up and washing hands Ambulation/Gait Ambulation/Gait Assistance: 4: Min guard Ambulation Distance (Feet): 75 Feet Assistive device: Rolling walker Gait Pattern: Step-to pattern;Step-through pattern General Gait Details: improving in technique Stairs: No Wheelchair Mobility Wheelchair Mobility: No    Exercises Total Joint Exercises Ankle Circles/Pumps: AROM;Both;Supine;10 reps Quad Sets: AROM;Both;10 reps;Supine Gluteal Sets: AROM;Both;10 reps;Supine Short Arc Quad:  Left;Supine;10 reps;AROM Heel Slides: AAROM;Left;10 reps;Supine Straight Leg Raises: AAROM;Left;10 reps;Supine Long Arc Quad: AAROM;Left;10 reps;Seated Knee Flexion: AAROM;Left;10 reps;Seated Goniometric ROM: 85 flexion appears to lack a few degrees of terminal extension, difficult to measure due to bulky dressing Other Exercises Other Exercises: abd sets Other Exercises: bilateral UE hip to hip   PT Diagnosis:    PT Problem List:   PT Treatment Interventions:     PT Goals (current goals can now be found in the care plan section) Acute Rehab PT Goals Patient Stated Goal: to get rid of knee pain PT Goal Formulation: With patient Time For Goal Achievement: 10/05/12 Potential to Achieve Goals: Good  Visit Information  Last PT Received On: 09/22/12 Assistance Needed: +1 History of Present Illness: 67 yo female admitted for elective LTKR.     Subjective Data  Patient Stated Goal: to get rid of knee pain   Cognition  Cognition Arousal/Alertness: Awake/alert Behavior During Therapy: WFL for tasks assessed/performed Overall Cognitive Status: Within Functional Limits for tasks assessed    Balance  Balance Balance Assessed: Yes Static Sitting Balance Static Sitting - Balance Support: No upper extremity supported Static Sitting - Level of Assistance: 7: Independent Static Standing Balance Static Standing - Balance Support: Bilateral upper extremity supported Static Standing - Level of Assistance: 6: Modified independent (Device/Increase time)  End of Session PT - End of Session Activity Tolerance: Other (comment) (pt with nausea/vomiting at end of treament) Patient left: in chair;with family/visitor present   GP    Rosey Bath K. [place],  [phone] 09/22/2012, 11:09 AM

## 2012-09-22 NOTE — Progress Notes (Signed)
   Subjective: 1 Day Post-Op Procedure(s) (LRB): LEFT TOTAL KNEE ARTHROPLASTY WITH HARDWARE REMOVAL (Left) Patient reports pain as moderate.   Patient seen in rounds with Dr. Lequita Halt. Patient has a rough night. Patient is well, but has had some minor complaints of pain in the knee, requiring pain medications We will start therapy today.  Plan is to go Home after hospital stay.  Objective: Vital signs in last 24 hours: Temp:  [97.5 F (36.4 C)-98.3 F (36.8 C)] 97.8 F (36.6 C) (07/15 0509) Pulse Rate:  [56-74] 59 (07/15 0509) Resp:  [14-20] 18 (07/15 0509) BP: (100-167)/(58-84) 114/73 mmHg (07/15 0509) SpO2:  [96 %-100 %] 99 % (07/15 0509) Weight:  [70.3 kg (154 lb 15.7 oz)] 70.3 kg (154 lb 15.7 oz) (07/14 1300)  Intake/Output from previous day:  Intake/Output Summary (Last 24 hours) at 09/22/12 0821 Last data filed at 09/22/12 0600  Gross per 24 hour  Intake 4068.75 ml  Output   3670 ml  Net 398.75 ml    Intake/Output this shift:    Labs:  Recent Labs  09/22/12 0455  HGB 10.6*    Recent Labs  09/22/12 0455  WBC 11.6*  RBC 3.44*  HCT 31.4*  PLT 148*    Recent Labs  09/22/12 0455  NA 134*  K 3.7  CL 102  CO2 24  BUN 10  CREATININE 0.60  GLUCOSE 141*  CALCIUM 8.3*   No results found for this basename: LABPT, INR,  in the last 72 hours  EXAM General - Patient is Alert, Appropriate and Oriented Extremity - Neurovascular intact Sensation intact distally Dorsiflexion/Plantar flexion intact Dressing - dressing C/D/I Motor Function - intact, moving foot and toes well on exam.  Hemovac pulled without difficulty.  Past Medical History  Diagnosis Date  . Hyperlipidemia   . Osteoporosis   . History of colonic polyps     multiple Cscopes,  . Allergic rhinitis   . OA (osteoarthritis)     sees Ortho, 2011 they talked about TKR L  . Baker's cyst     at the L per MRI ordered by ortho aprox 2008 patient thinks is B  . Vertigo      Assessment/Plan: 1 Day Post-Op Procedure(s) (LRB): LEFT TOTAL KNEE ARTHROPLASTY WITH HARDWARE REMOVAL (Left) Principal Problem:   OA (osteoarthritis) of knee Active Problems:   Postoperative anemia due to acute blood loss   Postop Hyponatremia  Estimated body mass index is 27.46 kg/(m^2) as calculated from the following:   Height as of this encounter: 5' 2.99" (1.6 m).   Weight as of this encounter: 70.3 kg (154 lb 15.7 oz). Advance diet Up with therapy Plan for discharge tomorrow Discharge home with home health  DVT Prophylaxis - Xarelto Weight-Bearing as tolerated to left leg No vaccines. D/C O2 and Pulse OX and try on Room Air  Ercell Perlman 09/22/2012, 8:21 AM

## 2012-09-22 NOTE — Progress Notes (Signed)
Physical Therapy Treatment Patient Details Name: Alice Howard MRN: [id number] DOB: March 13, 1945 Today's Date: 09/22/2012 Time: 4098-1191 PT Time Calculation (min): 25 min  PT Assessment / Plan / Recommendation  PT Comments   Pt continues to improve each session.  Will be ready to d/c to home with HHPT in am  Follow Up Recommendations  Home health PT     Does the patient have the potential to tolerate intense rehabilitation     Barriers to Discharge        Equipment Recommendations  Rolling walker with 5" wheels (crutches delivered and adjusted)    Recommendations for Other Services    Frequency 7X/week   Progress towards PT Goals Progress towards PT goals: Progressing toward goals  Plan Current plan remains appropriate    Precautions / Restrictions     Pertinent Vitals/Pain C/o some pain in knee    Mobility  Bed Mobility Bed Mobility: Supine to Sit;Sitting - Scoot to Edge of Bed Supine to Sit: 4: Min assist;5: Supervision Sitting - Scoot to [place] of Bed: 5: Supervision Details for Bed Mobility Assistance: assist to hold left leg up and pt was able to scoot in bed Transfers Transfers: Sit to Stand;Stand to Sit Sit to Stand: 5: Supervision Stand to Sit: 5: Supervision Details for Transfer Assistance: pt improving with transfers Ambulation/Gait Ambulation/Gait Assistance: 5: Supervision Ambulation Distance (Feet): 150 Feet Assistive device: Rolling walker Ambulation/Gait Assistance Details: occasional verbal cues to bring hips under body Gait Pattern: Step-through pattern General Gait Details: improving in technique Stairs: Yes Stairs Assistance: 4: Min guard Stairs Assistance Details (indicate cue type and reason): cues for sequence and to keep trunk extended to better stability  Husband present and acknowledges he will be able to assist pt Stair Management Technique: One rail Left;With crutches Number of Stairs: 10 Wheelchair Mobility Wheelchair Mobility: No     Exercises     PT Diagnosis:    PT Problem List:   PT Treatment Interventions:     PT Goals (current goals can now be found in the care plan section) Acute Rehab PT Goals Patient Stated Goal: to get rid of knee pain PT Goal Formulation: With patient Time For Goal Achievement: 10/05/12 Potential to Achieve Goals: Good  Visit Information  Last PT Received On: 09/22/12 Assistance Needed: +1 History of Present Illness: 67 yo female admitted for elective LTKR.     Subjective Data  Patient Stated Goal: to get rid of knee pain   Cognition  Cognition Arousal/Alertness: Awake/alert Behavior During Therapy: WFL for tasks assessed/performed Overall Cognitive Status: Within Functional Limits for tasks assessed    Balance  Balance Balance Assessed: Yes Static Sitting Balance Static Sitting - Balance Support: No upper extremity supported Static Sitting - Level of Assistance: 7: Independent Static Standing Balance Static Standing - Balance Support: Bilateral upper extremity supported Static Standing - Level of Assistance: 6: Modified independent (Device/Increase time)  End of Session PT - End of Session Activity Tolerance: Patient tolerated treatment well Patient left:  (in bathroom) Nurse Communication: Mobility status   GP     Donnetta Hail 09/22/2012, 5:00 PM

## 2012-09-22 NOTE — Care Management Note (Signed)
    Page 1 of 1   09/22/2012     4:56:27 PM   CARE MANAGEMENT NOTE 09/22/2012  Patient:  Alice Howard, Alice Howard   Account Number:  [id number]  Date Initiated:  09/22/2012  Documentation initiated by:  Colleen Can  Subjective/Objective Assessment:   Dx OA left knee; total knee replacemnt     Action/Plan:   CM spoke with patient. Plans are for patient to go to SNF for rehab.   Anticipated DC Date:  09/24/2012   Anticipated DC Plan:  SKILLED NURSING FACILITY  In-house referral  Clinical Social Worker      DC Planning Services  CM consult      Choice offered to / List presented to:             Status of service:  Completed, signed off Medicare Important Message given?   (If response is "NO", the following Medicare IM given date fields will be blank) Date Medicare IM given:   Date Additional Medicare IM given:    Discharge Disposition:    Per UR Regulation:    If discussed at Long Length of Stay Meetings, dates discussed:    Comments:

## 2012-09-23 LAB — CBC
HCT: 29.3 % — ABNORMAL LOW (ref 36.0–46.0)
Hemoglobin: 9.9 g/dL — ABNORMAL LOW (ref 12.0–15.0)
MCHC: 33.8 g/dL (ref 30.0–36.0)
RBC: 3.19 MIL/uL — ABNORMAL LOW (ref 3.87–5.11)
WBC: 9.9 10*3/uL (ref 4.0–10.5)

## 2012-09-23 LAB — BASIC METABOLIC PANEL
BUN: 11 mg/dL (ref 6–23)
CO2: 29 mEq/L (ref 19–32)
Chloride: 103 mEq/L (ref 96–112)
GFR calc non Af Amer: >90 mL/min (ref >90)
Glucose, Bld: 120 mg/dL — ABNORMAL HIGH (ref 70–99)
Potassium: 3.8 mEq/L (ref 3.5–5.1)
Sodium: 138 mEq/L (ref 135–145)

## 2012-09-23 MED ORDER — CYCLOBENZAPRINE HCL 5 MG PO TABS: 5.0000 mg | ORAL_TABLET | Freq: Three times a day (TID) | ORAL | Status: DC | PRN

## 2012-09-23 MED ORDER — TRAMADOL HCL 50 MG PO TABS: 50.0000 mg | ORAL_TABLET | Freq: Four times a day (QID) | ORAL | Status: DC | PRN

## 2012-09-23 MED ORDER — CYCLOBENZAPRINE HCL 10 MG PO TABS: 5.0000 mg | ORAL_TABLET | Freq: Three times a day (TID) | ORAL | Status: DC | PRN

## 2012-09-23 MED ORDER — OXYCODONE HCL 5 MG PO TABS: 5.0000 mg | ORAL_TABLET | ORAL | Status: DC | PRN

## 2012-09-23 MED ORDER — RIVAROXABAN 10 MG PO TABS: 10.0000 mg | ORAL_TABLET | Freq: Every day | ORAL | Status: DC

## 2012-09-23 NOTE — Discharge Summary (Signed)
Physician Discharge Summary   Patient ID: Alice Howard MRN: [id number] DOB/AGE: Sep 04, 1945 67 y.o.  Admit date: 09/21/2012 Discharge date: 09/23/2012  Primary Diagnosis:  Osteoarthritis Left knee  Admission Diagnoses:  Past Medical History  Diagnosis Date  . Hyperlipidemia   . Osteoporosis   . History of colonic polyps     multiple Cscopes,  . Allergic rhinitis   . OA (osteoarthritis)     sees Ortho, 2011 they talked about TKR L  . Baker's cyst     at the L per MRI ordered by ortho aprox 2008 patient thinks is B  . Vertigo    Discharge Diagnoses:   Principal Problem:   OA (osteoarthritis) of knee Active Problems:   Postoperative anemia due to acute blood loss   Postop Hyponatremia  Estimated body mass index is 27.46 kg/(m^2) as calculated from the following:   Height as of this encounter: 5' 2.99" (1.6 m).   Weight as of this encounter: 70.3 kg (154 lb 15.7 oz).  Procedure:  Procedure(s) (LRB): LEFT TOTAL KNEE ARTHROPLASTY WITH HARDWARE REMOVAL (Left)   Consults: None  HPI: Alice Howard is a 67 y.o. year old female with end stage OA of her left knee with progressively worsening pain and dysfunction. She has constant pain, with activity and at rest and significant functional deficits with difficulties even with ADLs. She has had extensive non-op management including analgesics, injections of cortisone and viscosupplements, and home exercise program, but remains in significant pain with significant dysfunction. Radiographs show bone on bone arthritis lateral and patellofemoral with 2 retained screws from previous tibial plateau ORIF. She presents now for left Total Knee Arthroplasty.   Laboratory Data: Admission on 09/21/2012, Discharged on 09/23/2012  Component Date Value Range Status  . WBC 09/22/2012 11.6* 4.0 - 10.5 K/uL Final  . RBC 09/22/2012 3.44* 3.87 - 5.11 MIL/uL Final  . Hemoglobin 09/22/2012 10.6* 12.0 - 15.0 g/dL Final  . HCT 59/56/3875 31.4* 36.0 - 46.0  % Final  . MCV 09/22/2012 91.3  78.0 - 100.0 fL Final  . MCH 09/22/2012 30.8  26.0 - 34.0 pg Final  . MCHC 09/22/2012 33.8  30.0 - 36.0 g/dL Final  . RDW 64/33/2951 11.6  11.5 - 15.5 % Final  . Platelets 09/22/2012 148* 150 - 400 K/uL Final  . Sodium 09/22/2012 134* 135 - 145 mEq/L Final  . Potassium 09/22/2012 3.7  3.5 - 5.1 mEq/L Final  . Chloride 09/22/2012 102  96 - 112 mEq/L Final  . CO2 09/22/2012 24  19 - 32 mEq/L Final  . Glucose, Bld 09/22/2012 141* 70 - 99 mg/dL Final  . BUN 88/41/6606 10  6 - 23 mg/dL Final  . Creatinine, Ser 09/22/2012 0.60  0.50 - 1.10 mg/dL Final  . Calcium 30/16/0109 8.3* 8.4 - 10.5 mg/dL Final  . GFR calc non Af Amer 09/22/2012 >90  >90 mL/min Final  . GFR calc Af Amer 09/22/2012 >90  >90 mL/min Final   Comment:                                 The eGFR has been calculated                          using the CKD EPI equation.  This calculation has not been                          validated in all clinical                          situations.                          eGFR's persistently                          <90 mL/min signify                          possible Chronic Kidney Disease.  . WBC 09/23/2012 9.9  4.0 - 10.5 K/uL Final  . RBC 09/23/2012 3.19* 3.87 - 5.11 MIL/uL Final  . Hemoglobin 09/23/2012 9.9* 12.0 - 15.0 g/dL Final  . HCT 09/81/1914 29.3* 36.0 - 46.0 % Final  . MCV 09/23/2012 91.8  78.0 - 100.0 fL Final  . MCH 09/23/2012 31.0  26.0 - 34.0 pg Final  . MCHC 09/23/2012 33.8  30.0 - 36.0 g/dL Final  . RDW 78/29/5621 11.8  11.5 - 15.5 % Final  . Platelets 09/23/2012 141* 150 - 400 K/uL Final  . Sodium 09/23/2012 138  135 - 145 mEq/L Final  . Potassium 09/23/2012 3.8  3.5 - 5.1 mEq/L Final  . Chloride 09/23/2012 103  96 - 112 mEq/L Final  . CO2 09/23/2012 29  19 - 32 mEq/L Final  . Glucose, Bld 09/23/2012 120* 70 - 99 mg/dL Final  . BUN 30/86/5784 11  6 - 23 mg/dL Final  . Creatinine, Ser 09/23/2012 0.60  0.50 -  1.10 mg/dL Final  . Calcium 69/62/9528 8.7  8.4 - 10.5 mg/dL Final  . GFR calc non Af Amer 09/23/2012 >90  >90 mL/min Final  . GFR calc Af Amer 09/23/2012 >90  >90 mL/min Final   Comment:                                 The eGFR has been calculated                          using the CKD EPI equation.                          This calculation has not been                          validated in all clinical                          situations.                          eGFR's persistently                          <90 mL/min signify                          possible Chronic Kidney Disease.  Hospital Outpatient Visit on 09/16/2012  Component Date Value Range Status  . MRSA, PCR 09/16/2012 NEGATIVE  NEGATIVE Final  . Staphylococcus aureus 09/16/2012 NEGATIVE  NEGATIVE Final   Comment:                                 The Xpert SA Assay (FDA                          approved for NASAL specimens                          in patients over 65 years of age),                          is one component of                          a comprehensive surveillance                          program.  Test performance has                          been validated by [employer] for patients greater                          than or equal to 58 year old.                          It is not intended                          to diagnose infection nor to                          guide or monitor treatment.  Marland Kitchen aPTT 09/16/2012 30  24 - 37 seconds Final  . WBC 09/16/2012 5.3  4.0 - 10.5 K/uL Final  . RBC 09/16/2012 4.46  3.87 - 5.11 MIL/uL Final  . Hemoglobin 09/16/2012 13.6  12.0 - 15.0 g/dL Final  . HCT 40/98/1191 41.1  36.0 - 46.0 % Final  . MCV 09/16/2012 92.2  78.0 - 100.0 fL Final  . MCH 09/16/2012 30.5  26.0 - 34.0 pg Final  . MCHC 09/16/2012 33.1  30.0 - 36.0 g/dL Final  . RDW 47/82/9562 11.8  11.5 - 15.5 % Final  . Platelets 09/16/2012 179  150 - 400 K/uL Final  . Sodium  09/16/2012 138  135 - 145 mEq/L Final  . Potassium 09/16/2012 4.5  3.5 - 5.1 mEq/L Final  . Chloride 09/16/2012 102  96 - 112 mEq/L Final  . CO2 09/16/2012 29  19 - 32 mEq/L Final  . Glucose, Bld 09/16/2012 96  70 - 99 mg/dL Final  . BUN 13/10/6576 18  6 - 23 mg/dL Final  . Creatinine, Ser 09/16/2012 0.67  0.50 - 1.10 mg/dL Final  . Calcium 46/96/2952 9.7  8.4 - 10.5 mg/dL Final  . Total Protein 09/16/2012 7.2  6.0 - 8.3  g/dL Final  . Albumin 16/12/9602 3.8  3.5 - 5.2 g/dL Final  . AST 54/11/8117 23  0 - 37 U/L Final  . ALT 09/16/2012 21  0 - 35 U/L Final  . Alkaline Phosphatase 09/16/2012 53  39 - 117 U/L Final  . Total Bilirubin 09/16/2012 1.0  0.3 - 1.2 mg/dL Final  . GFR calc non Af Amer 09/16/2012 >90  >90 mL/min Final  . GFR calc Af Amer 09/16/2012 >90  >90 mL/min Final   Comment:                                 The eGFR has been calculated                          using the CKD EPI equation.                          This calculation has not been                          validated in all clinical                          situations.                          eGFR's persistently                          <90 mL/min signify                          possible Chronic Kidney Disease.  Marland Kitchen Prothrombin Time 09/16/2012 12.2  11.6 - 15.2 seconds Final  . INR 09/16/2012 0.92  0.00 - 1.49 Final  . Color, Urine 09/16/2012 YELLOW  YELLOW Final  . APPearance 09/16/2012 CLEAR  CLEAR Final  . Specific Gravity, Urine 09/16/2012 1.028  1.005 - 1.030 Final  . pH 09/16/2012 6.0  5.0 - 8.0 Final  . Glucose, UA 09/16/2012 NEGATIVE  NEGATIVE mg/dL Final  . Hgb urine dipstick 09/16/2012 NEGATIVE  NEGATIVE Final  . Bilirubin Urine 09/16/2012 NEGATIVE  NEGATIVE Final  . Ketones, ur 09/16/2012 NEGATIVE  NEGATIVE mg/dL Final  . Protein, ur 14/78/2956 NEGATIVE  NEGATIVE mg/dL Final  . Urobilinogen, UA 09/16/2012 1.0  0.0 - 1.0 mg/dL Final  . Nitrite 21/30/8657 NEGATIVE  NEGATIVE Final  . Leukocytes, UA  09/16/2012 NEGATIVE  NEGATIVE Final   MICROSCOPIC NOT DONE ON URINES WITH NEGATIVE PROTEIN, BLOOD, LEUKOCYTES, NITRITE, OR GLUCOSE <1000 mg/dL.  . ABO/RH(D) 09/16/2012 O POS   Final  . Antibody Screen 09/16/2012 NEG   Final  . Sample Expiration 09/16/2012 09/24/2012   Final  . ABO/RH(D) 09/16/2012 O POS   Final     X-Rays:No results found.  EKG: Orders placed in visit on 07/10/12  . EKG 12-LEAD     Hospital Course: Alice Howard is a 67 y.o. who was admitted to [facility]. They were brought to the operating room on 09/21/2012 and underwent Procedure(s): LEFT TOTAL KNEE ARTHROPLASTY WITH HARDWARE REMOVAL.  Patient tolerated the procedure well and was later transferred to the recovery room and then to the orthopaedic floor for postoperative care.  They were  given PO and IV analgesics for pain control following their surgery.  They were given 24 hours of postoperative antibiotics of  Anti-infectives   Start     Dose/Rate Route Frequency Ordered Stop   09/21/12 1530  ceFAZolin (ANCEF) IVPB 1 g/50 mL premix     1 g 100 mL/hr over 30 Minutes Intravenous Every 6 hours 09/21/12 1130 09/21/12 2213   09/21/12 0700  ceFAZolin (ANCEF) IVPB 2 g/50 mL premix     2 g 100 mL/hr over 30 Minutes Intravenous On call to O.R. 09/21/12 1610 09/21/12 0914     and started on DVT prophylaxis in the form of Xarelto.   PT and OT were ordered for total joint protocol.  Discharge planning consulted to help with postop disposition and equipment needs.  Patient had a rough night on the evening of surgery.  They started to get up OOB with therapy on day one walking about 75 feet. Hemovac drain was pulled without difficulty.  Continued to work with therapy into day two.  Dressing was changed on day two and the incision was healing well.  Patient was seen in rounds and was ready to go home later that same day.   Discharge Medications: Prior to Admission medications   Medication Sig Start Date End Date Taking?  Authorizing Provider  acetaminophen (TYLENOL) 500 MG tablet Take 500 mg by mouth every 6 (six) hours as needed for pain.   Yes Historical Provider, MD  hydrocortisone (PROCTOCORT) 1 % CREA Apply 1 application topically daily as needed (for bug bites).   Yes Historical Provider, MD  simvastatin (ZOCOR) 20 MG tablet Take 20 mg by mouth every evening.   Yes Historical Provider, MD  cyclobenzaprine (FLEXERIL) 5 MG tablet Take 1 tablet (5 mg total) by mouth 3 (three) times daily as needed for muscle spasms. 09/23/12   Alexzandrew Perkins, PA-C  oxyCODONE (OXY IR/ROXICODONE) 5 MG immediate release tablet Take 1-2 tablets (5-10 mg total) by mouth every 3 (three) hours as needed. 09/23/12   Alexzandrew Julien Girt, PA-C  rivaroxaban (XARELTO) 10 MG TABS tablet Take 1 tablet (10 mg total) by mouth daily with breakfast. Take Xarelto for two and a half more weeks, then discontinue Xarelto. Once the patient has completed the Xarelto, they may resume the 81 mg Aspirin. 09/23/12   Alexzandrew Perkins, PA-C  traMADol (ULTRAM) 50 MG tablet Take 1-2 tablets (50-100 mg total) by mouth every 6 (six) hours as needed (mild pain). 09/23/12   Alexzandrew Julien Girt, PA-C    Diet: Cardiac diet Activity:WBAT Follow-up:in 2 weeks Disposition - Home Discharged Condition: good       Discharge Orders   Future Orders Complete By Expires     Call MD / Call 911  As directed     Comments:      If you experience chest pain or shortness of breath, CALL 911 and be transported to the hospital emergency room.  If you develope a fever above 101 F, pus (white drainage) or increased drainage or redness at the wound, or calf pain, call your surgeon's office.    Change dressing  As directed     Comments:      Change dressing daily with sterile 4 x 4 inch gauze dressing and apply TED hose. Do not submerge the incision under water.    Constipation Prevention  As directed     Comments:      Drink plenty of fluids.  Prune juice may be helpful.   You may use  a stool softener, such as Colace (over the counter) 100 mg twice a day.  Use MiraLax (over the counter) for constipation as needed.    Diet - low sodium heart healthy  As directed     Discharge instructions  As directed     Comments:      Pick up stool softner and laxative for home. Do not submerge incision under water. May shower. Continue to use ice for pain and swelling from surgery.  Take Xarelto for two and a half more weeks, then discontinue Xarelto. Once the patient has completed the Xarelto, they may resume the 81 mg Aspirin.    Do not put a pillow under the knee. Place it under the heel.  As directed     Do not sit on low chairs, stoools or toilet seats, as it may be difficult to get up from low surfaces  As directed     Driving restrictions  As directed     Comments:      No driving until released by the physician.    Increase activity slowly as tolerated  As directed     Lifting restrictions  As directed     Comments:      No lifting until released by the physician.    Patient may shower  As directed     Comments:      You may shower without a dressing once there is no drainage.  Do not wash over the wound.  If drainage remains, do not shower until drainage stops.    TED hose  As directed     Comments:      Use stockings (TED hose) for 3 weeks on both leg(s).  You may remove them at night for sleeping.    Weight bearing as tolerated  As directed         Medication List    STOP taking these medications       aspirin 81 MG tablet     fish oil-omega-3 fatty acids 1000 MG capsule     ibandronate 150 MG tablet  Commonly known as:  BONIVA     multivitamin with minerals Tabs      TAKE these medications       acetaminophen 500 MG tablet  Commonly known as:  TYLENOL  Take 500 mg by mouth every 6 (six) hours as needed for pain.     cyclobenzaprine 5 MG tablet  Commonly known as:  FLEXERIL  Take 1 tablet (5 mg total) by mouth 3 (three) times daily as  needed for muscle spasms.     hydrocortisone 1 % Crea  Commonly known as:  PROCTOCORT  Apply 1 application topically daily as needed (for bug bites).     oxyCODONE 5 MG immediate release tablet  Commonly known as:  Oxy IR/ROXICODONE  Take 1-2 tablets (5-10 mg total) by mouth every 3 (three) hours as needed.     rivaroxaban 10 MG Tabs tablet  Commonly known as:  XARELTO  - Take 1 tablet (10 mg total) by mouth daily with breakfast. Take Xarelto for two and a half more weeks, then discontinue Xarelto.  - Once the patient has completed the Xarelto, they may resume the 81 mg Aspirin.     simvastatin 20 MG tablet  Commonly known as:  ZOCOR  Take 20 mg by mouth every evening.     traMADol 50 MG tablet  Commonly known as:  ULTRAM  Take 1-2 tablets (50-100 mg total) by  mouth every 6 (six) hours as needed (mild pain).       Follow-up Information   Follow up with Loanne Drilling, MD. Schedule an appointment as soon as possible for a visit on 10/06/2012.   Contact information:   [address] [place] [address] [phone]       Signed: Patrica Duel 10/02/2012, 9:00 AM

## 2012-09-23 NOTE — Progress Notes (Signed)
Physical Therapy Treatment Patient Details Name: Alice Howard MRN: [id number] DOB: [birth year] Today's Date: 09/23/2012 Time: 1478-2956 PT Time Calculation (min): 38 min  PT Assessment / Plan / Recommendation  PT Comments     Follow Up Recommendations  Home health PT     Does the patient have the potential to tolerate intense rehabilitation     Barriers to Discharge        Equipment Recommendations  Rolling walker with 5" wheels    Recommendations for Other Services    Frequency 7X/week   Progress towards PT Goals Progress towards PT goals: Progressing toward goals  Plan Current plan remains appropriate    Precautions / Restrictions Precautions Precautions: Knee Required Braces or Orthoses: Knee Immobilizer - Left Knee Immobilizer - Left: Discontinue once straight leg raise with < 10 degree lag (Pt performed IND SLR this am) Restrictions Weight Bearing Restrictions: No LLE Weight Bearing: Weight bearing as tolerated   Pertinent Vitals/Pain 5/10; premed, ice packs provided    Mobility  Bed Mobility Bed Mobility: Not assessed (pt OOB with OT) Transfers Transfers: Sit to Stand;Stand to Sit Sit to Stand: 5: Supervision Stand to Sit: 5: Supervision Details for Transfer Assistance: cue for extending leg Ambulation/Gait Ambulation/Gait Assistance: 5: Supervision Ambulation Distance (Feet): 200 Feet Assistive device: Rolling walker Ambulation/Gait Assistance Details: cues for posture, position from RW Gait Pattern: Step-to pattern;Step-through pattern Stairs: No Stairs Assistance Details (indicate cue type and reason): Pt declines to practice - states she feels comfortable with ability - written instructions provided    Exercises Total Joint Exercises Ankle Circles/Pumps: AROM;Both;Supine;20 reps Quad Sets: AROM;Both;Supine;20 reps Gluteal Sets: AROM;Both;10 reps;Supine Heel Slides: AAROM;Left;Supine;20 reps Straight Leg Raises: AAROM;Left;Supine;AROM;20 reps Long  Arc Quad: AAROM;10 reps;Seated;Both;AROM   PT Diagnosis:    PT Problem List:   PT Treatment Interventions:     PT Goals (current goals can now be found in the care plan section) Acute Rehab PT Goals Patient Stated Goal: to get rid of knee pain PT Goal Formulation: With patient Time For Goal Achievement: 10/05/12 Potential to Achieve Goals: Good  Visit Information  Last PT Received On: 09/23/12 Assistance Needed: +1    Subjective Data  Patient Stated Goal: to get rid of knee pain   Cognition  Cognition Arousal/Alertness: Awake/alert Behavior During Therapy: WFL for tasks assessed/performed Overall Cognitive Status: Within Functional Limits for tasks assessed    Balance     End of Session PT - End of Session Activity Tolerance: Patient tolerated treatment well Patient left: in chair;with family/visitor present Nurse Communication: Mobility status CPM Left Knee CPM Left Knee: Off   GP     Erle Guster 09/23/2012, 12:14 PM

## 2012-09-23 NOTE — Progress Notes (Signed)
HHPT services have been set up with [facility].  Algernon Huxley RN BSN  [phone]

## 2012-09-23 NOTE — Evaluation (Signed)
Occupational Therapy Evaluation Patient Details Name: Alice Howard MRN: [id number] DOB: Jul 22, 1945 Today's Date: 09/23/2012 Time: 4696-2952 OT Time Calculation (min): 32 min  OT Assessment / Plan / Recommendation History of present illness 67 yo female admitted for elective LTKR.    Clinical Impression   Pt was admitted for L TKA.  All education was completed.  Pt does not feel that she'll need 3:1--she has a vanity next to toilet.  Husband will help as needed.      OT Assessment  Patient does not need any further OT services    Follow Up Recommendations  No OT follow up    Barriers to Discharge      Equipment Recommendations  None recommended by OT    Recommendations for Other Services    Frequency       Precautions / Restrictions Precautions Precautions: Knee Restrictions LLE Weight Bearing: Weight bearing as tolerated   Pertinent Vitals/Pain No c/o pain    ADL  Grooming: Supervision/safety Where Assessed - Grooming: Supported standing Upper Body Bathing: Set up Where Assessed - Upper Body Bathing: Unsupported sitting Lower Body Bathing: Minimal assistance Where Assessed - Lower Body Bathing: Supported sit to stand Upper Body Dressing: Set up Where Assessed - Upper Body Dressing: Unsupported sitting Lower Body Dressing: Moderate assistance Where Assessed - Lower Body Dressing: Supported sit to Pharmacist, hospital: Supervision/safety Statistician Method: Sit to stand Toileting - Architect and Hygiene: Supervision/safety Where Assessed - Engineer, mining and Hygiene: Sit to stand from 3-in-1 or toilet Tub/Shower Transfer: Supervision/safety Tub/Shower Transfer Method: Science writer: Walk in Scientist, research (physical sciences) Used: Theatre stage manager Transfers/Ambulation Related to ADLs: cues to extend leg only for sit to stand ADL Comments: worked through [employer].  Educated on reacher:  pt needs a little help with  washing bottom of feet, and assist with socks but can don underwear.  Will have husband assist her    OT Diagnosis:    OT Problem List:   OT Treatment Interventions:     OT Goals(Current goals can be found in the care plan section)    Visit Information  Last OT Received On: 09/23/12 Assistance Needed: +1 History of Present Illness: 67 yo female admitted for elective LTKR.        Prior Functioning     Home Living Family/patient expects to be discharged to:: Private residence Living Arrangements: Spouse/significant other Home Equipment: Shower seat Prior Function Level of Independence: Independent Communication Communication: No difficulties Dominant Hand: Right         Vision/Perception     Cognition  Cognition Arousal/Alertness: Awake/alert Behavior During Therapy: WFL for tasks assessed/performed Overall Cognitive Status: Within Functional Limits for tasks assessed    Extremity/Trunk Assessment Upper Extremity Assessment Upper Extremity Assessment: Overall WFL for tasks assessed     Mobility Bed Mobility Supine to Sit: HOB flat Transfers Sit to Stand: 5: Supervision Stand to Sit: 5: Supervision Details for Transfer Assistance: cue for extending leg     Exercise     Balance Static Standing Balance Static Standing - Balance Support: Left upper extremity supported Static Standing - Level of Assistance: 6: Modified independent (Device/Increase time)   End of Session OT - End of Session Activity Tolerance: Patient tolerated treatment well Patient left: in chair;with call bell/phone within reach  GO     [facility] 09/23/2012, 8:38 AM Marica Otter, OTR/L [phone] 09/23/2012

## 2012-09-23 NOTE — Progress Notes (Signed)
   Subjective: 2 Days Post-Op Procedure(s) (LRB): LEFT TOTAL KNEE ARTHROPLASTY WITH HARDWARE REMOVAL (Left) Patient reports pain as mild.   Patient seen in rounds with Dr. Lequita Halt. Patient is well, and has had no acute complaints or problems Patient is ready to go home.  Objective: Vital signs in last 24 hours: Temp:  [97.2 F (36.2 C)-98.7 F (37.1 C)] 98.7 F (37.1 C) (07/16 0530) Pulse Rate:  [69-74] 72 (07/16 0530) Resp:  [1-18] 14 (07/16 0530) BP: (100-131)/(63-77) 131/77 mmHg (07/16 0530) SpO2:  [73 %-95 %] 94 % (07/16 0530)  Intake/Output from previous day:  Intake/Output Summary (Last 24 hours) at 09/23/12 0710 Last data filed at 09/23/12 0531  Gross per 24 hour  Intake   1650 ml  Output   1950 ml  Net   -300 ml    Intake/Output this shift:    Labs:  Recent Labs  09/22/12 0455 09/23/12 0459  HGB 10.6* 9.9*    Recent Labs  09/22/12 0455 09/23/12 0459  WBC 11.6* 9.9  RBC 3.44* 3.19*  HCT 31.4* 29.3*  PLT 148* 141*    Recent Labs  09/22/12 0455 09/23/12 0459  NA 134* 138  K 3.7 3.8  CL 102 103  CO2 24 29  BUN 10 11  CREATININE 0.60 0.60  GLUCOSE 141* 120*  CALCIUM 8.3* 8.7   No results found for this basename: LABPT, INR,  in the last 72 hours  EXAM: General - Patient is Alert, Appropriate and Oriented Extremity - Neurologically intact Neurovascular intact Sensation intact distally No cellulitis present Incision - clean, dry, no drainage, healing Motor Function - intact, moving foot and toes well on exam.   Assessment/Plan: 2 Days Post-Op Procedure(s) (LRB): LEFT TOTAL KNEE ARTHROPLASTY WITH HARDWARE REMOVAL (Left) Procedure(s) (LRB): LEFT TOTAL KNEE ARTHROPLASTY WITH HARDWARE REMOVAL (Left) Past Medical History  Diagnosis Date  . Hyperlipidemia   . Osteoporosis   . History of colonic polyps     multiple Cscopes,  . Allergic rhinitis   . OA (osteoarthritis)     sees Ortho, 2011 they talked about TKR L  . Baker's cyst    at the L per MRI ordered by ortho aprox 2008 patient thinks is B  . Vertigo    Principal Problem:   OA (osteoarthritis) of knee Active Problems:   Postoperative anemia due to acute blood loss   Postop Hyponatremia  Estimated body mass index is 27.46 kg/(m^2) as calculated from the following:   Height as of this encounter: 5' 2.99" (1.6 m).   Weight as of this encounter: 70.3 kg (154 lb 15.7 oz). Up with therapy Discharge home with home health Diet - Cardiac diet Follow up - in 2 weeks Activity - WBAT Disposition - Home Condition Upon Discharge - Good D/C Meds - See DC Summary DVT Prophylaxis - Xarelto  PERKINS, ALEXZANDREW 09/23/2012, 7:10 AM

## 2012-09-23 NOTE — Plan of Care (Signed)
Problem: Discharge Progression Outcomes Goal: Anticoagulant follow-up in place Outcome: Not Applicable Date Met:  09/23/12 xarelto

## 2012-09-23 NOTE — Progress Notes (Signed)
Discharged from floor via w/c, spouse with pt. No changes in assessment. Alice Howard   

## 2012-09-24 NOTE — Progress Notes (Signed)
Utilization review completed.  

## 2012-10-12 ENCOUNTER — Ambulatory Visit: Payer: Medicare PPO | Attending: Orthopedic Surgery | Admitting: Physical Therapy

## 2012-10-12 DIAGNOSIS — IMO0001 Reserved for inherently not codable concepts without codable children: Secondary | ICD-10-CM | POA: Insufficient documentation

## 2012-10-12 DIAGNOSIS — M25669 Stiffness of unspecified knee, not elsewhere classified: Secondary | ICD-10-CM | POA: Insufficient documentation

## 2012-10-12 DIAGNOSIS — M25569 Pain in unspecified knee: Secondary | ICD-10-CM | POA: Insufficient documentation

## 2012-10-14 ENCOUNTER — Ambulatory Visit: Payer: Medicare PPO | Admitting: Physical Therapy

## 2012-10-16 ENCOUNTER — Ambulatory Visit: Payer: Medicare PPO | Admitting: Physical Therapy

## 2012-10-19 ENCOUNTER — Ambulatory Visit: Payer: Medicare PPO | Admitting: Physical Therapy

## 2012-10-21 ENCOUNTER — Ambulatory Visit: Payer: Medicare PPO | Admitting: Physical Therapy

## 2012-10-23 ENCOUNTER — Ambulatory Visit: Payer: Medicare PPO | Admitting: Physical Therapy

## 2012-10-26 ENCOUNTER — Ambulatory Visit: Payer: Medicare PPO | Admitting: Physical Therapy

## 2012-10-28 ENCOUNTER — Ambulatory Visit: Payer: Medicare PPO | Admitting: Physical Therapy

## 2012-12-09 ENCOUNTER — Encounter: Payer: Self-pay | Admitting: Internal Medicine

## 2012-12-09 ENCOUNTER — Ambulatory Visit (INDEPENDENT_AMBULATORY_CARE_PROVIDER_SITE_OTHER): Payer: Medicare PPO | Admitting: Internal Medicine

## 2012-12-09 VITALS — BP 102/65 | HR 87 | Temp 99.0°F | Wt 154.0 lb

## 2012-12-09 DIAGNOSIS — B349 Viral infection, unspecified: Secondary | ICD-10-CM

## 2012-12-09 DIAGNOSIS — B9789 Other viral agents as the cause of diseases classified elsewhere: Secondary | ICD-10-CM

## 2012-12-09 DIAGNOSIS — E785 Hyperlipidemia, unspecified: Secondary | ICD-10-CM

## 2012-12-09 MED ORDER — SIMVASTATIN 20 MG PO TABS: 20.0000 mg | ORAL_TABLET | Freq: Every evening | ORAL | Status: DC

## 2012-12-09 MED ORDER — HYDROCODONE-HOMATROPINE 5-1.5 MG/5ML PO SYRP: 5.0000 mL | ORAL_SOLUTION | Freq: Four times a day (QID) | ORAL | Status: DC | PRN

## 2012-12-09 MED ORDER — AMOXICILLIN 500 MG PO CAPS: 1000.0000 mg | ORAL_CAPSULE | Freq: Two times a day (BID) | ORAL | Status: DC

## 2012-12-09 NOTE — Assessment & Plan Note (Signed)
Likely a viral syndrome, had other patients with similar findings today, will treat conservatively, antibiotics if not improving, see instructions

## 2012-12-09 NOTE — Patient Instructions (Addendum)
Rest, fluids , tylenol For cough, take Mucinex DM twice a day as needed  If the cough continue, take hydrocodone, will cause drowsiness  For congestion use a saline nasal spray Take the antibiotic as prescribed  (Amoxicillin) only if no better in few days Call if no improvoing Call anytime if the symptoms are severe, you have high fever, short of breath, chest pain  Schedule  A physical at your convenience

## 2012-12-09 NOTE — Assessment & Plan Note (Signed)
Refill medications, needs a complete physical ans labs, patient will schedule at her convenience

## 2012-12-09 NOTE — Progress Notes (Signed)
  Subjective:    Patient ID: Alice Howard, female    DOB: 1946-02-29, 67 y.o.   MRN: [id number]  HPI Acute visit Symptoms started last week with on-off subjective fever, feeling unwell, occasionally sweats at night. Initially had sore throat but that is gone. Has not been taking anything in particular for her symptoms. High cholesterol--Needs a refill Status post a knee replacement doing great.   Past Medical History  Diagnosis Date  . Hyperlipidemia   . Osteoporosis   . History of colonic polyps     multiple Cscopes,  . Allergic rhinitis   . OA (osteoarthritis)     sees Ortho, 2011 they talked about TKR L  . Baker's cyst     at the L per MRI ordered by ortho aprox 2008 patient thinks is B  . Vertigo    Past Surgical History  Procedure Laterality Date  . Hemorrhoid surgery  1984  . Tubal ligation    . Knee surgery  1994    due to skiing accident.  Has 2 screws in knee. Gets Euflexxa injections.  . Colonoscopy    . Total knee arthroplasty Left 09/21/2012    Procedure: LEFT TOTAL KNEE ARTHROPLASTY WITH HARDWARE REMOVAL;  Surgeon: Loanne Drilling, MD;  Location: [facility];  Service: Orthopedics;  Laterality: Left;   History   Social History  . Marital Status: Married    Spouse Name: N/A    Number of Children: 2  . Years of Education: N/A   Occupational History  . curriculum facilitator-- retired 2012  [employer]   Social History Main Topics  . Smoking status: Never Smoker   . Smokeless tobacco: Never Used  . Alcohol Use: 0.0 oz/week     Comment: 2 times a week, wine  . Drug Use: No  . Sexual Activity: Not on file   Other Topics Concern  . Not on file   Social History Narrative   Works part time   Exercise- +4/week   Diet- healthy    Review of Systems Denies chest pain or shortness or breath No wheezing. + Cough, mostly at night, some sputum production, color? Had some myalgias with the onset of symptoms but not anymore. + Facial pressure,  blowing some clear discharge.    Objective:   Physical Exam BP 102/65  Pulse 87  Temp(Src) 99 F (37.2 C)  Wt 154 lb (69.854 kg)  BMI 27.29 kg/m2  SpO2 96% General -- alert, well-developed, NAD.   HEENT--   TMs R normal, L obscured by a narrow canal and wax, no d/c;, throat symmetric, no redness or discharge. Face symmetric, sinuses not tender to palpation. Nose + congested.  Lungs -- normal respiratory effort, no intercostal retractions, no accessory muscle use, and normal breath sounds.  Heart-- normal rate, regular rhythm, no murmur.   Psych-- Cognition and judgment appear intact. Cooperative with normal attention span and concentration. No anxious appearing , no depressed appearing.      Assessment & Plan:

## 2013-01-15 ENCOUNTER — Telehealth: Payer: Self-pay

## 2013-01-15 NOTE — Telephone Encounter (Addendum)
Left message for call back identifiable  Medication and allergies: reviewed and updated  90 day supply/mail order: na Local pharmacy: [facility] on [address]   Immunizations due:  pneumonia  A/P:   No changes to FH; 09/2012 knee surgery recorded in [facility] Bone Density---11/2011 MMG--04/16/2012--neg CCS--02/2012--Dr Hayes--next 02/2017 Tdap--2007 Shingles--2012 Flu vaccine--12/2012  To Discuss with Provider: Not at this time

## 2013-01-18 ENCOUNTER — Encounter: Payer: Self-pay | Admitting: Internal Medicine

## 2013-01-18 ENCOUNTER — Other Ambulatory Visit: Payer: Self-pay | Admitting: Internal Medicine

## 2013-01-18 ENCOUNTER — Ambulatory Visit (INDEPENDENT_AMBULATORY_CARE_PROVIDER_SITE_OTHER): Payer: Medicare PPO | Admitting: Internal Medicine

## 2013-01-18 VITALS — BP 116/78 | HR 87 | Temp 98.5°F | Ht 63.8 in | Wt 155.6 lb

## 2013-01-18 DIAGNOSIS — M1990 Unspecified osteoarthritis, unspecified site: Secondary | ICD-10-CM

## 2013-01-18 DIAGNOSIS — Z0000 Encounter for general adult medical examination without abnormal findings: Secondary | ICD-10-CM

## 2013-01-18 DIAGNOSIS — E785 Hyperlipidemia, unspecified: Secondary | ICD-10-CM

## 2013-01-18 DIAGNOSIS — M810 Age-related osteoporosis without current pathological fracture: Secondary | ICD-10-CM

## 2013-01-18 DIAGNOSIS — D62 Acute posthemorrhagic anemia: Secondary | ICD-10-CM

## 2013-01-18 DIAGNOSIS — Z23 Encounter for immunization: Secondary | ICD-10-CM

## 2013-01-18 MED ORDER — IBANDRONATE SODIUM 150 MG PO TABS: 150.0000 mg | ORAL_TABLET | ORAL | Status: DC

## 2013-01-18 NOTE — Progress Notes (Signed)
Subjective:    Patient ID: Alice Howard, female    DOB: 1945-05-06, 67 y.o.   MRN: [id number]  HPI Here for Medicare AWV: 1. Risk factors based on Past M, S, F history: reviewed 2. Physical Activities:  Active, swimms, gym 3. Depression/mood: neg screening  4. Hearing:  No problemss noted or reported  5. ADL's:  Independent , driving  6. Fall Risk: low risk, see instructions  7. home Safety: does feel safe at home  8. Height, weight, & visual acuity: see VS, normal vision, uses contacts, sees eye doctor 9. Counseling: provided 10. Labs ordered based on risk factors: if needed  11. Referral Coordination: if needed 12. Care Plan, see assessment and plan  13. Cognitive Assessment:  In addition, today we discussed the following: Hyperlipidemia--good medication compliance Osteoporosis-- on Boniva, no complaints. DJD--status post Total knee replacement, doing great, she is back swimming and walking.   Past Medical History  Diagnosis Date  . Hyperlipidemia   . Osteoporosis   . History of colonic polyps     multiple Cscopes,  . Allergic rhinitis   . OA (osteoarthritis)     sees Ortho, 2011 they talked about TKR L  . Baker's cyst     at the L per MRI ordered by ortho aprox 2008 patient thinks is B  . Vertigo    Past Surgical History  Procedure Laterality Date  . Hemorrhoid surgery  1984  . Tubal ligation    . Knee surgery  1994    due to skiing accident.  Has 2 screws in knee. Gets Euflexxa injections.  . Colonoscopy    . Total knee arthroplasty Left 09/21/2012    Procedure: LEFT TOTAL KNEE ARTHROPLASTY WITH HARDWARE REMOVAL;  Surgeon: Loanne Drilling, MD;  Location: [facility];  Service: Orthopedics;  Laterality: Left;   Family History  Problem Relation Age of Onset  . Heart attack Other 70    GMx 2   . Breast cancer Neg Hx   . Colon cancer Mother   . Hypertension Mother   . Cancer Father     "myeloid metaplasia? similar to leukemia"   History   Social History  .  Marital Status: Married    Spouse Name: N/A    Number of Children: 2  . Years of Education: N/A   Occupational History  . curriculum facilitator-- retired 2012  [employer]   Social History Main Topics  . Smoking status: Never Smoker   . Smokeless tobacco: Never Used  . Alcohol Use: 0.0 oz/week     Comment: 2 times a week, wine  . Drug Use: No  . Sexual Activity: Not on file   Other Topics Concern  . Not on file   Social History Narrative   Works part time           Review of Systems No  CP, SOB, DOE although has noted less stamina in general Denies  nausea, vomiting diarrhea Denies  blood in the stools No dysuria, gross hematuria, difficulty urinating      Objective:   Physical Exam BP 116/78  Pulse 87  Temp(Src) 98.5 F (36.9 C)  Ht 5' 3.8" (1.621 m)  Wt 155 lb 9.6 oz (70.58 kg)  BMI 26.86 kg/m2  SpO2 97% General -- alert, well-developed, NAD.  Neck --no thyromegaly , normal carotid pulse Breast-- no dominant mass, skin and nipples normal to inspection on palpation, axillary areas without mass or lymphadenopathy Lungs -- normal respiratory effort, no  intercostal retractions, no accessory muscle use, and normal breath sounds.  Heart-- normal rate, regular rhythm, no murmur.  Abdomen-- Not distended, good bowel sounds,soft, non-tender. No rebound or rigidity. No mass,organomegaly. Extremities-- no pretibial edema bilaterally  Neurologic--  alert & oriented X3. Speech normal, gait normal, strength normal in all extremities.  Psych-- Cognition and judgment appear intact. Cooperative with normal attention span and concentration. No anxious appearing , no depressed appearing.        Assessment & Plan:

## 2013-01-18 NOTE — Assessment & Plan Note (Addendum)
Bone density 12-2011 did show improvement Plan: Continue Boniva, calcium and vitamin D

## 2013-01-18 NOTE — Progress Notes (Signed)
Pre visit review using our clinic review tool, if applicable. No additional management support is needed unless otherwise documented below in the visit note. 

## 2013-01-18 NOTE — Assessment & Plan Note (Signed)
Labs

## 2013-01-18 NOTE — Assessment & Plan Note (Addendum)
Td 07 Had a zostavax Pneumonia 2013 flu shot  12-2012 UTD on Cscope , next 02-2017 per GI (Dr Madilyn Fireman) Paps consistently normal, last 2012  Will discuss next year Mammogram  04-2012 negative, breast exam normal today Labs Including a CBC d/t  postop anemia Continue with her healthy lifestyle, diet-exercise discussed

## 2013-01-18 NOTE — Assessment & Plan Note (Signed)
Good medication compliance, check FLP, AST ALT

## 2013-01-18 NOTE — Assessment & Plan Note (Signed)
Status post total knee replacement, doing great

## 2013-01-18 NOTE — Patient Instructions (Signed)
Get your blood work: FLP, AST, ALT--- dx  high cholesterol CBC --- dx anemia TSH --- dx osteoporosis   Next visit in 1 year for a physical exam . Fasting Please make an appointment    Fall Prevention and Home Safety Falls cause injuries and can affect all age groups. It is possible to use preventive measures to significantly decrease the likelihood of falls. There are many simple measures which can make your home safer and prevent falls. OUTDOORS  Repair cracks and edges of walkways and driveways.  Remove high doorway thresholds.  Trim shrubbery on the main path into your home.  Have good outside lighting.  Clear walkways of tools, rocks, debris, and clutter.  Check that handrails are not broken and are securely fastened. Both sides of steps should have handrails.  Have leaves, snow, and ice cleared regularly.  Use sand or salt on walkways during winter months.  In the garage, clean up grease or oil spills. BATHROOM  Install night lights.  Install grab bars by the toilet and in the tub and shower.  Use non-skid mats or decals in the tub or shower.  Place a plastic non-slip stool in the shower to sit on, if needed.  Keep floors dry and clean up all water on the floor immediately.  Remove soap buildup in the tub or shower on a regular basis.  Secure bath mats with non-slip, double-sided rug tape.  Remove throw rugs and tripping hazards from the floors. BEDROOMS  Install night lights.  Make sure a bedside light is easy to reach.  Do not use oversized bedding.  Keep a telephone by your bedside.  Have a firm chair with side arms to use for getting dressed.  Remove throw rugs and tripping hazards from the floor. KITCHEN  Keep handles on pots and pans turned toward the center of the stove. Use back burners when possible.  Clean up spills quickly and allow time for drying.  Avoid walking on wet floors.  Avoid hot utensils and knives.  Position shelves so  they are not too high or low.  Place commonly used objects within easy reach.  If necessary, use a sturdy step stool with a grab bar when reaching.  Keep electrical cables out of the way.  Do not use floor polish or wax that makes floors slippery. If you must use wax, use non-skid floor wax.  Remove throw rugs and tripping hazards from the floor. STAIRWAYS  Never leave objects on stairs.  Place handrails on both sides of stairways and use them. Fix any loose handrails. Make sure handrails on both sides of the stairways are as long as the stairs.  Check carpeting to make sure it is firmly attached along stairs. Make repairs to worn or loose carpet promptly.  Avoid placing throw rugs at the top or bottom of stairways, or properly secure the rug with carpet tape to prevent slippage. Get rid of throw rugs, if possible.  Have an electrician put in a light switch at the top and bottom of the stairs. OTHER FALL PREVENTION TIPS  Wear low-heel or rubber-soled shoes that are supportive and fit well. Wear closed toe shoes.  When using a stepladder, make sure it is fully opened and both spreaders are firmly locked. Do not climb a closed stepladder.  Add color or contrast paint or tape to grab bars and handrails in your home. Place contrasting color strips on first and last steps.  Learn and use mobility aids as needed.  Install an electrical emergency response system.  Turn on lights to avoid dark areas. Replace light bulbs that burn out immediately. Get light switches that glow.  Arrange furniture to create clear pathways. Keep furniture in the same place.  Firmly attach carpet with non-skid or double-sided tape.  Eliminate uneven floor surfaces.  Select a carpet pattern that does not visually hide the edge of steps.  Be aware of all pets. OTHER HOME SAFETY TIPS  Set the water temperature for 120 F (48.8 C).  Keep emergency numbers on or near the telephone.  Keep smoke  detectors on every level of the home and near sleeping areas. Document Released: 02/15/2002 Document Revised: 08/27/2011 Document Reviewed: 05/17/2011 [facility] Patient Information 2014 [place].

## 2013-01-19 NOTE — Telephone Encounter (Signed)
Ibandronate refilled

## 2013-01-22 ENCOUNTER — Other Ambulatory Visit (INDEPENDENT_AMBULATORY_CARE_PROVIDER_SITE_OTHER): Payer: Medicare PPO

## 2013-01-22 DIAGNOSIS — Z0000 Encounter for general adult medical examination without abnormal findings: Secondary | ICD-10-CM

## 2013-01-22 DIAGNOSIS — E785 Hyperlipidemia, unspecified: Secondary | ICD-10-CM

## 2013-01-22 LAB — CBC WITH DIFFERENTIAL/PLATELET
Basophils Absolute: 0 10*3/uL (ref 0.0–0.1)
Eosinophils Absolute: 0.1 10*3/uL (ref 0.0–0.7)
Lymphocytes Relative: 33.5 % (ref 12.0–46.0)
MCHC: 33.5 g/dL (ref 30.0–36.0)
Neutro Abs: 2.8 10*3/uL (ref 1.4–7.7)
Neutrophils Relative %: 55.7 % (ref 43.0–77.0)
RDW: 13.4 % (ref 11.5–14.6)

## 2013-01-22 LAB — HEPATIC FUNCTION PANEL
AST: 23 U/L (ref 0–37)
Albumin: 4.2 g/dL (ref 3.5–5.2)
Alkaline Phosphatase: 58 U/L (ref 39–117)
Bilirubin, Direct: 0.1 mg/dL (ref 0.0–0.3)

## 2013-01-22 LAB — BASIC METABOLIC PANEL
CO2: 27 mEq/L (ref 19–32)
Calcium: 9.3 mg/dL (ref 8.4–10.5)
Creatinine, Ser: 0.6 mg/dL (ref 0.4–1.2)
Glucose, Bld: 96 mg/dL (ref 70–99)

## 2013-01-22 LAB — TSH: TSH: 0.66 u[IU]/mL (ref 0.35–5.50)

## 2013-01-22 LAB — LIPID PANEL: HDL: 58.6 mg/dL (ref >39.00)

## 2013-02-01 ENCOUNTER — Encounter: Payer: Self-pay | Admitting: *Deleted

## 2013-03-23 ENCOUNTER — Other Ambulatory Visit: Payer: Self-pay | Admitting: Internal Medicine

## 2013-03-23 DIAGNOSIS — Z1231 Encounter for screening mammogram for malignant neoplasm of breast: Secondary | ICD-10-CM

## 2013-04-19 ENCOUNTER — Ambulatory Visit (HOSPITAL_BASED_OUTPATIENT_CLINIC_OR_DEPARTMENT_OTHER)
Admission: RE | Admit: 2013-04-19 | Discharge: 2013-04-19 | Disposition: A | Discharge status: Home / Self Care | Payer: Medicare PPO | Source: Ambulatory Visit | Attending: Internal Medicine | Admitting: Internal Medicine

## 2013-04-19 DIAGNOSIS — Z1231 Encounter for screening mammogram for malignant neoplasm of breast: Secondary | ICD-10-CM | POA: Insufficient documentation

## 2013-05-03 ENCOUNTER — Ambulatory Visit: Payer: Medicare PPO | Attending: Orthopedic Surgery | Admitting: Physical Therapy

## 2013-05-03 DIAGNOSIS — Z96659 Presence of unspecified artificial knee joint: Secondary | ICD-10-CM | POA: Insufficient documentation

## 2013-05-03 DIAGNOSIS — IMO0001 Reserved for inherently not codable concepts without codable children: Secondary | ICD-10-CM | POA: Insufficient documentation

## 2013-05-03 DIAGNOSIS — M25569 Pain in unspecified knee: Secondary | ICD-10-CM | POA: Insufficient documentation

## 2013-05-07 ENCOUNTER — Ambulatory Visit: Payer: Medicare PPO | Admitting: Physical Therapy

## 2013-05-11 ENCOUNTER — Ambulatory Visit: Payer: Medicare PPO | Attending: Orthopedic Surgery | Admitting: Physical Therapy

## 2013-05-11 DIAGNOSIS — Z96659 Presence of unspecified artificial knee joint: Secondary | ICD-10-CM | POA: Insufficient documentation

## 2013-05-11 DIAGNOSIS — M25569 Pain in unspecified knee: Secondary | ICD-10-CM | POA: Insufficient documentation

## 2013-05-11 DIAGNOSIS — IMO0001 Reserved for inherently not codable concepts without codable children: Secondary | ICD-10-CM | POA: Insufficient documentation

## 2013-05-13 ENCOUNTER — Ambulatory Visit: Payer: Medicare PPO | Admitting: Physical Therapy

## 2013-05-17 ENCOUNTER — Ambulatory Visit: Payer: Medicare PPO | Admitting: Physical Therapy

## 2013-05-19 ENCOUNTER — Ambulatory Visit: Payer: Medicare PPO | Admitting: Physical Therapy

## 2013-05-20 ENCOUNTER — Ambulatory Visit: Payer: Medicare PPO | Admitting: Physical Therapy

## 2013-05-26 ENCOUNTER — Ambulatory Visit: Payer: Medicare PPO | Admitting: Physical Therapy

## 2013-05-27 ENCOUNTER — Ambulatory Visit: Payer: Medicare PPO | Admitting: Physical Therapy

## 2013-06-03 ENCOUNTER — Ambulatory Visit: Payer: Medicare PPO | Admitting: Physical Therapy

## 2013-08-18 ENCOUNTER — Other Ambulatory Visit: Payer: Self-pay | Admitting: Internal Medicine

## 2013-09-21 ENCOUNTER — Ambulatory Visit (INDEPENDENT_AMBULATORY_CARE_PROVIDER_SITE_OTHER): Payer: Medicare PPO | Admitting: Nurse Practitioner

## 2013-09-21 ENCOUNTER — Encounter: Payer: Self-pay | Admitting: Nurse Practitioner

## 2013-09-21 VITALS — BP 134/79 | HR 73 | Temp 97.8°F | Ht 63.8 in | Wt 163.0 lb

## 2013-09-21 DIAGNOSIS — H1033 Unspecified acute conjunctivitis, bilateral: Secondary | ICD-10-CM

## 2013-09-21 DIAGNOSIS — H10029 Other mucopurulent conjunctivitis, unspecified eye: Secondary | ICD-10-CM

## 2013-09-21 MED ORDER — POLYMYXIN B-TRIMETHOPRIM 10000-0.1 UNIT/ML-% OP SOLN: 1.0000 [drp] | OPHTHALMIC | Status: DC

## 2013-09-21 NOTE — Progress Notes (Signed)
Pre visit review using our clinic review tool, if applicable. No additional management support is needed unless otherwise documented below in the visit note. 

## 2013-09-21 NOTE — Patient Instructions (Signed)
Start eye drops daily. Use for 24 hours longer than you have symptoms. Wash lids & lashes daily with mixture of baby shampoo & water (1:1). Wear glasses until symptoms resolve. Use sinus rinse daily for nasal congestion. Please let us know if all symptoms do not resolve in 10 days or if you develop fever or chest pain with deep inspiration.

## 2013-09-21 NOTE — Progress Notes (Signed)
   Subjective:    Patient ID: Alice MealyLinda W Rohner, female    DOB: 04-10-1945, 68 y.o.   MRN: [id number]  Conjunctivitis  The current episode started 3 to 5 days ago (3d). The onset was sudden. The problem occurs continuously. The problem has been gradually worsening. The problem is moderate. Nothing relieves the symptoms. Nothing aggravates the symptoms. Associated symptoms include eye itching, congestion, cough (mild), URI, eye discharge and eye redness. Pertinent negatives include no fever, no decreased vision, no double vision, no photophobia, no abdominal pain, no diarrhea, no ear pain, no headaches, no hearing loss, no sore throat (scratchy), no muscle aches, no neck stiffness, no wheezing and no eye pain. Both eyes are affected.The eyelid exhibits redness. There were sick contacts at home (son-in-law, drand-daughter).      Review of Systems  Constitutional: Negative for fever.  HENT: Positive for congestion. Negative for ear pain, hearing loss and sore throat (scratchy).   Eyes: Positive for discharge, redness and itching. Negative for double vision, photophobia and pain.  Respiratory: Positive for cough (mild). Negative for wheezing.   Gastrointestinal: Negative for abdominal pain and diarrhea.  Neurological: Negative for headaches.       Objective:   Physical Exam  Vitals reviewed. Constitutional: She is oriented to person, place, and time. She appears well-developed and well-nourished. No distress.  HENT:  Head: Normocephalic and atraumatic.  Mouth/Throat: Oropharynx is clear and moist. No oropharyngeal exudate.  Unable to visualize L TM due to ceruminosis. R TM partially visualized due to ceruminosis.  Eyes: Pupils are equal, round, and reactive to light. Right eye exhibits discharge. Left eye exhibits no discharge.  Neck: Normal range of motion. Neck supple. No thyromegaly present.  Cardiovascular: Normal rate, regular rhythm and normal heart sounds.   No murmur  heard. Pulmonary/Chest: Effort normal and breath sounds normal. No respiratory distress. She has no wheezes. She has no rales.  Lymphadenopathy:    She has no cervical adenopathy.  Neurological: She is alert and oriented to person, place, and time.  Skin: Skin is warm and dry.  Psychiatric: She has a normal mood and affect. Her behavior is normal. Thought content normal.          Assessment & Plan:  1. Acute bacterial conjunctivitis of both eyes URI symptoms also. - trimethoprim-polymyxin b (POLYTRIM) ophthalmic solution; Place 1 drop into both eyes every 4 (four) hours.  Dispense: 10 mL; Refill: 0 See pt instructions. F/u PRN.

## 2013-10-04 ENCOUNTER — Encounter: Payer: Self-pay | Admitting: Medical

## 2013-10-04 ENCOUNTER — Ambulatory Visit (INDEPENDENT_AMBULATORY_CARE_PROVIDER_SITE_OTHER): Payer: Medicare PPO | Admitting: Medical

## 2013-10-04 VITALS — BP 110/76 | HR 73 | Temp 98.8°F | Wt 162.4 lb

## 2013-10-04 DIAGNOSIS — J0110 Acute frontal sinusitis, unspecified: Secondary | ICD-10-CM

## 2013-10-04 DIAGNOSIS — J019 Acute sinusitis, unspecified: Secondary | ICD-10-CM | POA: Insufficient documentation

## 2013-10-04 DIAGNOSIS — J309 Allergic rhinitis, unspecified: Secondary | ICD-10-CM

## 2013-10-04 MED ORDER — CEFDINIR 300 MG PO CAPS: 300.0000 mg | ORAL_CAPSULE | Freq: Two times a day (BID) | ORAL | Status: DC

## 2013-10-04 MED ORDER — BENZONATATE 100 MG PO CAPS: 100.0000 mg | ORAL_CAPSULE | Freq: Three times a day (TID) | ORAL | Status: DC | PRN

## 2013-10-04 NOTE — Patient Instructions (Signed)
Please take nasal steroid you have at home. Also otc claritin or zyrtec. I am prescribing cefdinir today for possible sinus infection following allergic rhinitis. Also prescribing benzonatate for cough. Follow up in 7-10 days or as needed.

## 2013-10-04 NOTE — Assessment & Plan Note (Signed)
Cefdinir rx for sinusitis. Benzonatate rx for cough.

## 2013-10-04 NOTE — Progress Notes (Signed)
   Subjective:    Patient ID: Alice MealyLinda W Howard, female    DOB: [birth year], 68 y.o.   MRN: [id number]  HPI  Pt in stating hx of nasal congestion, runny nose, pnd and cough. Has been sick for 2 wks. Pt states not improving. Pt does not described sneezing presently but some in beginning. Slight tinged mucous when she blows nose. Mild productive.  Pt tried netty pot and tried delsym for cough at night. Then tried alka seltzer cold and sinus.    Review of Systems  Constitutional: Positive for fever and fatigue. Negative for chills.       Subjective fever today earlier.  HENT: Positive for congestion, postnasal drip, rhinorrhea and sinus pressure. Negative for ear pain, facial swelling, sore throat and tinnitus.   Respiratory: Positive for cough. Negative for choking, chest tightness, shortness of breath and wheezing.   Cardiovascular: Negative for chest pain and palpitations.  Neurological: Negative.   Hematological: Negative for adenopathy. Does not bruise/bleed easily.       Objective:   Physical Exam  General  Mental Status - Alert. General Appearance - Well groomed. Not in acute distress.  Skin Rashes- No Rashes.  HEENT Head- Normal. Ear Auditory Canal - Left- Normal. Right - Normal.Tympanic Membrane- Left- Normal. Right- Normal. Eye Sclera/Conjunctiva- Left- Normal. Right- Normal. Nose & Sinuses Nasal Mucosa- Left-  Boggy and Congested. Right-  Boggy or Congested. Faint frontal and ethmoid sinus pain on palpation Mouth & Throat Lips: Upper Lip- Normal: no dryness, cracking, pallor, cyanosis, or vesicular eruption. Lower Lip-Normal: no dryness, cracking, pallor, cyanosis or vesicular eruption. Buccal Mucosa- Bilateral- No Aphthous ulcers. Oropharynx- No Discharge or Erythema. Tonsils: Characteristics- Bilateral- No Erythema or Congestion. Size/Enlargement- Bilateral- No enlargement. Discharge- bilateral-None.  Neck Neck- Supple. No Masses.   Chest and Lung  Exam Auscultation: Breath Sounds:-Normal, clear even and unlabored.  Cardiovascular Auscultation:Rythm- Regular.  Murmurs & Other Heart Sounds:Ausculatation of the heart reveal- No Murmurs.  Lymphatic Head & Neck General Head & Neck Lymphatics: Bilateral: Description- No Localized lymphadenopathy.         Assessment & Plan:

## 2013-10-04 NOTE — Progress Notes (Signed)
Pre visit review using our clinic review tool, if applicable. No additional management support is needed unless otherwise documented below in the visit note. 

## 2013-10-04 NOTE — Assessment & Plan Note (Signed)
Continue otc antihistamine and nasal steroid.

## 2013-10-19 ENCOUNTER — Telehealth: Payer: Self-pay | Admitting: Internal Medicine

## 2013-10-19 MED ORDER — BENZONATATE 100 MG PO CAPS: 100.0000 mg | ORAL_CAPSULE | Freq: Three times a day (TID) | ORAL | Status: DC | PRN

## 2013-10-19 NOTE — Telephone Encounter (Signed)
Left VM for pt to return call.

## 2013-10-19 NOTE — Telephone Encounter (Signed)
Caller name: Bonita QuinLinda Relation to pt: Call back number: [phone]  Or email -- [email or web address] [facility] [address]  Reason for call:   Pt saw Ramon Dredgedward on 7/27 for sinus and allergic rhinitis.  Pt emailed and stated she is still coughing and was hoping to get a refill on the Rx  benzonatate (TESSALON) 100 MG capsule.  They are going out of town and would like to have something for night time relief only.

## 2013-10-19 NOTE — Telephone Encounter (Signed)
Contacted pt.  Advised on Edwards response that Rx was sent to pharmacy and if she was not feeling better, she needed to come back in to be checked out again.  Pt understood.

## 2013-11-04 ENCOUNTER — Encounter: Payer: Self-pay | Admitting: Internal Medicine

## 2013-11-04 ENCOUNTER — Ambulatory Visit (INDEPENDENT_AMBULATORY_CARE_PROVIDER_SITE_OTHER): Payer: Medicare PPO | Admitting: Internal Medicine

## 2013-11-04 VITALS — BP 126/74 | HR 72 | Temp 97.9°F | Wt 163.0 lb

## 2013-11-04 DIAGNOSIS — L309 Dermatitis, unspecified: Secondary | ICD-10-CM

## 2013-11-04 DIAGNOSIS — L259 Unspecified contact dermatitis, unspecified cause: Secondary | ICD-10-CM

## 2013-11-04 MED ORDER — PREDNISONE 10 MG PO TABS: ORAL_TABLET | ORAL | Status: DC

## 2013-11-04 MED ORDER — TRIAMCINOLONE ACETONIDE 0.1 % EX LOTN: 1.0000 "application " | TOPICAL_LOTION | Freq: Three times a day (TID) | CUTANEOUS | Status: DC

## 2013-11-04 NOTE — Progress Notes (Signed)
Pre-visit discussion using our clinic review tool. No additional management support is needed unless otherwise documented below in the visit note.  

## 2013-11-04 NOTE — Patient Instructions (Addendum)
Take prednisone as prescribed Use a lotion 3 times  a day as needed for itching Continue Benadryl Get Zantac 75 milligrams OTC take one tablet twice a day until you stop itching. Call if not improving soon

## 2013-11-04 NOTE — Progress Notes (Signed)
   Subjective:    Patient ID: Alice Howard, female    DOB: 11-29-1945, 68 y.o.   MRN: [id number]  DOS:  11/04/2013 Type of visit - description: acute History: Developed a itchy rash the day after she took a walk at the [place] way, not sure if she was in contact w/ plants, poison oak?   ROS Denies fever, chills, joint aches. Lips and tongue not affected Not taking any new medications however she did change detergents.   Past Medical History  Diagnosis Date  . Hyperlipidemia   . Osteoporosis   . History of colonic polyps     multiple Cscopes,  . Allergic rhinitis   . OA (osteoarthritis)     sees Ortho, 2011 they talked about TKR L  . Baker's cyst     at the L per MRI ordered by ortho aprox 2008 patient thinks is B  . Vertigo     Past Surgical History  Procedure Laterality Date  . Hemorrhoid surgery  1984  . Tubal ligation    . Knee surgery  1994    due to skiing accident.  Has 2 screws in knee. Gets Euflexxa injections.  . Colonoscopy    . Total knee arthroplasty Left 09/21/2012    Procedure: LEFT TOTAL KNEE ARTHROPLASTY WITH HARDWARE REMOVAL;  Surgeon: Loanne Drilling, MD;  Location: [facility];  Service: Orthopedics;  Laterality: Left;    History   Social History  . Marital Status: Married    Spouse Name: N/A    Number of Children: 2  . Years of Education: N/A   Occupational History  . curriculum facilitator-- retired 2012  [employer]   Social History Main Topics  . Smoking status: Never Smoker   . Smokeless tobacco: Never Used  . Alcohol Use: 0.0 oz/week     Comment: 2 times a week, wine  . Drug Use: No  . Sexual Activity: Not on file   Other Topics Concern  . Not on file   Social History Narrative   Works part time              Medication List       This list is accurate as of: 11/04/13 10:06 PM.  Always use your most recent med list.               ibandronate 150 MG tablet  Commonly known as:  BONIVA  SEE NOTES     predniSONE 10 MG tablet  Commonly known as:  DELTASONE  4 tablets x 2 days, 3 tabs x 2 days, 2 tabs x 2 days, 1 tab x 2 days     simvastatin 20 MG tablet  Commonly known as:  ZOCOR  TAKE 1 TABLET BY MOUTH EVERY EVENING     triamcinolone lotion 0.1 %  Commonly known as:  KENALOG  Apply 1 application topically 3 (three) times daily.           Objective:   Physical Exam  Skin:      BP 126/74  Pulse 72  Temp(Src) 97.9 F (36.6 C) (Oral)  Wt 163 lb (73.936 kg)  SpO2 96% General -- alert, well-developed, NAD.   HEENT lips and tongue normal Psych-- Cognition and judgment appear intact. Cooperative with normal attention span and concentration. No anxious or depressed appearing.      Assessment & Plan:   Dermatitis, most likely contact dermatitis. Will treat with prednisone, Benadryl, Zantac. See instructions.

## 2014-01-13 ENCOUNTER — Encounter: Payer: Self-pay | Admitting: Internal Medicine

## 2014-01-13 ENCOUNTER — Ambulatory Visit (INDEPENDENT_AMBULATORY_CARE_PROVIDER_SITE_OTHER): Payer: Medicare PPO | Admitting: Internal Medicine

## 2014-01-13 VITALS — BP 116/76 | HR 67 | Temp 98.0°F | Wt 162.5 lb

## 2014-01-13 DIAGNOSIS — E669 Obesity, unspecified: Secondary | ICD-10-CM

## 2014-01-13 DIAGNOSIS — R5382 Chronic fatigue, unspecified: Secondary | ICD-10-CM

## 2014-01-13 DIAGNOSIS — Z0000 Encounter for general adult medical examination without abnormal findings: Secondary | ICD-10-CM

## 2014-01-13 DIAGNOSIS — R1031 Right lower quadrant pain: Secondary | ICD-10-CM

## 2014-01-13 DIAGNOSIS — M810 Age-related osteoporosis without current pathological fracture: Secondary | ICD-10-CM

## 2014-01-13 LAB — CBC WITH DIFFERENTIAL/PLATELET
BASOS ABS: 0 10*3/uL (ref 0.0–0.1)
Basophils Relative: 0.7 % (ref 0.0–3.0)
EOS ABS: 0.2 10*3/uL (ref 0.0–0.7)
Eosinophils Relative: 4.2 % (ref 0.0–5.0)
HEMATOCRIT: 41.1 % (ref 36.0–46.0)
Hemoglobin: 13.7 g/dL (ref 12.0–15.0)
LYMPHS ABS: 1.7 10*3/uL (ref 0.7–4.0)
Lymphocytes Relative: 29.7 % (ref 12.0–46.0)
MCHC: 33.3 g/dL (ref 30.0–36.0)
MCV: 94.1 fl (ref 78.0–100.0)
MONO ABS: 0.5 10*3/uL (ref 0.1–1.0)
Monocytes Relative: 8.8 % (ref 3.0–12.0)
Neutro Abs: 3.2 10*3/uL (ref 1.4–7.7)
Neutrophils Relative %: 56.6 % (ref 43.0–77.0)
PLATELETS: 190 10*3/uL (ref 150.0–400.0)
RBC: 4.36 Mil/uL (ref 3.87–5.11)
RDW: 11.9 % (ref 11.5–15.5)
WBC: 5.7 10*3/uL (ref 4.0–10.5)

## 2014-01-13 LAB — TSH: TSH: 0.97 u[IU]/mL (ref 0.35–4.50)

## 2014-01-13 LAB — VITAMIN D 25 HYDROXY (VIT D DEFICIENCY, FRACTURES): VITD: 35.43 ng/mL (ref 30.00–100.00)

## 2014-01-13 LAB — FOLATE

## 2014-01-13 LAB — VITAMIN B12: Vitamin B-12: 416 pg/mL (ref 211–911)

## 2014-01-13 NOTE — Assessment & Plan Note (Signed)
Saw gynecology [phone], had a pelvic and Pap smear. Had a flu shot Will schedule a physical, see instructions

## 2014-01-13 NOTE — Progress Notes (Signed)
Subjective:    Patient ID: Alice Howard, female    DOB: [birth year], 68 y.o.   MRN: [id number]  DOS:  01/13/2014 Type of visit - description : acute,has several concerns, chief complaint is pain Interval history: Continue with right lower abdominal pain since 2011, on and off, when it comes last one minute, happen when she is driving, resting, usually does not happen when she exercises. No change when she eats all have a bowel movement. Does not recall any rash in the area. No mass. Few months ago saw gynecology, see assessment and plan  Also trying to eat healthy, unable to lose weight  Also feels tired.   ROS Admits to mild snoring but denies feeling asleep in the morning or sleepy throughout the day. No anxiety, depression, no major worries. Marriage is in good health.  Past Medical History  Diagnosis Date  . Hyperlipidemia   . Osteoporosis   . History of colonic polyps     multiple Cscopes,  . Allergic rhinitis   . OA (osteoarthritis)     sees Ortho, 2011 they talked about TKR L  . Baker's cyst     at the L per MRI ordered by ortho aprox 2008 patient thinks is B  . Vertigo     Past Surgical History  Procedure Laterality Date  . Hemorrhoid surgery  1984  . Tubal ligation    . Knee surgery  1994    due to skiing accident.  Has 2 screws in knee. Gets Euflexxa injections.  . Colonoscopy    . 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 Marital Status: Married    Spouse Name: N/A    Number of Children: 2  . Years of Education: N/A   Occupational History  . curriculum facilitator-- retired 2012  [employer]   Social History Main Topics  . Smoking status: Never Smoker   . Smokeless tobacco: Never Used  . Alcohol Use: 0.0 oz/week     Comment: 2 times a week, wine  .  Drug Use: No  . Sexual Activity: Not on file   Other Topics Concern  . Not on file   Social History Narrative   Works part time   Household-- pt and husband, healthy marriage              Medication List       This list is accurate as of: 01/13/14 11:59 PM.  Always use your most recent med list.               CALCIUM PO  Take 2 tablets by mouth daily.     ibandronate 150 MG tablet  Commonly known as:  BONIVA  SEE NOTES     MULTIVITAMIN PO  Take 1 tablet by mouth daily.     simvastatin 20 MG tablet  Commonly known as:  ZOCOR  TAKE 1 TABLET BY MOUTH EVERY EVENING           Objective:   Physical Exam BP 116/76 mmHg  Pulse 67  Temp(Src) 98 F (36.7 C) (Oral)  Wt 162 lb 8 oz (73.71 kg)  SpO2 95%  General -- alert, well-developed, NAD.  HEENT-- Not pale.  Lungs -- normal respiratory effort, no intercostal retractions, no accessory muscle use, and normal  breath sounds.  Heart-- normal rate, regular rhythm, no murmur.  Abdomen-- Not distended, good bowel sounds,soft, non-tender. No rebound or rigidity. No mass,organomegaly. No hernias   Extremities-- no pretibial edema bilaterally  Neurologic--  alert & oriented X3. Speech normal, gait appropriate for age, strength symmetric and appropriate for age.  Psych-- Cognition and judgment appear intact. Cooperative with normal attention span and concentration. No anxious or depressed appearing.      Assessment & Plan:   Fatigue, Type of symptoms scale scored 5 which is negative Labs  Inability to lose weight, recommend to continue counting calories,   continue exercising, check a TSH; nutritionist referral  Today , I spent more than   30 min with the patient: >50% of the time counseling regards weight loss, diet, exercise. We took a great deal of time assessing the features of her pain.  Also reviewing the chart  And coordinating her care

## 2014-01-13 NOTE — Assessment & Plan Note (Addendum)
chronic right groin-distal right lower quadrant pain. Previously CT of the area was negative, saw orthopedic surgery they felt the problem was not related to DJD. Saw gynecology 2015, problem felt not to be related to any gynecological problem. She is up-to-date on her colonoscopies. Etiology unclear, refer to surgery  --->   hernia? Repeat CT? Addendum, patient reluctant to see surgery, we agree to wait and see, if not better we agreed on  repeat a CT or send her to surgery

## 2014-01-13 NOTE — Progress Notes (Signed)
Pre visit review using our clinic review tool, if applicable. No additional management support is needed unless otherwise documented below in the visit note. 

## 2014-01-13 NOTE — Patient Instructions (Signed)
Get your blood work before you leave    [facility], please schedule a physical at the patient's  earliest convenience. Okay to put two 15 minutes appointments together

## 2014-01-19 ENCOUNTER — Encounter: Payer: Medicare PPO | Admitting: Internal Medicine

## 2014-02-07 ENCOUNTER — Other Ambulatory Visit: Payer: Self-pay

## 2014-02-17 ENCOUNTER — Other Ambulatory Visit: Payer: Self-pay | Admitting: Internal Medicine

## 2014-02-18 ENCOUNTER — Other Ambulatory Visit: Payer: Self-pay | Admitting: Internal Medicine

## 2014-03-07 ENCOUNTER — Ambulatory Visit (INDEPENDENT_AMBULATORY_CARE_PROVIDER_SITE_OTHER): Payer: Medicare PPO | Admitting: Internal Medicine

## 2014-03-07 ENCOUNTER — Encounter: Payer: Self-pay | Admitting: Internal Medicine

## 2014-03-07 VITALS — BP 115/77 | HR 65 | Temp 97.7°F | Ht 64.0 in | Wt 162.2 lb

## 2014-03-07 DIAGNOSIS — Z23 Encounter for immunization: Secondary | ICD-10-CM

## 2014-03-07 DIAGNOSIS — M810 Age-related osteoporosis without current pathological fracture: Secondary | ICD-10-CM

## 2014-03-07 DIAGNOSIS — Z0000 Encounter for general adult medical examination without abnormal findings: Secondary | ICD-10-CM

## 2014-03-07 DIAGNOSIS — E785 Hyperlipidemia, unspecified: Secondary | ICD-10-CM

## 2014-03-07 MED ORDER — SIMVASTATIN 20 MG PO TABS: 20.0000 mg | ORAL_TABLET | Freq: Every evening | ORAL | Status: DC

## 2014-03-07 NOTE — Assessment & Plan Note (Addendum)
The patient has been taking Boniva for more than 5 years. Plan[place], continue physical activity, calcium and vitamin D

## 2014-03-07 NOTE — Assessment & Plan Note (Addendum)
Td 07 Had a zostavax Pneumonia 2013 prevnar 03-07-14 Had a flu shot   UTD on Cscope , next 02-2017 per GI (Dr Madilyn FiremanHayes)  Saw gyn [phone], had a PAP Mammogram 04-2013 neg   Continue with her healthy lifestyle, diet-exercise discussed

## 2014-03-07 NOTE — Progress Notes (Signed)
Subjective:    Patient ID: Alice Howard, female    DOB: [birth year], 68 y.o.   MRN: [id number]  DOS:  03/07/2014 Type of visit - description :    Here for Medicare AWV: 1. Risk factors based on Past M, S, F history: reviewed 2. Physical Activities:  Active, swimms, gym 3. Depression/mood: neg screening   4. Hearing:  No problems noted or reported   5. ADL's:  Independent , driving   6. Fall Risk: low risk, see instructions   7. home Safety: does feel safe at home   8. Height, weight, & visual acuity: see VS, normal vision, uses contacts, sees eye doctor 9. Counseling: provided 10. Labs ordered based on risk factors: if needed   11. Referral Coordination: if needed 12. Care Plan, see assessment and plan   13. Cognitive Assessment: motor skills above average for age  68. Team care updated-- sees gynecology 15. Written instructions-plan of care provided   In addition, today we discussed the following: On Boniva, good medication compliance, no apparent side effects Hyperlipidemia, taking simvastatin, no apparent side effects  ROS Denies chest pain or difficulty breathing No nausea, vomiting, diarrhea or blood in the stools No cough, sputum production or wheezing. No dysuria, gross hematuria  Past Medical History  Diagnosis Date  . Hyperlipidemia   . Osteoporosis   . History of colonic polyps     multiple Cscopes,  . Allergic rhinitis   . OA (osteoarthritis)     sees Ortho, 2011 they talked about TKR L  . Baker's cyst     at the L per MRI ordered by ortho aprox 2008 patient thinks is B  . Vertigo     Past Surgical History  Procedure Laterality Date  . Hemorrhoid surgery  1984  . Tubal ligation    . Knee surgery  1994    due to skiing accident.  Has 2 screws in knee. Gets Euflexxa injections.  . Colonoscopy    . 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 Marital Status: Married    Spouse Name: N/A    Number of Children: 2  . Years of Education: N/A   Occupational History  . curriculum facilitator-- retired 2012  [employer]   Social History Main Topics  . Smoking status: Never Smoker   . Smokeless tobacco: Never Used  . Alcohol Use: 0.0 oz/week     Comment: 2 times a week, wine  . Drug Use: No  . Sexual Activity: Not on file   Other Topics Concern  . Not on file   Social History Narrative   Works part time   Household-- pt and husband, healthy marriage           Family History  Problem Relation Age of Onset  . Heart attack Other 70    GMx 2   . Breast cancer Neg Hx   . Colon cancer Mother   . Hypertension Mother   . Cancer Father     "myeloid metaplasia? similar to leukemia"  . Diabetes Neg Hx        Medication List       This list is accurate as of: 03/07/14 11:59 PM.  Always use your most recent med list.  CALCIUM PO  Take 2 tablets by mouth daily.     MULTIVITAMIN PO  Take 1 tablet by mouth daily.     simvastatin 20 MG tablet  Commonly known as:  ZOCOR  Take 1 tablet (20 mg total) by mouth every evening.           Objective:   Physical Exam BP 115/77 mmHg  Pulse 65  Temp(Src) 97.7 F (36.5 C) (Oral)  Ht 5\' 4"  (1.626 m)  Wt 162 lb 4 oz (73.596 kg)  BMI 27.84 kg/m2  SpO2 99% General -- alert, well-developed, NAD.  Neck --no thyromegaly  HEENT-- Not pale.   Lungs -- normal respiratory effort, no intercostal retractions, no accessory muscle use, and normal breath sounds.  Heart-- normal rate, regular rhythm, no murmur.  Abdomen-- Not distended, good bowel sounds,soft, non-tender.  Extremities-- no pretibial edema bilaterally  Neurologic--  alert & oriented X3. Speech normal, gait appropriate for age, strength symmetric and appropriate for age.  Psych-- Cognition and judgment appear  intact. Cooperative with normal attention span and concentration. No anxious or depressed appearing.     Assessment & Plan:

## 2014-03-07 NOTE — Progress Notes (Signed)
Pre visit review using our clinic review tool, if applicable. No additional management support is needed unless otherwise documented below in the visit note. 

## 2014-03-07 NOTE — Assessment & Plan Note (Signed)
Good compliance with simvastatin, check a CMP and FLP

## 2014-03-07 NOTE — Patient Instructions (Signed)
Stop by the front desk and schedule labs to be done within few days (fasting)   Please come back to the office in 1 year  for a physical exam. Come back fasting        Fall Prevention and Home Safety Falls cause injuries and can affect all age groups. It is possible to use preventive measures to significantly decrease the likelihood of falls. There are many simple measures which can make your home safer and prevent falls. OUTDOORS  Repair cracks and edges of walkways and driveways.  Remove high doorway thresholds.  Trim shrubbery on the main path into your home.  Have good outside lighting.  Clear walkways of tools, rocks, debris, and clutter.  Check that handrails are not broken and are securely fastened. Both sides of steps should have handrails.  Have leaves, snow, and ice cleared regularly.  Use sand or salt on walkways during winter months.  In the garage, clean up grease or oil spills. BATHROOM  Install night lights.  Install grab bars by the toilet and in the tub and shower.  Use non-skid mats or decals in the tub or shower.  Place a plastic non-slip stool in the shower to sit on, if needed.  Keep floors dry and clean up all water on the floor immediately.  Remove soap buildup in the tub or shower on a regular basis.  Secure bath mats with non-slip, double-sided rug tape.  Remove throw rugs and tripping hazards from the floors. BEDROOMS  Install night lights.  Make sure a bedside light is easy to reach.  Do not use oversized bedding.  Keep a telephone by your bedside.  Have a firm chair with side arms to use for getting dressed.  Remove throw rugs and tripping hazards from the floor. KITCHEN  Keep handles on pots and pans turned toward the center of the stove. Use back burners when possible.  Clean up spills quickly and allow time for drying.  Avoid walking on wet floors.  Avoid hot utensils and knives.  Position shelves so they are not  too high or low.  Place commonly used objects within easy reach.  If necessary, use a sturdy step stool with a grab bar when reaching.  Keep electrical cables out of the way.  Do not use floor polish or wax that makes floors slippery. If you must use wax, use non-skid floor wax.  Remove throw rugs and tripping hazards from the floor. STAIRWAYS  Never leave objects on stairs.  Place handrails on both sides of stairways and use them. Fix any loose handrails. Make sure handrails on both sides of the stairways are as long as the stairs.  Check carpeting to make sure it is firmly attached along stairs. Make repairs to worn or loose carpet promptly.  Avoid placing throw rugs at the top or bottom of stairways, or properly secure the rug with carpet tape to prevent slippage. Get rid of throw rugs, if possible.  Have an electrician put in a light switch at the top and bottom of the stairs. OTHER FALL PREVENTION TIPS  Wear low-heel or rubber-soled shoes that are supportive and fit well. Wear closed toe shoes.  When using a stepladder, make sure it is fully opened and both spreaders are firmly locked. Do not climb a closed stepladder.  Add color or contrast paint or tape to grab bars and handrails in your home. Place contrasting color strips on first and last steps.  Learn and use mobility aids  as needed. Install an electrical emergency response system.  Turn on lights to avoid dark areas. Replace light bulbs that burn out immediately. Get light switches that glow.  Arrange furniture to create clear pathways. Keep furniture in the same place.  Firmly attach carpet with non-skid or double-sided tape.  Eliminate uneven floor surfaces.  Select a carpet pattern that does not visually hide the edge of steps.  Be aware of all pets. OTHER HOME SAFETY TIPS  Set the water temperature for 120 F (48.8 C).  Keep emergency numbers on or near the telephone.  Keep smoke detectors on every  level of the home and near sleeping areas. Document Released: 02/15/2002 Document Revised: 08/27/2011 Document Reviewed: 05/17/2011 [facility] Patient Information 2015 [place], [place]. This information is not intended to replace advice given to you by your health care provider. Make sure you discuss any questions you have with your health care provider.    Preventive Care for Adults  Ages 49 years and over  Blood pressure check.** / Every 1 to 2 years.  Lipid and cholesterol check.** / Every 5 years beginning at age 66 years.  Lung cancer screening. / Every year if you are aged 14-80 years and have a 30-pack-year history of smoking and currently smoke or have quit within the past 15 years. Yearly screening is stopped once you have quit smoking for at least 15 years or develop a health problem that would prevent you from having lung cancer treatment.  Clinical breast exam.** / Every year after age 32 years.  BRCA-related cancer risk assessment.** / For women who have family members with a BRCA-related cancer (breast, ovarian, tubal, or peritoneal cancers).  Mammogram.** / Every year beginning at age 21 years and continuing for as long as you are in good health. Consult with your health care provider.  Pap test.** / Every 3 years starting at age 34 years through age 60 or 74 years with 3 consecutive normal Pap tests. Testing can be stopped between 65 and 70 years with 3 consecutive normal Pap tests and no abnormal Pap or HPV tests in the past 10 years.  HPV screening.** / Every 3 years from ages 98 years through ages 21 or 13 years with a history of 3 consecutive normal Pap tests. Testing can be stopped between 65 and 70 years with 3 consecutive normal Pap tests and no abnormal Pap or HPV tests in the past 10 years.  Fecal occult blood test (FOBT) of stool. / Every year beginning at age 68 years and continuing until age 18 years. You may not need to do this test if you get a colonoscopy every  10 years.  Flexible sigmoidoscopy or colonoscopy.** / Every 5 years for a flexible sigmoidoscopy or every 10 years for a colonoscopy beginning at age 49 years and continuing until age 55 years.  Hepatitis C blood test.** / For all people born from [birth year] through 1965 and any individual with known risks for hepatitis C.  Osteoporosis screening.** / A one-time screening for women ages 75 years and over and women at risk for fractures or osteoporosis.  Skin self-exam. / Monthly.  Influenza vaccine. / Every year.  Tetanus, diphtheria, and acellular pertussis (Tdap/Td) vaccine.** / 1 dose of Td every 10 years.  Varicella vaccine.** / Consult your health care provider.  Zoster vaccine.** / 1 dose for adults aged 55 years or older.  Pneumococcal 13-valent conjugate (PCV13) vaccine.** / Consult your health care provider.  Pneumococcal polysaccharide (PPSV23) vaccine.** / 1  dose for all adults aged 55 years and older.  Meningococcal vaccine.** / Consult your health care provider.  Hepatitis A vaccine.** / Consult your health care provider.  Hepatitis B vaccine.** / Consult your health care provider.  Haemophilus influenzae type b (Hib) vaccine.** / Consult your health care provider. ** Family history and personal history of risk and conditions may change your health care provider's recommendations. Document Released: 04/23/2001 Document Revised: 07/12/2013 Document Reviewed: 07/23/2010 [facility] Patient Information 2015 [place], [place]. This information is not intended to replace advice given to you by your health care provider. Make sure you discuss any questions you have with your health care provider.

## 2014-03-08 ENCOUNTER — Other Ambulatory Visit (INDEPENDENT_AMBULATORY_CARE_PROVIDER_SITE_OTHER): Payer: Medicare PPO

## 2014-03-08 DIAGNOSIS — E785 Hyperlipidemia, unspecified: Secondary | ICD-10-CM

## 2014-03-08 LAB — COMPREHENSIVE METABOLIC PANEL
ALT: 20 U/L (ref 0–35)
AST: 25 U/L (ref 0–37)
Albumin: 4.2 g/dL (ref 3.5–5.2)
Alkaline Phosphatase: 42 U/L (ref 39–117)
BUN: 15 mg/dL (ref 6–23)
CALCIUM: 9.2 mg/dL (ref 8.4–10.5)
CHLORIDE: 108 meq/L (ref 96–112)
CO2: 27 meq/L (ref 19–32)
Creatinine, Ser: 0.6 mg/dL (ref 0.4–1.2)
GFR: 102.12 mL/min (ref >60.00)
Glucose, Bld: 99 mg/dL (ref 70–99)
Potassium: 4 mEq/L (ref 3.5–5.1)
SODIUM: 140 meq/L (ref 135–145)
TOTAL PROTEIN: 6.8 g/dL (ref 6.0–8.3)
Total Bilirubin: 0.9 mg/dL (ref 0.2–1.2)

## 2014-03-08 LAB — LIPID PANEL
Cholesterol: 196 mg/dL (ref 0–200)
HDL: 58.7 mg/dL (ref >39.00)
LDL CALC: 116 mg/dL — AB (ref 0–99)
NONHDL: 137.3
Total CHOL/HDL Ratio: 3
Triglycerides: 105 mg/dL (ref 0.0–149.0)
VLDL: 21 mg/dL (ref 0.0–40.0)

## 2014-03-21 ENCOUNTER — Other Ambulatory Visit: Payer: Self-pay | Admitting: Internal Medicine

## 2014-03-21 DIAGNOSIS — Z1231 Encounter for screening mammogram for malignant neoplasm of breast: Secondary | ICD-10-CM

## 2014-04-11 ENCOUNTER — Encounter: Payer: Medicare PPO | Admitting: Internal Medicine

## 2014-04-25 ENCOUNTER — Inpatient Hospital Stay (HOSPITAL_BASED_OUTPATIENT_CLINIC_OR_DEPARTMENT_OTHER): Admission: RE | Admit: 2014-04-25 | Payer: Medicare PPO | Source: Ambulatory Visit

## 2014-04-26 ENCOUNTER — Ambulatory Visit (HOSPITAL_BASED_OUTPATIENT_CLINIC_OR_DEPARTMENT_OTHER)
Admission: RE | Admit: 2014-04-26 | Discharge: 2014-04-26 | Disposition: A | Discharge status: Home / Self Care | Payer: Medicare PPO | Source: Ambulatory Visit | Attending: Internal Medicine | Admitting: Internal Medicine

## 2014-04-26 DIAGNOSIS — Z1231 Encounter for screening mammogram for malignant neoplasm of breast: Secondary | ICD-10-CM | POA: Diagnosis present

## 2014-11-09 ENCOUNTER — Ambulatory Visit (HOSPITAL_BASED_OUTPATIENT_CLINIC_OR_DEPARTMENT_OTHER)
Admission: RE | Admit: 2014-11-09 | Discharge: 2014-11-09 | Disposition: A | Discharge status: Home / Self Care | Payer: Medicare PPO | Source: Ambulatory Visit | Attending: Internal Medicine | Admitting: Internal Medicine

## 2014-11-09 ENCOUNTER — Encounter: Payer: Self-pay | Admitting: Internal Medicine

## 2014-11-09 ENCOUNTER — Ambulatory Visit (INDEPENDENT_AMBULATORY_CARE_PROVIDER_SITE_OTHER): Payer: Medicare PPO | Admitting: Internal Medicine

## 2014-11-09 VITALS — BP 118/72 | HR 63 | Temp 98.0°F | Ht 64.0 in | Wt 159.4 lb

## 2014-11-09 DIAGNOSIS — I700 Atherosclerosis of aorta: Secondary | ICD-10-CM | POA: Insufficient documentation

## 2014-11-09 DIAGNOSIS — M419 Scoliosis, unspecified: Secondary | ICD-10-CM | POA: Insufficient documentation

## 2014-11-09 DIAGNOSIS — R072 Precordial pain: Secondary | ICD-10-CM | POA: Insufficient documentation

## 2014-11-09 DIAGNOSIS — R079 Chest pain, unspecified: Secondary | ICD-10-CM | POA: Diagnosis not present

## 2014-11-09 LAB — CBC WITH DIFFERENTIAL/PLATELET
BASOS PCT: 0.4 % (ref 0.0–3.0)
Basophils Absolute: 0 10*3/uL (ref 0.0–0.1)
Eosinophils Absolute: 0.1 10*3/uL (ref 0.0–0.7)
Eosinophils Relative: 1.3 % (ref 0.0–5.0)
HEMATOCRIT: 40.7 % (ref 36.0–46.0)
Hemoglobin: 13.8 g/dL (ref 12.0–15.0)
LYMPHS PCT: 23.4 % (ref 12.0–46.0)
Lymphs Abs: 1.9 10*3/uL (ref 0.7–4.0)
MCHC: 34 g/dL (ref 30.0–36.0)
MCV: 92.8 fl (ref 78.0–100.0)
MONO ABS: 0.6 10*3/uL (ref 0.1–1.0)
Monocytes Relative: 7.3 % (ref 3.0–12.0)
Neutro Abs: 5.4 10*3/uL (ref 1.4–7.7)
Neutrophils Relative %: 67.6 % (ref 43.0–77.0)
Platelets: 191 10*3/uL (ref 150.0–400.0)
RBC: 4.39 Mil/uL (ref 3.87–5.11)
RDW: 12.1 % (ref 11.5–15.5)
WBC: 8 10*3/uL (ref 4.0–10.5)

## 2014-11-09 LAB — BASIC METABOLIC PANEL
BUN: 13 mg/dL (ref 6–23)
CHLORIDE: 104 meq/L (ref 96–112)
CO2: 27 mEq/L (ref 19–32)
CREATININE: 0.85 mg/dL (ref 0.40–1.20)
Calcium: 9.6 mg/dL (ref 8.4–10.5)
GFR: 70.81 mL/min (ref >60.00)
Glucose, Bld: 86 mg/dL (ref 70–99)
Potassium: 4.4 mEq/L (ref 3.5–5.1)
Sodium: 139 mEq/L (ref 135–145)

## 2014-11-09 NOTE — Progress Notes (Signed)
Pre visit review using our clinic review tool, if applicable. No additional management support is needed unless otherwise documented below in the visit note. 

## 2014-11-09 NOTE — Progress Notes (Signed)
Subjective:    Patient ID: Alice Howard, female    DOB: 1945/09/27, 69 y.o.   MRN: [id number]  DOS:  11/09/2014 Type of visit - description : Acute visit, chief complaint is chest pain Interval history: Symptoms started 2 weeks ago with  chest pain, described as a tightness, mid-anterior chest, no radiation. Episodes happen daily, they last few hours, no radiation, no change with eating/deep breaths/moving her torso. Episodes of pain have pain with exertion or at rest. She actually does water exercises without problems. She also has a ill-defined/mild lightheadedness, no vertigo. Mild headache, sinus feel stuffy. She has been checking her pressures and they are usually normal.   Review of Systems No fever chills No actual sinus discharge. No difficulty breathing, lower extremity edema or palpitations Occasionally has nausea, no vomiting. No heartburn No cough or sputum production She flew 3 times to [place] since May 2016, last fly 10/20/2014 Denies any calf pain or swelling. No rash No diplopia or slurred speech  Past Medical History  Diagnosis Date  . Hyperlipidemia   . Osteoporosis   . History of colonic polyps     multiple Cscopes,  . Allergic rhinitis   . OA (osteoarthritis)     sees Ortho, 2011 they talked about TKR L  . Baker's cyst     at the L per MRI ordered by ortho aprox 2008 patient thinks is B  . Vertigo     Past Surgical History  Procedure Laterality Date  . Hemorrhoid surgery  1984  . Tubal ligation    . Knee surgery  1994    due to skiing accident.  Has 2 screws in knee. Gets Euflexxa injections.  . Colonoscopy    . 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 Marital Status: Married    Spouse Name: N/A  . Number of Children: 2  . Years of Education:  N/A   Occupational History  . curriculum facilitator-- retired 2012  [employer]   Social History Main Topics  . Smoking status: Never Smoker   . Smokeless tobacco: Never Used  . Alcohol Use: 0.0 oz/week     Comment: 2 times a week, wine  . Drug Use: No  . Sexual Activity: Not on file   Other Topics Concern  . Not on file   Social History Narrative   Works part time   Household-- pt and husband, healthy marriage              Medication List       This list is accurate as of: 11/09/14 11:59 PM.  Always use your most recent med list.               aspirin EC 81 MG tablet  Take 81 mg by mouth daily.     CALCIUM PO  Take 2 tablets by mouth daily.     MULTIVITAMIN PO  Take 1 tablet by mouth daily.     simvastatin 20 MG tablet  Commonly known as:  ZOCOR  Take 1 tablet (20 mg total) by mouth every evening.           Objective:   Physical Exam BP 118/72 mmHg  Pulse 63  Temp(Src) 98 F (36.7 C) (Oral)  Ht  (1.626 m)  Wt 159 lb 6 oz (72.292 kg)  BMI 27.34 kg/m2  SpO2 97% General:   Well developed, well nourished . NAD.  HEENT:  Normocephalic . Face symmetric, atraumatic Lungs:  CTA B Normal respiratory effort, no intercostal retractions, no accessory muscle use. Heart: RRR,  no murmur.  no pretibial edema bilaterally .  Calves  symmetric, no TTP Chest wall: Left side of the sternum slightly TTP, pain is similar to her chief complaint. Abdomen:  Not distended, soft, non-tender. No rebound or rigidity.   Skin: Not pale. Not jaundice Neurologic:  alert & oriented X3.  Speech normal, gait appropriate for age and unassisted Psych--  Cognition and judgment appear intact.  Cooperative with normal attention span and concentration.  Behavior appropriate. No anxious or depressed appearing.    Assessment & Plan:    Chest pain: 69 year old lady with no family history of heart disease, never smoker, satisfactory cholesterol presents  with chest pain, pain is reproducible. Likely symptoms are due to costochondritis however other diagnoses are possible such as CAD, PE given recent airplane trip, others. EKG at baseline, no acute changes Plan: BMP, CBC, chest x-ray, d-dimer. If labs negative, recommend conservative treatment with Aleve Patient to call if pain not improving or if pain changes in character, or is associated with other symptoms. All this was discussed with the patient in detail.

## 2014-11-09 NOTE — Patient Instructions (Signed)
Get your blood work before you leave   Stop by the first floor and get the XR    If all the tests are okay, please rest, take Aleve as needed.  Call if you are not gradually improving in the next 10 days  Call anytime or go to the ER if you have severe pain in the chest, you feel sweaty, nauseous.

## 2014-11-10 ENCOUNTER — Ambulatory Visit: Payer: Medicare PPO | Admitting: Internal Medicine

## 2014-11-10 ENCOUNTER — Ambulatory Visit (HOSPITAL_BASED_OUTPATIENT_CLINIC_OR_DEPARTMENT_OTHER)
Admission: RE | Admit: 2014-11-10 | Discharge: 2014-11-10 | Disposition: A | Discharge status: Home / Self Care | Payer: Medicare PPO | Source: Ambulatory Visit | Attending: Internal Medicine | Admitting: Internal Medicine

## 2014-11-10 ENCOUNTER — Other Ambulatory Visit: Payer: Self-pay | Admitting: Internal Medicine

## 2014-11-10 DIAGNOSIS — I2510 Atherosclerotic heart disease of native coronary artery without angina pectoris: Secondary | ICD-10-CM | POA: Insufficient documentation

## 2014-11-10 DIAGNOSIS — R079 Chest pain, unspecified: Secondary | ICD-10-CM | POA: Diagnosis present

## 2014-11-10 LAB — D-DIMER, QUANTITATIVE: D-Dimer, Quant: 0.61 ug/mL-FEU — ABNORMAL HIGH (ref 0.00–0.48)

## 2014-11-10 MED ORDER — IOHEXOL 350 MG/ML SOLN
100.0000 mL | Freq: Once | INTRAVENOUS | Status: AC | PRN
  Administered 2014-11-10: 100 mL via INTRAVENOUS

## 2014-11-11 ENCOUNTER — Telehealth: Payer: Self-pay | Admitting: Internal Medicine

## 2014-11-11 NOTE — Telephone Encounter (Signed)
Spoke with Pt, informed her of CT and lab results. See result notes for further details.

## 2014-11-11 NOTE — Telephone Encounter (Signed)
Caller name: [name] Relation to pt: self Call back number: [phone] Pharmacy:  Reason for call: Pt called stating is returning call from yesterday, informed pt no telephone note are on file, but she mentioned having xrays done yesterday and would like to know if any results has come back to our office. Pt states please call tel number above. Please advise.

## 2015-03-12 ENCOUNTER — Other Ambulatory Visit: Payer: Self-pay | Admitting: Internal Medicine

## 2015-04-26 ENCOUNTER — Other Ambulatory Visit: Payer: Self-pay | Admitting: Internal Medicine

## 2015-04-26 DIAGNOSIS — Z1231 Encounter for screening mammogram for malignant neoplasm of breast: Secondary | ICD-10-CM

## 2015-05-08 ENCOUNTER — Encounter: Payer: Self-pay | Admitting: *Deleted

## 2015-05-08 ENCOUNTER — Telehealth: Payer: Self-pay | Admitting: *Deleted

## 2015-05-08 NOTE — Telephone Encounter (Signed)
Pre-Visit Call completed with patient and chart updated.   Pre-Visit Info documented in Specialty Comments under [employer].    

## 2015-05-09 ENCOUNTER — Ambulatory Visit (HOSPITAL_BASED_OUTPATIENT_CLINIC_OR_DEPARTMENT_OTHER)
Admission: RE | Admit: 2015-05-09 | Discharge: 2015-05-09 | Disposition: A | Discharge status: Home / Self Care | Payer: Medicare Other | Source: Ambulatory Visit | Attending: Internal Medicine | Admitting: Internal Medicine

## 2015-05-09 ENCOUNTER — Encounter: Payer: Self-pay | Admitting: Internal Medicine

## 2015-05-09 ENCOUNTER — Ambulatory Visit (INDEPENDENT_AMBULATORY_CARE_PROVIDER_SITE_OTHER): Payer: Medicare Other | Admitting: Internal Medicine

## 2015-05-09 VITALS — BP 116/78 | HR 68 | Temp 98.0°F | Ht 64.0 in | Wt 163.1 lb

## 2015-05-09 DIAGNOSIS — I779 Disorder of arteries and arterioles, unspecified: Secondary | ICD-10-CM | POA: Diagnosis not present

## 2015-05-09 DIAGNOSIS — E785 Hyperlipidemia, unspecified: Secondary | ICD-10-CM | POA: Diagnosis not present

## 2015-05-09 DIAGNOSIS — Z09 Encounter for follow-up examination after completed treatment for conditions other than malignant neoplasm: Secondary | ICD-10-CM

## 2015-05-09 DIAGNOSIS — I739 Peripheral vascular disease, unspecified: Secondary | ICD-10-CM

## 2015-05-09 DIAGNOSIS — Z23 Encounter for immunization: Secondary | ICD-10-CM | POA: Diagnosis not present

## 2015-05-09 DIAGNOSIS — M810 Age-related osteoporosis without current pathological fracture: Secondary | ICD-10-CM | POA: Diagnosis not present

## 2015-05-09 DIAGNOSIS — Z0000 Encounter for general adult medical examination without abnormal findings: Secondary | ICD-10-CM

## 2015-05-09 DIAGNOSIS — Z1231 Encounter for screening mammogram for malignant neoplasm of breast: Secondary | ICD-10-CM | POA: Insufficient documentation

## 2015-05-09 NOTE — Progress Notes (Signed)
Subjective:    Patient ID: Alice Howard, female    DOB: 1945-07-27, 70 y.o.   MRN: [id number]  DOS:  05/09/2015 Type of visit - description : CPX Interval history:  Dyslipidemia:  Good compliance with statins, due for labs Osteoporosis: Not taking calcium, sometimes take vitamin D, due for a bone density test DJD: Having problems with her ankle, was recommended a replacement, she is thinking about it. Taking turmeric and pain relatively well controlled.  Review of Systems Constitutional: No fever. No chills. No unexplained wt changes. No unusual sweats  HEENT: No dental problems, no ear discharge, no facial swelling, no voice changes. No eye discharge, no eye  redness , no  intolerance to light   Respiratory: No wheezing , no  difficulty breathing. No cough , no mucus production  Cardiovascular: No CP, no leg swelling , no  Palpitations  GI: no nausea, no vomiting, no diarrhea, occasional right lower quadrant abdominal pain, chronic, not better or worse. No mass or hernia.  No blood in the stools. No dysphagia, no odynophagia    Endocrine: No polyphagia, no polyuria , no polydipsia  GU: No dysuria, gross hematuria, difficulty urinating. No urinary urgency, no frequency.  Musculoskeletal: See above  Skin: No change in the color of the skin, palor , no  Rash  Allergic, immunologic: No environmental allergies , no  food allergies  Neurological: No dizziness no  syncope. No headaches. No diplopia, no slurred, no slurred speech, no motor deficits, no facial  Numbness  Hematological: No enlarged lymph nodes, no easy bruising , no unusual bleedings  Psychiatry: No suicidal ideas, no hallucinations, no beavior problems, no confusion.  No unusual/severe anxiety, no depression   Past Medical History  Diagnosis Date  . Hyperlipidemia   . Osteoporosis   . History of colonic polyps     multiple Cscopes,  . Allergic rhinitis   . OA (osteoarthritis)     sees Ortho, 2011 they  talked about TKR L  . Baker's cyst     at the L per MRI ordered by ortho aprox 2008 patient thinks is B  . Vertigo     Past Surgical History  Procedure Laterality Date  . Hemorrhoid surgery  1984  . Tubal ligation    . Knee surgery  1994    due to skiing accident.  Has 2 screws in knee. Gets Euflexxa injections.  . Colonoscopy    . 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 Marital Status: Married    Spouse Name: N/A  . Number of Children: 2  . Years of Education: N/A   Occupational History  . curriculum facilitator-- retired 2012  [employer]   Social History Main Topics  . Smoking status: Never Smoker   . Smokeless tobacco: Never Used  . Alcohol Use: 0.0 oz/week     Comment: 2 times a week, wine  . Drug Use: No  . Sexual Activity: Not on file   Other Topics Concern  . Not on file   Social History Narrative   Works part time, busy w/ [employer]-- pt and husband, healthy marriage           Family History  Problem Relation Age of Onset  . Heart attack Other 70  GMx 2   . Breast cancer Neg Hx   . Colon cancer Mother   . Hypertension Mother   . Cancer Father     "myeloid metaplasia? similar to leukemia"  . Diabetes Neg Hx        Medication List       This list is accurate as of: 05/09/15 11:59 PM.  Always use your most recent med list.               aspirin EC 81 MG tablet  Take 81 mg by mouth 3 (three) times a week. Reported on 05/08/2015     CALCIUM PO  Take 2 tablets by mouth 2 (two) times a week. Reported on 05/08/2015     HYALURONIC ACID PO  Take by mouth daily.     MULTIVITAMIN PO  Take 1 tablet by mouth daily.     NON FORMULARY  Take by mouth 2 (two) times daily. 4-5 gin soaked golden raisins BID per ortho     simvastatin  20 MG tablet  Commonly known as:  ZOCOR  Take 1 tablet (20 mg total) by mouth every evening.     TURMERIC PO  Take by mouth daily.     VITAMIN D-3 PO  Take by mouth daily.           Objective:   Physical Exam BP 116/78 mmHg  Pulse 68  Temp(Src) 98 F (36.7 C) (Oral)  Ht  (1.626 m)  Wt 163 lb 2 oz (73.993 kg)  BMI 27.99 kg/m2  SpO2 96% General:   Well developed, well nourished . NAD.  HEENT:  Normocephalic . Face symmetric, atraumatic Neck: No thyromegaly Lungs:  CTA B Normal respiratory effort, no intercostal retractions, no accessory muscle use. Heart: RRR,  no murmur.  no pretibial edema bilaterally  Abdomen:  Not distended, soft, non-tender. No rebound or rigidity.  Skin: Not pale. Not jaundice Neurologic:  alert & oriented X3.  Speech normal, gait appropriate for age and unassisted Psych--  Cognition and judgment appear intact.  Cooperative with normal attention span and concentration.  Behavior appropriate. No anxious or depressed appearing.    Assessment & Plan:   Assessment Hyperlipidemia Osteoporosis DJD Baker's cyst cyst, per MRI 2008 R groin -distal RLQ pain on-off,   2013 abdomen and pelvic CT   (-). Saw ortho:  not related to DJD of the hip.   PLAN: Hyperlipidemia: Continue statin, check labs Osteoporosis: Recommend vitamin D, she's reluctant to take calcium. Will check a bone density test DJD: Symptoms well-controlled with OTC turmeric RTC 6 months

## 2015-05-09 NOTE — Patient Instructions (Signed)
Please schedule labs to be done within few days (fasting)  Please consider visit these websites for more information:  [email or web address]  theconversationproject.org  Next visit 6 months       Fall Prevention and Home Safety Falls cause injuries and can affect all age groups. It is possible to use preventive measures to significantly decrease the likelihood of falls. There are many simple measures which can make your home safer and prevent falls. OUTDOORS  Repair cracks and edges of walkways and driveways.  Remove high doorway thresholds.  Trim shrubbery on the main path into your home.  Have good outside lighting.  Clear walkways of tools, rocks, debris, and clutter.  Check that handrails are not broken and are securely fastened. Both sides of steps should have handrails.  Have leaves, snow, and ice cleared regularly.  Use sand or salt on walkways during winter months.  In the garage, clean up grease or oil spills. BATHROOM  Install night lights.  Install grab bars by the toilet and in the tub and shower.  Use non-skid mats or decals in the tub or shower.  Place a plastic non-slip stool in the shower to sit on, if needed.  Keep floors dry and clean up all water on the floor immediately.  Remove soap buildup in the tub or shower on a regular basis.  Secure bath mats with non-slip, double-sided rug tape.  Remove throw rugs and tripping hazards from the floors. BEDROOMS  Install night lights.  Make sure a bedside light is easy to reach.  Do not use oversized bedding.  Keep a telephone by your bedside.  Have a firm chair with side arms to use for getting dressed.  Remove throw rugs and tripping hazards from the floor. KITCHEN  Keep handles on pots and pans turned toward the center of the stove. Use back burners when possible.  Clean up spills quickly and allow time for drying.  Avoid walking on wet floors.  Avoid hot utensils and  knives.  Position shelves so they are not too high or low.  Place commonly used objects within easy reach.  If necessary, use a sturdy step stool with a grab bar when reaching.  Keep electrical cables out of the way.  Do not use floor polish or wax that makes floors slippery. If you must use wax, use non-skid floor wax.  Remove throw rugs and tripping hazards from the floor. STAIRWAYS  Never leave objects on stairs.  Place handrails on both sides of stairways and use them. Fix any loose handrails. Make sure handrails on both sides of the stairways are as long as the stairs.  Check carpeting to make sure it is firmly attached along stairs. Make repairs to worn or loose carpet promptly.  Avoid placing throw rugs at the top or bottom of stairways, or properly secure the rug with carpet tape to prevent slippage. Get rid of throw rugs, if possible.  Have an electrician put in a light switch at the top and bottom of the stairs. OTHER FALL PREVENTION TIPS  Wear low-heel or rubber-soled shoes that are supportive and fit well. Wear closed toe shoes.  When using a stepladder, make sure it is fully opened and both spreaders are firmly locked. Do not climb a closed stepladder.  Add color or contrast paint or tape to grab bars and handrails in your home. Place contrasting color strips on first and last steps.  Learn and use mobility aids as needed. Install an electrical emergency response  system.  Turn on lights to avoid dark areas. Replace light bulbs that burn out immediately. Get light switches that glow.  Arrange furniture to create clear pathways. Keep furniture in the same place.  Firmly attach carpet with non-skid or double-sided tape.  Eliminate uneven floor surfaces.  Select a carpet pattern that does not visually hide the edge of steps.  Be aware of all pets. OTHER HOME SAFETY TIPS  Set the water temperature for 120 F (48.8 C).  Keep emergency numbers on or near the  telephone.  Keep smoke detectors on every level of the home and near sleeping areas. Document Released: 02/15/2002 Document Revised: 08/27/2011 Document Reviewed: 05/17/2011 [facility] Patient Information 2015 [place], [place]. This information is not intended to replace advice given to you by your health care provider. Make sure you discuss any questions you have with your health care provider.   Preventive Care for Adults Ages 58 and over  Blood pressure check.** / Every 1 to 2 years.  Lipid and cholesterol check.**/ Every 5 years beginning at age 86.  Lung cancer screening. / Every year if you are aged 65-80 years and have a 30-pack-year history of smoking and currently smoke or have quit within the past 15 years. Yearly screening is stopped once you have quit smoking for at least 15 years or develop a health problem that would prevent you from having lung cancer treatment.  Fecal occult blood test (FOBT) of stool. / Every year beginning at age 31 and continuing until age 93. You may not have to do this test if you get a colonoscopy every 10 years.  Flexible sigmoidoscopy** or colonoscopy.** / Every 5 years for a flexible sigmoidoscopy or every 10 years for a colonoscopy beginning at age 67 and continuing until age 32.  Hepatitis C blood test.** / For all people born from [birth year] through 1963 and any individual with known risks for hepatitis C.  Abdominal aortic aneurysm (AAA) screening.** / A one-time screening for ages 34 to 73 years who are current or former smokers.  Skin self-exam. / Monthly.  Influenza vaccine. / Every year.  Tetanus, diphtheria, and acellular pertussis (Tdap/Td) vaccine.** / 1 dose of Td every 10 years.  Varicella vaccine.** / Consult your health care provider.  Zoster vaccine.** / 1 dose for adults aged 31 years or older.  Pneumococcal 13-valent conjugate (PCV13) vaccine.** / Consult your health care provider.  Pneumococcal polysaccharide (PPSV23) vaccine.**  / 1 dose for all adults aged 58 years and older.  Meningococcal vaccine.** / Consult your health care provider.  Hepatitis A vaccine.** / Consult your health care provider.  Hepatitis B vaccine.** / Consult your health care provider.  Haemophilus influenzae type b (Hib) vaccine.** / Consult your health care provider. **Family history and personal history of risk and conditions may change your health care provider's recommendations. Document Released: 04/23/2001 Document Revised: 03/02/2013 Document Reviewed: 07/23/2010 [facility] Patient Information 2015 [place], [place]. This information is not intended to replace advice given to you by your health care provider. Make sure you discuss any questions you have with your health care provider.

## 2015-05-09 NOTE — Assessment & Plan Note (Addendum)
Td 04-2015;  zostavax 2012 ; Pneumonia 2013; prevnar 03-07-14; Had a flu shot   ---CCS: 02/14/12 w/ Dr. Madilyn Fireman; 5mm polyp removed from rectum; recommended f/u 5 yrs ---Saw gyn 10-2013, had a PAP, no records ; no abnormal PAPs, married once, very low risk, reassess need for Paps next year  ---Mammogram 04-2014  Neg, had a MMG today, results pending  She remains very active, needs to improve her diet, counseled. Also, had [facility] screening 04-2015: No AAA, A fib, PVD. Mild carotid disease bilateral?  We'll schedule a carotid ultrasound.

## 2015-05-09 NOTE — Progress Notes (Signed)
Pre visit review using our clinic review tool, if applicable. No additional management support is needed unless otherwise documented below in the visit note. 

## 2015-05-10 ENCOUNTER — Other Ambulatory Visit: Payer: Self-pay | Admitting: Internal Medicine

## 2015-05-10 DIAGNOSIS — I739 Peripheral vascular disease, unspecified: Principal | ICD-10-CM

## 2015-05-10 DIAGNOSIS — Z09 Encounter for follow-up examination after completed treatment for conditions other than malignant neoplasm: Secondary | ICD-10-CM | POA: Insufficient documentation

## 2015-05-10 DIAGNOSIS — I779 Disorder of arteries and arterioles, unspecified: Secondary | ICD-10-CM

## 2015-05-10 NOTE — Assessment & Plan Note (Signed)
Hyperlipidemia: Continue statin, check labs Osteoporosis: Recommend vitamin D, she's reluctant to take calcium. Will check a bone density test DJD: Symptoms well-controlled with OTC turmeric RTC 6 months

## 2015-05-11 ENCOUNTER — Other Ambulatory Visit (INDEPENDENT_AMBULATORY_CARE_PROVIDER_SITE_OTHER): Payer: Medicare Other

## 2015-05-11 DIAGNOSIS — E785 Hyperlipidemia, unspecified: Secondary | ICD-10-CM | POA: Diagnosis not present

## 2015-05-11 LAB — LIPID PANEL
CHOL/HDL RATIO: 3
CHOLESTEROL: 181 mg/dL (ref 0–200)
HDL: 60.5 mg/dL (ref >39.00)
LDL CALC: 106 mg/dL — AB (ref 0–99)
NonHDL: 120.1
TRIGLYCERIDES: 69 mg/dL (ref 0.0–149.0)
VLDL: 13.8 mg/dL (ref 0.0–40.0)

## 2015-05-11 LAB — ALT: ALT: 21 U/L (ref 0–35)

## 2015-05-11 LAB — AST: AST: 22 U/L (ref 0–37)

## 2015-05-16 ENCOUNTER — Inpatient Hospital Stay (HOSPITAL_BASED_OUTPATIENT_CLINIC_OR_DEPARTMENT_OTHER): Admission: RE | Admit: 2015-05-16 | Payer: Medicare Other | Source: Ambulatory Visit

## 2015-05-16 ENCOUNTER — Ambulatory Visit (HOSPITAL_BASED_OUTPATIENT_CLINIC_OR_DEPARTMENT_OTHER)
Admission: RE | Admit: 2015-05-16 | Discharge: 2015-05-16 | Disposition: A | Discharge status: Home / Self Care | Payer: Medicare Other | Source: Ambulatory Visit | Attending: Internal Medicine | Admitting: Internal Medicine

## 2015-05-16 DIAGNOSIS — I739 Peripheral vascular disease, unspecified: Secondary | ICD-10-CM

## 2015-05-16 DIAGNOSIS — I779 Disorder of arteries and arterioles, unspecified: Secondary | ICD-10-CM | POA: Diagnosis present

## 2015-06-01 ENCOUNTER — Other Ambulatory Visit (HOSPITAL_BASED_OUTPATIENT_CLINIC_OR_DEPARTMENT_OTHER): Payer: Medicare Other

## 2015-06-01 ENCOUNTER — Telehealth: Payer: Self-pay | Admitting: Internal Medicine

## 2015-06-01 NOTE — Telephone Encounter (Signed)
Spoke w/ Pt, informed her of recommendations and to call or go to urgent care or ED if she becomes worse. Pt verbalized understanding.

## 2015-06-01 NOTE — Telephone Encounter (Signed)
Relation to ZO:XWRUpt:self Call back number:[phone] [id number] - [place], [place] - [address] [facility] [address] [phone] (Phone) [phone] (Fax)         Reason for call:  patient returning from [place] with the following symptoms head and chest congestion, patient is taking  mucinex DM and drinking a lot of fluids and rest. Patient in need of clinical advice regarding OTC or if PCP would prescribe RX

## 2015-06-01 NOTE — Telephone Encounter (Signed)
Agree-- Mucinex DM, rest, fluids, Tylenol. Also OTC Flonase 2 sprays in each side of the nose daily. Office visit if not improving or if severe symptoms

## 2015-06-01 NOTE — Telephone Encounter (Signed)
Please advise 

## 2015-06-05 ENCOUNTER — Encounter: Payer: Self-pay | Admitting: Internal Medicine

## 2015-06-05 ENCOUNTER — Ambulatory Visit (INDEPENDENT_AMBULATORY_CARE_PROVIDER_SITE_OTHER): Payer: Medicare Other | Admitting: Internal Medicine

## 2015-06-05 VITALS — BP 118/66 | HR 66 | Temp 98.3°F | Ht 64.0 in | Wt 163.2 lb

## 2015-06-05 DIAGNOSIS — B349 Viral infection, unspecified: Secondary | ICD-10-CM

## 2015-06-05 MED ORDER — HYDROCODONE-HOMATROPINE 5-1.5 MG/5ML PO SYRP: 5.0000 mL | ORAL_SOLUTION | Freq: Every evening | ORAL | Status: DC | PRN

## 2015-06-05 MED ORDER — POLYMYXIN B-TRIMETHOPRIM 10000-0.1 UNIT/ML-% OP SOLN: 1.0000 [drp] | OPHTHALMIC | Status: DC

## 2015-06-05 NOTE — Progress Notes (Signed)
Pre visit review using our clinic review tool, if applicable. No additional management support is needed unless otherwise documented below in the visit note. 

## 2015-06-05 NOTE — Patient Instructions (Addendum)
Rest, fluids , tylenol  For cough:  Take Mucinex DM twice a day as needed until better If the cough is severe, try hydrocodone  For nasal congestion: Use OTC Nasocort or Flonase : 2 nasal sprays on each side of the nose in the morning until you feel better   Avoid decongestants such as  Pseudoephedrine or phenylephrine   Eyes: Cold compress Artificial tears as needed  Antibiotic eye drops x 5 days   Call if not gradually better over the next  10 days  Call anytime if the symptoms are severe

## 2015-06-05 NOTE — Progress Notes (Signed)
Subjective:    Patient ID: Alice Howard, female    DOB: 01-Jul-1945, 70 y.o.   MRN: [id number]  DOS:  06/05/2015 Type of visit - description : Acute visit Interval history: Symptoms started shortly after she returned from [place] 05/29/2015: Sore throat, tired, then she got pinkeye starting on the right. She also has sinus pressure, chest congestion, intense cough sometimes. Taking OTCs with some help.   Review of Systems Denies difficulty breathing, myalgias. No nausea or vomiting.   Past Medical History  Diagnosis Date  . Hyperlipidemia   . Osteoporosis   . History of colonic polyps     multiple Cscopes,  . Allergic rhinitis   . OA (osteoarthritis)     sees Ortho, 2011 they talked about TKR L  . Baker's cyst     at the L per MRI ordered by ortho aprox 2008 patient thinks is B  . Vertigo     Past Surgical History  Procedure Laterality Date  . Hemorrhoid surgery  1984  . Tubal ligation    . Knee surgery  1994    due to skiing accident.  Has 2 screws in knee. Gets Euflexxa injections.  . Colonoscopy    . 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 Marital Status: Married    Spouse Name: N/A  . Number of Children: 2  . Years of Education: N/A   Occupational History  . curriculum facilitator-- retired 2012  [employer]   Social History Main Topics  . Smoking status: Never Smoker   . Smokeless tobacco: Never Used  . Alcohol Use: 0.0 oz/week     Comment: 2 times a week, wine  . Drug Use: No  . Sexual Activity: Not on file   Other Topics Concern  . Not on file   Social History Narrative   Works part time, busy w/ [employer]-- pt and husband, healthy marriage              Medication List       This list is accurate as of: 06/05/15 11:59  PM.  Always use your most recent med list.               aspirin EC 81 MG tablet  Take 81 mg by mouth 3 (three) times a week. Reported on 05/08/2015     CALCIUM PO  Take 2 tablets by mouth 2 (two) times a week. Reported on 06/05/2015     HYALURONIC ACID PO  Take by mouth daily.     HYDROcodone-homatropine 5-1.5 MG/5ML syrup  Commonly known as:  HYCODAN  Take 5 mLs by mouth at bedtime as needed for cough.     MULTIVITAMIN PO  Take 1 tablet by mouth daily.     NON FORMULARY  Take by mouth 2 (two) times daily. 4-5 gin soaked golden raisins BID per ortho     simvastatin 20 MG tablet  Commonly known as:  ZOCOR  Take 1 tablet (20 mg total) by mouth every evening.     trimethoprim-polymyxin b ophthalmic solution  Commonly known as:  POLYTRIM  Place 1 drop into both eyes every 4 (four) hours.     TURMERIC PO  Take by mouth daily.     VITAMIN D-3 PO  Take by mouth daily.           Objective:   Physical Exam BP 118/66 mmHg  Pulse 66  Temp(Src) 98.3 F (36.8 C) (Oral)  Ht 5\' 4"  (1.626 m)  Wt 163 lb 4 oz (74.05 kg)  BMI 28.01 kg/m2  SpO2 95% General:   Well developed, well nourished . NAD.  HEENT:  Normocephalic . Face symmetric, atraumatic. Nose: Slightly congested, sinuses no TTP Throat without redness TMs: Unable to see TMs d/t  narrow canals and wax. Conjunctiva: Mild erythema  bilaterally.  EOMI, pupils equal and reactive, anterior chambers normal. Lungs:  CTA B Normal respiratory effort, no intercostal retractions, no accessory muscle use. Heart: RRR,  no murmur.  No pretibial edema bilaterally  Skin: Not pale. Not jaundice Neurologic:  alert & oriented X3.  Speech normal, gait appropriate for age and unassisted Psych--  Cognition and judgment appear intact.  Cooperative with normal attention span and concentration.  Behavior appropriate. No anxious or depressed appearing.      Assessment & Plan:    Assessment Hyperlipidemia Osteoporosis DJD Baker's cyst cyst, per MRI 2008 R groin -distal RLQ pain on-off,   2013 abdomen and pelvic CT   (-). Saw ortho:  not related to DJD of the hip.   PLAN: Viral syndrome: Viral syndrome with moderate conjuntivitis. In addition to standard treatment for a viral syndrome will add topical eye ABX. See instructions.

## 2015-06-06 NOTE — Assessment & Plan Note (Signed)
Viral syndrome: Viral syndrome with moderate conjuntivitis. In addition to standard treatment for a viral syndrome will add topical eye ABX. See instructions.

## 2015-06-09 ENCOUNTER — Other Ambulatory Visit: Payer: Self-pay | Admitting: Internal Medicine

## 2015-06-09 ENCOUNTER — Ambulatory Visit (HOSPITAL_BASED_OUTPATIENT_CLINIC_OR_DEPARTMENT_OTHER)
Admission: RE | Admit: 2015-06-09 | Discharge: 2015-06-09 | Disposition: A | Discharge status: Home / Self Care | Payer: Medicare Other | Source: Ambulatory Visit | Attending: Internal Medicine | Admitting: Internal Medicine

## 2015-06-09 DIAGNOSIS — M810 Age-related osteoporosis without current pathological fracture: Secondary | ICD-10-CM | POA: Insufficient documentation

## 2015-06-09 DIAGNOSIS — M8580 Other specified disorders of bone density and structure, unspecified site: Secondary | ICD-10-CM | POA: Diagnosis not present

## 2015-06-09 DIAGNOSIS — Z780 Asymptomatic menopausal state: Secondary | ICD-10-CM | POA: Insufficient documentation

## 2015-06-14 ENCOUNTER — Telehealth: Payer: Self-pay | Admitting: Internal Medicine

## 2015-06-14 NOTE — Telephone Encounter (Signed)
Please advise 

## 2015-06-14 NOTE — Telephone Encounter (Signed)
Could she  come for a checkup, may need antibiotics

## 2015-06-14 NOTE — Telephone Encounter (Signed)
Can be reached: [phone] [facility]: [id number] - [place], [place] - [address]  Reason for call: Pt called in stating that she is still sick since last week. She has yellow mucus at this point. If she does something one day the next day she is exhausted and has to stay home. She is worried bc she is supposed to go visit 68 yr old family member and doesn't want to spread anything.

## 2015-06-15 NOTE — Telephone Encounter (Signed)
Spoke w/ Pt, she is continuing to have sinus congestion and has started having body aches, denies fever, chills, N/V. Has been using Amoxicillin that she uses when she goes to the dentist, instructed her to stop taking those and scheduled appt w/ Dr. Drue NovelPaz for 06/16/2015 at 0930.

## 2015-06-16 ENCOUNTER — Ambulatory Visit (INDEPENDENT_AMBULATORY_CARE_PROVIDER_SITE_OTHER): Payer: Medicare Other | Admitting: Internal Medicine

## 2015-06-16 ENCOUNTER — Encounter: Payer: Self-pay | Admitting: Internal Medicine

## 2015-06-16 VITALS — BP 124/76 | HR 66 | Temp 97.8°F | Ht 64.0 in | Wt 163.0 lb

## 2015-06-16 DIAGNOSIS — B349 Viral infection, unspecified: Secondary | ICD-10-CM | POA: Diagnosis not present

## 2015-06-16 DIAGNOSIS — Z09 Encounter for follow-up examination after completed treatment for conditions other than malignant neoplasm: Secondary | ICD-10-CM

## 2015-06-16 MED ORDER — AZITHROMYCIN 250 MG PO TABS: ORAL_TABLET | ORAL | Status: DC

## 2015-06-16 NOTE — Progress Notes (Signed)
Pre visit review using our clinic review tool, if applicable. No additional management support is needed unless otherwise documented below in the visit note. 

## 2015-06-16 NOTE — Assessment & Plan Note (Signed)
Viral syndrome: Protracted respiratory symptoms, likely developing bronchitis. Will d/c eyedrops, start a Z-Pak. See instructions.

## 2015-06-16 NOTE — Patient Instructions (Signed)
Continue with the same medications except for the addition of an antibiotic.    For cough:  Take Mucinex DM twice a day as needed until better If the cough is severe, try hydrocodone  For nasal congestion: Use OTC Nasocort or Flonase : 2 nasal sprays on each side of the nose in the morning until you feel better   Avoid decongestants such as  Pseudoephedrine or phenylephrine   Take the antibiotic for 5 days  Call if not gradually better over the next  10 days

## 2015-06-16 NOTE — Progress Notes (Signed)
Subjective:    Patient ID: Alice MealyLinda W Howard, female    DOB: 1945/11/05, 70 y.o.   MRN: [id number]  DOS:  06/16/2015 Type of visit - description : Acute visit Interval history: Since the last visit, she is doing better but not completely well: Continue with on and off cough, ears are congested, occasional yellow nasal discharge and off color sputum. Continued to feel tired. Eyes feel better   Review of Systems No fever, chills. No chest pain but she does feel congested in the chest. No difficulty breathing or lower extremity edema No wheezing  Past Medical History  Diagnosis Date  . Hyperlipidemia   . Osteoporosis   . History of colonic polyps     multiple Cscopes,  . Allergic rhinitis   . OA (osteoarthritis)     sees Ortho, 2011 they talked about TKR L  . Baker's cyst     at the L per MRI ordered by ortho aprox 2008 patient thinks is B  . Vertigo     Past Surgical History  Procedure Laterality Date  . Hemorrhoid surgery  1984  . Tubal ligation    . Knee surgery  1994    due to skiing accident.  Has 2 screws in knee. Gets Euflexxa injections.  . Colonoscopy    . 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 Marital Status: Married    Spouse Name: N/A  . Number of Children: 2  . Years of Education: N/A   Occupational History  . curriculum facilitator-- retired 2012  [employer]   Social History Main Topics  . Smoking status: Never Smoker   . Smokeless tobacco: Never Used  . Alcohol Use: 0.0 oz/week     Comment: 2 times a week, wine  . Drug Use: No  . Sexual Activity: Not on file   Other Topics Concern  . Not on file   Social History Narrative   Works part time, busy w/ [employer]   Household-- pt and husband, healthy marriage              Medication  List       This list is accurate as of: 06/16/15  5:28 PM.  Always use your most recent med list.               aspirin EC 81 MG tablet  Take 81 mg by mouth 3 (three) times a week. Reported on 05/08/2015     azithromycin 250 MG tablet  Commonly known as:  ZITHROMAX Z-PAK  2 tabs a day the first day, then 1 tab a day x 4 days     CALCIUM PO  Take 2 tablets by mouth 2 (two) times a week. Reported on 06/05/2015     HYALURONIC ACID PO  Take by mouth daily.     HYDROcodone-homatropine 5-1.5 MG/5ML syrup  Commonly known as:  HYCODAN  Take 5 mLs by mouth at bedtime as needed for cough.     MULTIVITAMIN PO  Take 1 tablet by mouth daily.     NON FORMULARY  Take by mouth 2 (two) times daily. 4-5 gin soaked golden raisins BID per ortho     simvastatin 20 MG tablet  Commonly known as:  ZOCOR  Take 1 tablet (20 mg total) by  mouth every evening.     TURMERIC PO  Take by mouth daily.     VITAMIN D-3 PO  Take by mouth daily.           Objective:   Physical Exam BP 124/76 mmHg  Pulse 66  Temp(Src) 97.8 F (36.6 C) (Oral)  Ht  (1.626 m)  Wt 163 lb (73.936 kg)  BMI 27.97 kg/m2  SpO2 97% General:   Well developed, well nourished . NAD.  HEENT:  Normocephalic . Face symmetric, atraumatic X line conjunctiva was normal today. Unable to see the TMs due to wax Nose congested Sinuses slightly TTP, worse on the right. Throat symmetric, no red Lungs:  No wheezing, few rhonchi with cough only Normal respiratory effort, no intercostal retractions, no accessory muscle use. Heart: RRR,  no murmur.  No pretibial edema bilaterally  Skin: Not pale. Not jaundice Neurologic:  alert & oriented X3.  Speech normal, gait appropriate for age and unassisted Psych--  Cognition and judgment appear intact.  Cooperative with normal attention span and concentration.  Behavior appropriate. No anxious or depressed appearing.      Assessment & Plan:    Assessment Hyperlipidemia Osteoporosis DJD Baker's cyst cyst, per MRI 2008 R groin -distal RLQ pain on-off,   2013 abdomen and pelvic CT   (-). Saw ortho:  not related to DJD of the hip.   PLAN:  Viral syndrome: Protracted respiratory symptoms, likely developing bronchitis. Will d/c eyedrops, start a Z-Pak. See instructions.

## 2015-11-08 ENCOUNTER — Ambulatory Visit: Payer: Medicare Other | Admitting: Internal Medicine

## 2015-12-11 ENCOUNTER — Ambulatory Visit (INDEPENDENT_AMBULATORY_CARE_PROVIDER_SITE_OTHER): Payer: Medicare Other | Admitting: Internal Medicine

## 2015-12-11 ENCOUNTER — Encounter: Payer: Self-pay | Admitting: Internal Medicine

## 2015-12-11 VITALS — BP 118/62 | HR 69 | Temp 97.6°F | Resp 14 | Ht 64.0 in | Wt 155.4 lb

## 2015-12-11 DIAGNOSIS — R42 Dizziness and giddiness: Secondary | ICD-10-CM

## 2015-12-11 DIAGNOSIS — E785 Hyperlipidemia, unspecified: Secondary | ICD-10-CM | POA: Diagnosis not present

## 2015-12-11 DIAGNOSIS — J3089 Other allergic rhinitis: Secondary | ICD-10-CM | POA: Diagnosis not present

## 2015-12-11 MED ORDER — AZELASTINE HCL 0.1 % NA SOLN: 2.0000 | Freq: Every evening | NASAL | 10 refills | Status: DC | PRN

## 2015-12-11 NOTE — Patient Instructions (Addendum)
  GO TO THE FRONT DESK Schedule your next appointment for a physical exam by February 2017     For allergies: Use OTC Nasocort or Flonase : 2 nasal sprays on each side of the nose in the morning  Use ASTELIN a prescribed spray : 2 nasal sprays on each side of the nose at night for a few weeks and then as needed

## 2015-12-11 NOTE — Progress Notes (Signed)
Pre visit review using our clinic review tool, if applicable. No additional management support is needed unless otherwise documented below in the visit note. 

## 2015-12-11 NOTE — Assessment & Plan Note (Signed)
Hyperlipidemia: Controlled. Continue simvastatin Allergic rhinitis: Continue Flonase daily and add Astelin at night for a few weeks and then as needed Occasional dizziness: Very vague symptoms, neuro ROS (-); Rx observation for now. RTC  04-2015 CPX

## 2015-12-11 NOTE — Progress Notes (Signed)
Subjective:    Patient ID: Alice Howard, female    DOB: 02-17-1946, 70 y.o.   MRN: [id number]  DOS:  12/11/2015 Type of visit - description : rov Interval history: In general feeling well, she has been very active in the summer, loss some weight. Has chronic sinus congestion, taking Mucinex and Flonase most days. Symptoms are not completely controlled. Has felt lightheaded sometimes, mostly when she stands up but sometimes spontaneously, symptoms last few seconds and there are no associates sx .   Review of Systems Denies fever, chills, sore throat. No postnasal dripping. No CP-SOB Very little cough No headache, diplopia, slurred speech, motor deficits  Past Medical History:  Diagnosis Date  . Allergic rhinitis   . Baker's cyst    at the L per MRI ordered by ortho aprox 2008 patient thinks is B  . History of colonic polyps    multiple Cscopes,  . Hyperlipidemia   . OA (osteoarthritis)    sees Ortho, 2011 they talked about TKR L  . Osteoporosis   . Vertigo     Past Surgical History:  Procedure Laterality Date  . HEMORRHOID SURGERY  1984  . KNEE SURGERY  1994   due to skiing accident.  Has 2 screws in knee. Gets Euflexxa injections.  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 TUBAL LIGATION      Social History   Social History  . Marital status: Married    Spouse name: N/A  . Number of children: 2  . Years of education: N/A   Occupational History  . curriculum facilitator-- retired 2012  [employer]   Social History Main Topics  . Smoking status: Never Smoker  . Smokeless tobacco: Never Used  . Alcohol use 0.0 oz/week     Comment: 2 times a week, wine  . Drug use: No  . Sexual activity: Not on file   Other Topics Concern  . Not on file   Social History Narrative   Works part time,  busy w/ [employer]-- pt and husband, healthy marriage              Medication List       Accurate as of 12/11/15  8:51 AM. Always use your most recent med list.          aspirin EC 81 MG tablet Take 81 mg by mouth 3 (three) times a week. Reported on 05/08/2015   azithromycin 250 MG tablet Commonly known as:  ZITHROMAX Z-PAK 2 tabs a day the first day, then 1 tab a day x 4 days   CALCIUM PO Take 2 tablets by mouth 2 (two) times a week. Reported on 06/05/2015   HYALURONIC ACID PO Take by mouth daily.   HYDROcodone-homatropine 5-1.5 MG/5ML syrup Commonly known as:  HYCODAN Take 5 mLs by mouth at bedtime as needed for cough.   MULTIVITAMIN PO Take 1 tablet by mouth daily.   NON FORMULARY Take by mouth 2 (two) times daily. 4-5 gin soaked golden raisins BID per ortho   simvastatin 20 MG tablet Commonly known as:  ZOCOR Take 1 tablet (20 mg total) by mouth every evening.   TURMERIC PO Take by mouth daily.   VITAMIN D-3 PO Take by mouth daily.          Objective:  Physical Exam BP 118/62 (BP Location: Left Arm, Patient Position: Sitting, Cuff Size: Small)   Pulse 69   Temp 97.6 F (36.4 C) (Oral)   Resp 14   Ht 5\' 4"  (1.626 m)   Wt 155 lb 6 oz (70.5 kg)   SpO2 97%   BMI 26.67 kg/m  General:   Well developed, well nourished . NAD.  HEENT:  Normocephalic . Face symmetric, atraumatic. TM unable to visualize, nose not congested  Lungs:  CTA B Normal respiratory effort, no intercostal retractions, no accessory muscle use. Heart: RRR,  no murmur.  No pretibial edema bilaterally  Skin: Not pale. Not jaundice Neurologic:  alert & oriented X3.  Speech normal, gait appropriate for age and unassisted Psych--  Cognition and judgment appear intact.  Cooperative with normal attention span and concentration.  Behavior appropriate. No anxious or depressed appearing.      Assessment & Plan:  Assessment Hyperlipidemia Osteoporosis T score 2009  -2.6, , 2011 -2.4 , 2013 -2.3,  2017 -2.2 [place] started a holiday 2015 after 5 years DJD Baker's cyst cyst, per MRI 2008 R groin -distal RLQ pain on-off,   2013 abdomen and pelvic CT   (-). Saw ortho:  not related to DJD of the hip.   PLAN:  Hyperlipidemia: Controlled. Continue simvastatin Allergic rhinitis: Continue Flonase daily and add Astelin at night for a few weeks and then as needed Occasional dizziness: Very vague symptoms, neuro ROS (-); Rx observation for now. RTC  04-2015 CPX

## 2016-04-06 ENCOUNTER — Other Ambulatory Visit: Payer: Self-pay | Admitting: Internal Medicine

## 2016-04-29 ENCOUNTER — Other Ambulatory Visit: Payer: Self-pay | Admitting: Internal Medicine

## 2016-04-29 DIAGNOSIS — Z1231 Encounter for screening mammogram for malignant neoplasm of breast: Secondary | ICD-10-CM

## 2016-05-09 ENCOUNTER — Encounter (HOSPITAL_BASED_OUTPATIENT_CLINIC_OR_DEPARTMENT_OTHER): Payer: Self-pay

## 2016-05-09 ENCOUNTER — Ambulatory Visit (HOSPITAL_BASED_OUTPATIENT_CLINIC_OR_DEPARTMENT_OTHER)
Admission: RE | Admit: 2016-05-09 | Discharge: 2016-05-09 | Disposition: A | Discharge status: Home / Self Care | Payer: Medicare Other | Source: Ambulatory Visit | Attending: Internal Medicine | Admitting: Internal Medicine

## 2016-05-09 DIAGNOSIS — Z1231 Encounter for screening mammogram for malignant neoplasm of breast: Secondary | ICD-10-CM

## 2016-05-09 NOTE — Progress Notes (Addendum)
Subjective:   Alice Howard is a 71 y.o. female who presents for Medicare Annual (Subsequent) preventive examination.  Review of Systems:  No ROS.  Medicare Wellness Visit.  Cardiac Risk Factors include: advanced age (>64men, >26 women);dyslipidemia;family history of premature cardiovascular disease  Sleep patterns: no sleep issues, feels rested on waking and does not get up to void.   Home Safety/Smoke Alarms: Feels safe in home. Smoke alarms in place.  Living environment; residence and Firearm Safety: Lives w/ husband. 3 story house. No issues navigating stairs. firearms stored safely. Seat Belt Safety/Bike Helmet: Wears seat belt.   Counseling:   Eye Exam- [facility] yearly. Wears contacts. No vision issues. Dental- Dr. Jonn Shingles twice yearly.  Female:   Pap- Follows w/ Dr. Jackelyn Knife.      Mammo- last 05/09/16. BI-RADS CATEGORY  1: Negative.     Dexa scan- last 06/09/15. Osteopenia.  PCP recommended calcium + vitamin D. CCS- last 02/14/12. 1 polyp removed. 5 year recall.     Objective:     Vitals: BP 122/76 (BP Location: Left Arm, Patient Position: Sitting, Cuff Size: Normal)   Pulse 70   Temp 98 F (36.7 C) (Oral)   Resp 14   Ht 5\' 4"  (1.626 m)   Wt 159 lb 4 oz (72.2 kg)   SpO2 98%   BMI 27.34 kg/m   Body mass index is 27.34 kg/m.  Wt Readings from Last 3 Encounters:  05/10/16 159 lb 4 oz (72.2 kg)  12/11/15 155 lb 6 oz (70.5 kg)  06/16/15 163 lb (73.9 kg)   Tobacco History  Smoking Status  . Never Smoker  Smokeless Tobacco  . Never Used     Counseling given: No   Past Medical History:  Diagnosis Date  . Allergic rhinitis   . Baker's cyst    at the L per MRI ordered by ortho aprox 2008 patient thinks is B  . History of colonic polyps    multiple Cscopes,  . Hyperlipidemia   . OA (osteoarthritis)    sees Ortho, 2011 they talked about TKR L  . Osteoporosis   . Vertigo    Past Surgical History:  Procedure Laterality Date  . HEMORRHOID  SURGERY  1984  . KNEE SURGERY  1994   due to skiing accident.  Has 2 screws in knee. Gets Euflexxa injections.  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 TUBAL LIGATION     Family History  Problem Relation Age of Onset  . Colon cancer Mother   . Hypertension Mother   . Cancer Father     "myeloid metaplasia? similar to leukemia"  . Heart attack Other 70    GMx 2   . Breast cancer Neg Hx   . Diabetes Neg Hx    History  Sexual Activity  . Sexual activity: Not on file    Outpatient Encounter Prescriptions as of 05/10/2016  Medication Sig  . azelastine (ASTELIN) 0.1 % nasal spray Place 2 sprays into both nostrils at bedtime as needed for rhinitis. Use in each nostril as directed  . Cholecalciferol (VITAMIN D-3 PO) Take by mouth daily.  . Multiple Vitamins-Minerals (MULTIVITAMIN PO) Take 1 tablet by mouth daily.  . NON FORMULARY Take by mouth 2 (two) times daily. 4-5 gin soaked golden raisins BID per ortho  . simvastatin (ZOCOR) 20  MG tablet Take 1 tablet (20 mg total) by mouth at bedtime.  . TURMERIC PO Take by mouth daily.  Marland Kitchen aspirin EC 81 MG tablet Take 81 mg by mouth 3 (three) times a week. Reported on 05/08/2015  . Hyaluronic Acid-Vitamin C (HYALURONIC ACID PO) Take by mouth daily.   No facility-administered encounter medications on file as of 05/10/2016.     Activities of Daily Living In your present state of health, do you have any difficulty performing the following activities: 05/10/2016 12/11/2015  Hearing? N N  Vision? N N  Difficulty concentrating or making decisions? N N  Walking or climbing stairs? N N  Dressing or bathing? N N  Doing errands, shopping? N N  Preparing Food and eating ? N -  Using the Toilet? N -  Do you have problems with loss of bowel control? N -  Managing your Medications? N -  Managing your  Finances? N -  Housekeeping or managing your Housekeeping? N -  Some recent data might be hidden    Patient Care Team: Wanda Plump, MD as PCP - General Lavina Hamman, MD as Consulting Physician (Obstetrics and Gynecology) Toni Arthurs, MD as Consulting Physician (Orthopedic Surgery) Ulice Bold, [facility] as Counselor (Psychology)    Assessment:    Physical assessment deferred to PCP.  Exercise Activities and Dietary recommendations Current Exercise Habits: Structured exercise class, Type of exercise: Other - see comments;walking, Exercise limited by: Other - see comments (water aerobics)  Diet (meal preparation, eat out, water intake, caffeinated beverages, dairy products, fruits and vegetables): in general, a "healthy" diet  , well balanced, on average, 3 meals per day. Tries to minimize bread/carbs. Tries to drink lots of water.  Breakfast: Eggs or granola bar and coffee Lunch: salad Dinner: varies-stir fry, meat/veggies w/ fresh fruit      Goals    . Healthy Lifestyle          Continue to eat heart healthy diet (full of fruits, vegetables, whole grains, lean protein, water--limit salt, fat, and sugar intake) and increase physical activity as tolerated. Continue doing brain stimulating activities (puzzles, reading, adult coloring books, staying active) to keep memory sharp.       Fall Risk Fall Risk  05/10/2016 12/11/2015 05/09/2015 11/09/2014 03/07/2014  Falls in the past year? No No No No No   Depression Screen PHQ 2/9 Scores 05/10/2016 12/11/2015 05/09/2015 11/09/2014  PHQ - 2 Score 0 0 0 0     Cognitive Function       Ad8 score reviewed for issues:  Issues making decisions: No  Less interest in hobbies / activities: No   Repeats questions, stories (family complaining): No  Trouble using ordinary gadgets (microwave, computer, phone): No  Forgets the month or year: No  Mismanaging finances: No   Remembering appts: No  Daily problems with thinking and/or memory: No    Ad8 score is= 0  Immunization History  Administered Date(s) Administered  . Influenza Split 01/01/2011, 11/21/2011, 12/23/2012  . Influenza Whole 12/28/2009  . Influenza-Unspecified 12/23/2013, 11/02/2014, 12/14/2015  . PPD Test 02/03/2012  . Pneumococcal Conjugate-13 03/07/2014  . Pneumococcal Polysaccharide-23 11/21/2011  . Td 04/11/2005, 05/09/2015  . Zoster 12/10/2010   Screening Tests Health Maintenance  Topic Date Due  . Hepatitis C Screening  1946-01-30  . PNA vac Low Risk Adult (2 of 2 - PPSV23) 11/20/2016  . COLONOSCOPY  02/13/2017  . MAMMOGRAM  05/09/2017  . TETANUS/TDAP  05/08/2025  . INFLUENZA VACCINE  Completed  .  DEXA SCAN  Completed      Plan:   Follow-up w/ PCP as directed.  Hep C screening today per PCP orders.  During the course of the visit the patient was educated and counseled about the following appropriate screening and preventive services:   Vaccines to include Pneumoccal, Influenza, Hepatitis B, Td, Zostavax, HCV  Cardiovascular Disease  Colorectal cancer screening  Bone density screening  Diabetes screening  Glaucoma screening  Mammography/PAP  Nutrition counseling   Patient Instructions (the written plan) was given to the patient.   Starla LinkCarolyn J Javaris Wigington, RN  05/10/2016  Willow OraJose Paz, MD

## 2016-05-10 ENCOUNTER — Ambulatory Visit (INDEPENDENT_AMBULATORY_CARE_PROVIDER_SITE_OTHER): Payer: Medicare Other | Admitting: Internal Medicine

## 2016-05-10 ENCOUNTER — Encounter: Payer: Self-pay | Admitting: Internal Medicine

## 2016-05-10 VITALS — BP 122/76 | HR 70 | Temp 98.0°F | Resp 14 | Ht 64.0 in | Wt 159.2 lb

## 2016-05-10 DIAGNOSIS — Z0000 Encounter for general adult medical examination without abnormal findings: Secondary | ICD-10-CM

## 2016-05-10 DIAGNOSIS — Z1159 Encounter for screening for other viral diseases: Secondary | ICD-10-CM

## 2016-05-10 LAB — HEPATITIS C ANTIBODY: HCV Ab: NEGATIVE

## 2016-05-10 LAB — BASIC METABOLIC PANEL
BUN: 15 mg/dL (ref 6–23)
CO2: 28 meq/L (ref 19–32)
Calcium: 9.2 mg/dL (ref 8.4–10.5)
Chloride: 105 mEq/L (ref 96–112)
Creatinine, Ser: 0.65 mg/dL (ref 0.40–1.20)
GFR: 96.07 mL/min (ref >60.00)
Glucose, Bld: 96 mg/dL (ref 70–99)
POTASSIUM: 4 meq/L (ref 3.5–5.1)
Sodium: 139 mEq/L (ref 135–145)

## 2016-05-10 LAB — LIPID PANEL
Cholesterol: 185 mg/dL (ref 0–200)
HDL: 53.3 mg/dL (ref >39.00)
LDL Cholesterol: 112 mg/dL — ABNORMAL HIGH (ref 0–99)
NonHDL: 131.42
TRIGLYCERIDES: 95 mg/dL (ref 0.0–149.0)
Total CHOL/HDL Ratio: 3
VLDL: 19 mg/dL (ref 0.0–40.0)

## 2016-05-10 LAB — ALT: ALT: 16 U/L (ref 0–35)

## 2016-05-10 LAB — AST: AST: 18 U/L (ref 0–37)

## 2016-05-10 LAB — TSH: TSH: 2.48 u[IU]/mL (ref 0.35–4.50)

## 2016-05-10 NOTE — Assessment & Plan Note (Signed)
Hyperlipidemia: On simvastatin, check labs. Osteoporosis: On vitamin D MSK: Has been recommended to have a right ankle replacement, not ready for that step RTC 1 year

## 2016-05-10 NOTE — Assessment & Plan Note (Addendum)
Td 04-2015;  zostavax 2012 ; Pneumonia 2013; prevnar 03-07-14; Had a flu shot  ---[facility]S: 02/14/12 w/ Dr. Madilyn FiremanHayes; 5mm polyp removed from rectum; recommended f/u 5 yrs ---Saw gyn 10-2013, had a PAP, no records per guidelines, no further pap needed but likes to see gyn one more time, pt will call ---Mammogram 05-2016 neg ---diet-exercise-discussed ---Labs: BMP, AST, ALT, FLP, TSH, hep C

## 2016-05-10 NOTE — Progress Notes (Signed)
Subjective:    Patient ID: Alice Howard, female    DOB: [birth year], 71 y.o.   MRN: [id number]  DOS:  05/10/2016 Type of visit - description : CPX Interval history:No major concerns, good compliance with medications.  Review of Systems  A week ago developed some cough, sinus congestion. Denies fever chills. No difficulty breathing or chest pain. Taking Mucinex, 2 nasal sprays, overall feeling better. Some stress, mostly related to her relationship with one of her sons. She is handling well, denies a need for medication counseling.  Other than above, a 14 point review of systems is negative    Past Medical History:  Diagnosis Date  . Allergic rhinitis   . Baker's cyst    at the L per MRI ordered by ortho aprox 2008 patient thinks is B  . History of colonic polyps    multiple Cscopes,  . Hyperlipidemia   . OA (osteoarthritis)    sees Ortho, 2011 they talked about TKR L  . Osteoporosis   . Vertigo     Past Surgical History:  Procedure Laterality Date  . HEMORRHOID SURGERY  1984  . KNEE SURGERY  1994   due to skiing accident.  Has 2 screws in knee. Gets Euflexxa injections.  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 TUBAL LIGATION      Social History   Social History  . Marital status: Married    Spouse name: N/A  . Number of children: 2  . Years of education: N/A   Occupational History  . curriculum facilitator-- retired 2012  [employer]   Social History Main Topics  . Smoking status: Never Smoker  . Smokeless tobacco: Never Used  . Alcohol use 0.0 oz/week     Comment: 2 times a week, wine  . Drug use: No  . Sexual activity: Not on file   Other Topics Concern  . Not on file   Social History Narrative   Works part time, busy w/ [employer]-- pt and husband, healthy marriage   3  g-children     daughter lives in [place]           Family History  Problem Relation Age of Onset  . Colon cancer Mother   . Hypertension Mother   . Cancer Father     "myeloid metaplasia? similar to leukemia"  . Heart attack Other 70    GMx 2   . Breast cancer Neg Hx   . Diabetes Neg Hx     Allergies as of 05/10/2016   No Known Allergies     Medication List       Accurate as of 05/10/16 11:39 AM. Always use your most recent med list.          aspirin EC 81 MG tablet Take 81 mg by mouth 3 (three) times a week. Reported on 05/08/2015   azelastine 0.1 % nasal spray Commonly known as:  ASTELIN Place 2 sprays into both nostrils at bedtime as needed for rhinitis. Use in each nostril as directed   HYALURONIC ACID PO Take by mouth daily.   MULTIVITAMIN PO Take 1 tablet by mouth daily.   NON FORMULARY Take by mouth 2 (two) times daily. 4-5 gin soaked golden raisins BID per ortho   simvastatin 20 MG tablet Commonly known as:  ZOCOR  Take 1 tablet (20 mg total) by mouth at bedtime.   TURMERIC PO Take by mouth daily.   VITAMIN D-3 PO Take by mouth daily.          Objective:   Physical Exam BP 122/76 (BP Location: Left Arm, Patient Position: Sitting, Cuff Size: Normal)   Pulse 70   Temp 98 F (36.7 C) (Oral)   Resp 14   Ht 5\' 4"  (1.626 m)   Wt 159 lb 4 oz (72.2 kg)   SpO2 98%   BMI 27.34 kg/m   General:   Well developed, well nourished . NAD.  Neck: No  thyromegaly  HEENT:  Normocephalic . Face symmetric, atraumatic Lungs:  CTA B Normal respiratory effort, no intercostal retractions, no accessory muscle use. Heart: RRR,  no murmur.  No pretibial edema bilaterally  Abdomen:  Not distended, soft, non-tender. No rebound or rigidity.   Skin: Exposed areas without rash. Not pale. Not jaundice Neurologic:  alert & oriented X3.  Speech normal, gait   unassisted but slightly limited by right ankle pain. Strength symmetric and appropriate for age.    Psych: Cognition and judgment appear intact.  Cooperative with normal attention span and concentration.  Behavior appropriate. No anxious or depressed appearing.    Assessment & Plan:    Assessment Hyperlipidemia Osteoporosis T score 2009 -2.6, , 2011 -2.4 , 2013 -2.3,  2017 -2.2 [place] started a holiday 2015 after 5 years. T score 05-2015 -2.2-continue vitamin D MSK-- DJD, R ankle pain  Baker's cyst cyst, per MRI 2008 R groin -distal RLQ pain on-off,   2013 abdomen and pelvic CT   (-). Saw ortho:  not related to DJD of the hip. Carotid US 3-17: < 50%, recheck 2 years   PLAN:  Hyperlipidemia: On simvastatin, check labs. Osteoporosis: On vitamin D MSK: Has been recommended to have a right ankle replacement, not ready for that step RTC 1 year

## 2016-05-10 NOTE — Patient Instructions (Addendum)
GO TO THE LAB : Get the blood work     GO TO THE FRONT DESK Schedule your next appointment for a  Physical and medicare wellness in 1 year  Call sooner if needed  See your gynecologist Dr Jackelyn KnifeMeisinger   Bring a copy of your advance directives to your next office visit.

## 2017-02-10 ENCOUNTER — Other Ambulatory Visit: Payer: Self-pay | Admitting: Internal Medicine

## 2017-04-10 LAB — HM COLONOSCOPY

## 2017-05-05 ENCOUNTER — Other Ambulatory Visit: Payer: Self-pay | Admitting: Internal Medicine

## 2017-05-05 DIAGNOSIS — Z1231 Encounter for screening mammogram for malignant neoplasm of breast: Secondary | ICD-10-CM

## 2017-05-14 ENCOUNTER — Inpatient Hospital Stay (HOSPITAL_BASED_OUTPATIENT_CLINIC_OR_DEPARTMENT_OTHER): Admission: RE | Admit: 2017-05-14 | Payer: Medicare Other | Source: Ambulatory Visit

## 2017-05-22 NOTE — Progress Notes (Addendum)
Subjective:   Alice Howard is a 72 y.o. female who presents for Medicare Annual (Subsequent) preventive examination.  Review of Systems: Cardiac Risk Factors include: advanced age (>16men, >16 women);hypertension No ROS.  Medicare Wellness Visit. Additional risk factors are reflected in the social history.  Sleep patterns: Sleeps well 8 hrs. Feels rested. Home Safety/Smoke Alarms: Feels safe in home. Smoke alarms in place.  Living environment; residence and Firearm Safety: Lives 2 story home. Lives with husband.  Seat Belt Safety/Bike Helmet: Wears seat belt.   Female:   Pap- pt states no longer doing screening.     Mammo- scheduled today 05/26/17       Dexa scan-3.31.17 osteopenia        CCS- last 04/10/16 per pt. Recall 3-5 yrs.     Objective:     Vitals: BP 120/68 (BP Location: Left Arm, Patient Position: Sitting, Cuff Size: Normal)   Pulse 65   Ht 5\' 4"  (1.626 m)   Wt 156 lb 3.2 oz (70.9 kg)   SpO2 98%   BMI 26.81 kg/m   Body mass index is 26.81 kg/m.  Advanced Directives 05/26/2017 05/10/2016 09/22/2012 09/16/2012  Does Patient Have a Medical Advance Directive? Yes Yes Patient does not have advance directive;Patient would not like information Patient has advance directive, copy not in chart  Type of Advance Directive Healthcare Power of [place];Living will Healthcare Power of [place];Living will - Living will  Does patient want to make changes to medical advance directive? No - Patient declined - - -  Copy of Healthcare Power of Attorney in Chart? No - copy requested No - copy requested - Copy requested from family  Pre-existing out of facility DNR order (yellow form or pink MOST form) - - - No    Tobacco Social History   Tobacco Use  Smoking Status Never Smoker  Smokeless Tobacco Never Used     Counseling given: Not Answered   Clinical Intake: Pain : No/denies pain    Past Medical History:  Diagnosis Date  . Allergic rhinitis   . Baker's cyst    at the L  per MRI ordered by ortho aprox 2008 patient thinks is B  . History of colonic polyps    multiple Cscopes,  . Hyperlipidemia   . OA (osteoarthritis)    sees Ortho, 2011 they talked about TKR L  . Osteoporosis   . Vertigo    Past Surgical History:  Procedure Laterality Date  . HEMORRHOID SURGERY  1984  . KNEE SURGERY  1994   due to skiing accident.  Has 2 screws in knee. Gets Euflexxa injections.  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 TUBAL LIGATION     Family History  Problem Relation Age of Onset  . Colon cancer Mother   . Hypertension Mother   . Cancer Father        "myeloid metaplasia? similar to leukemia"  . Heart attack Other 70       GMx 2   . Breast cancer Neg Hx   . Diabetes Neg Hx    Social History   Socioeconomic History  . Marital status: Married    Spouse name: None  . Number of children: 2  . Years of education: None  . Highest education level: None  Social Needs  . Financial resource strain: None  .  Food insecurity - worry: None  . Food insecurity - inability: None  . Transportation needs - medical: None  . Transportation needs - non-medical: None  Occupational History  . Occupation: Engineer, maintenance (IT)curriculum facilitator-- retired 2012     Employer: [employer]  Tobacco Use  . Smoking status: Never Smoker  . Smokeless tobacco: Never Used  Substance and Sexual Activity  . Alcohol use: Yes    Alcohol/week: 0.0 oz    Comment: 2 times a week, wine  . Drug use: No  . Sexual activity: Yes  Other Topics Concern  . None  Social History Narrative   Works part time, busy w/ [employer]   Household-- pt and husband, healthy marriage   3 g-children     daughter lives in [place]       Outpatient Encounter Medications as of 05/26/2017  Medication Sig  . aspirin EC 81 MG tablet Take 81 mg by mouth  3 (three) times a week. Reported on 05/08/2015  . azelastine (ASTELIN) 0.1 % nasal spray Place 2 sprays into both nostrils at bedtime as needed for rhinitis. Use in each nostril as directed  . Cholecalciferol (VITAMIN D-3 PO) Take by mouth daily.  . Multiple Vitamins-Minerals (MULTIVITAMIN PO) Take 1 tablet by mouth daily.  . NON FORMULARY Take by mouth 2 (two) times daily. 4-5 gin soaked golden raisins BID per ortho  . simvastatin (ZOCOR) 20 MG tablet Take 1 tablet (20 mg total) by mouth at bedtime.  . TURMERIC PO Take by mouth daily.  Marland Kitchen. Hyaluronic Acid-Vitamin C (HYALURONIC ACID PO) Take by mouth daily.   No facility-administered encounter medications on file as of 05/26/2017.     Activities of Daily Living In your present state of health, do you have any difficulty performing the following activities: 05/26/2017  Hearing? N  Vision? N  Comment wears contacts. Dr.Palmer yearly.  Difficulty concentrating or making decisions? N  Walking or climbing stairs? N  Dressing or bathing? N  Doing errands, shopping? N  Preparing Food and eating ? N  Using the Toilet? N  In the past six months, have you accidently leaked urine? N  Do you have problems with loss of bowel control? N  Managing your Medications? N  Managing your Finances? N  Some recent data might be hidden    Patient Care Team: Wanda PlumpPaz, Jose E, MD as PCP - General Meisinger, Tawanna Coolerodd, MD as Consulting Physician (Obstetrics and Gynecology) Toni ArthursHewitt, John, MD as Consulting Physician (Orthopedic Surgery) Ulice BoldSarvis, [place], [facility] as Counselor (Psychology)    Assessment:   This is a routine wellness examination for Alice Howard. Physical assessment deferred to PCP.  Exercise Activities and Dietary recommendations Current Exercise Habits: Structured exercise class, Type of exercise: strength training/weights(water aerobics), Time (Minutes): 60, Frequency (Times/Week): 3, Weekly Exercise (Minutes/Week): 180, Intensity: Mild Diet (meal preparation, eat  out, water intake, caffeinated beverages, dairy products, fruits and vegetables): in general, a "healthy" diet  , well balanced   Goals    . Grow her skin care    . Weight (lb) < 150 lb (68 kg)       Fall Risk Fall Risk  05/10/2016 12/11/2015 05/09/2015 11/09/2014 03/07/2014  Falls in the past year? No No No No No    Depression Screen PHQ 2/9 Scores 05/26/2017 05/10/2016 12/11/2015 05/09/2015  PHQ - 2 Score 0 0 0 0     Cognitive Function MMSE - Mini Mental State Exam 05/26/2017  Orientation to time 5  Orientation to Place 5  Registration  3  Attention/ Calculation 5  Recall 3  Language- name 2 objects 2  Language- repeat 1  Language- follow 3 step command 3  Language- read & follow direction 1  Write a sentence 1  Copy design 1  Total score 30        Immunization History  Administered Date(s) Administered  . Influenza Split 01/01/2011, 11/21/2011, 12/23/2012  . Influenza Whole 12/28/2009  . Influenza-Unspecified 12/23/2013, 11/02/2014, 12/14/2015  . PPD Test 02/03/2012  . Pneumococcal Conjugate-13 03/07/2014  . Pneumococcal Polysaccharide-23 11/21/2011  . Td 04/11/2005, 05/09/2015  . Zoster 12/10/2010    Screening Tests Health Maintenance  Topic Date Due  . MAMMOGRAM  05/09/2017  . PNA vac Low Risk Adult (2 of 2 - PPSV23) 05/27/2018 (Originally 11/20/2016)  . COLONOSCOPY  04/10/2021  . TETANUS/TDAP  05/08/2025  . INFLUENZA VACCINE  Completed  . DEXA SCAN  Completed  . Hepatitis C Screening  Completed      Plan:   Follow up with Dr.Paz today as scheduled.   Continue to eat heart healthy diet (full of fruits, vegetables, whole grains, lean protein, water--limit salt, fat, and sugar intake) and increase physical activity as tolerated.  Continue doing brain stimulating activities (puzzles, reading, adult coloring books, staying active) to keep memory sharp.   Bring a copy of your living will and/or healthcare power of attorney to your next office visit.   I have  personally reviewed and noted the following in the patient's chart:   . Medical and social history . Use of alcohol, tobacco or illicit drugs  . Current medications and supplements . Functional ability and status . Nutritional status . Physical activity . Advanced directives . List of other physicians . Hospitalizations, surgeries, and ER visits in previous 12 months . Vitals . Screenings to include cognitive, depression, and falls . Referrals and appointments  In addition, I have reviewed and discussed with patient certain preventive protocols, quality metrics, and best practice recommendations. A written personalized care plan for preventive services as well as general preventive health recommendations were provided to patient.     Mady Haagensen [place], [place]  05/26/2017   Willow Ora, MD

## 2017-05-26 ENCOUNTER — Ambulatory Visit (HOSPITAL_BASED_OUTPATIENT_CLINIC_OR_DEPARTMENT_OTHER)
Admission: RE | Admit: 2017-05-26 | Discharge: 2017-05-26 | Disposition: A | Discharge status: Home / Self Care | Payer: Medicare Other | Source: Ambulatory Visit | Attending: Internal Medicine | Admitting: Internal Medicine

## 2017-05-26 ENCOUNTER — Encounter: Payer: Self-pay | Admitting: Internal Medicine

## 2017-05-26 ENCOUNTER — Ambulatory Visit (INDEPENDENT_AMBULATORY_CARE_PROVIDER_SITE_OTHER): Payer: Medicare Other | Admitting: Internal Medicine

## 2017-05-26 ENCOUNTER — Ambulatory Visit (INDEPENDENT_AMBULATORY_CARE_PROVIDER_SITE_OTHER): Payer: Medicare Other | Admitting: *Deleted

## 2017-05-26 ENCOUNTER — Encounter: Payer: Self-pay | Admitting: *Deleted

## 2017-05-26 VITALS — BP 120/68 | HR 65 | Ht 64.0 in | Wt 156.2 lb

## 2017-05-26 VITALS — BP 120/68 | HR 65 | Temp 97.6°F | Resp 16 | Ht 64.0 in | Wt 156.0 lb

## 2017-05-26 DIAGNOSIS — I739 Peripheral vascular disease, unspecified: Secondary | ICD-10-CM

## 2017-05-26 DIAGNOSIS — I779 Disorder of arteries and arterioles, unspecified: Secondary | ICD-10-CM

## 2017-05-26 DIAGNOSIS — Z0000 Encounter for general adult medical examination without abnormal findings: Secondary | ICD-10-CM

## 2017-05-26 DIAGNOSIS — Z1231 Encounter for screening mammogram for malignant neoplasm of breast: Secondary | ICD-10-CM | POA: Diagnosis not present

## 2017-05-26 DIAGNOSIS — E785 Hyperlipidemia, unspecified: Secondary | ICD-10-CM

## 2017-05-26 LAB — COMPREHENSIVE METABOLIC PANEL
ALT: 17 U/L (ref 0–35)
AST: 20 U/L (ref 0–37)
Albumin: 4.3 g/dL (ref 3.5–5.2)
Alkaline Phosphatase: 50 U/L (ref 39–117)
BUN: 15 mg/dL (ref 6–23)
CO2: 28 mEq/L (ref 19–32)
Calcium: 9.2 mg/dL (ref 8.4–10.5)
Chloride: 103 mEq/L (ref 96–112)
Creatinine, Ser: 0.61 mg/dL (ref 0.40–1.20)
GFR: 103.06 mL/min (ref >60.00)
Glucose, Bld: 101 mg/dL — ABNORMAL HIGH (ref 70–99)
POTASSIUM: 3.8 meq/L (ref 3.5–5.1)
SODIUM: 139 meq/L (ref 135–145)
Total Bilirubin: 1 mg/dL (ref 0.2–1.2)
Total Protein: 6.9 g/dL (ref 6.0–8.3)

## 2017-05-26 LAB — LIPID PANEL
Cholesterol: 178 mg/dL (ref 0–200)
HDL: 62.8 mg/dL (ref >39.00)
LDL CALC: 97 mg/dL (ref 0–99)
NonHDL: 114.9
Total CHOL/HDL Ratio: 3
Triglycerides: 90 mg/dL (ref 0.0–149.0)
VLDL: 18 mg/dL (ref 0.0–40.0)

## 2017-05-26 LAB — TSH: TSH: 1.57 u[IU]/mL (ref 0.35–4.50)

## 2017-05-26 LAB — CBC
HEMATOCRIT: 40.2 % (ref 36.0–46.0)
Hemoglobin: 13.6 g/dL (ref 12.0–15.0)
MCHC: 33.7 g/dL (ref 30.0–36.0)
MCV: 93.4 fl (ref 78.0–100.0)
Platelets: 185 10*3/uL (ref 150.0–400.0)
RBC: 4.31 Mil/uL (ref 3.87–5.11)
RDW: 12 % (ref 11.5–15.5)
WBC: 6.2 10*3/uL (ref 4.0–10.5)

## 2017-05-26 NOTE — Progress Notes (Signed)
Subjective:    Patient ID: Alice Howard, female    DOB: 08-20-1945, 72 y.o.   MRN: [id number]  DOS:  05/26/2017 Type of visit - description : CPX Interval history: In general, states that she feels great.   Review of Systems She remains very active, eats healthy. 2 weeks history of mild pain at the right hamstring area.  Denies back pain per se, no swelling, no paresthesias.  She is afraid of DVTs (clinically, no DVT)   Other than above and chronic/stable sxs, a 14 point review of systems is negative     Past Medical History:  Diagnosis Date  . Allergic rhinitis   . Baker's cyst    at the L per MRI ordered by ortho aprox 2008 patient thinks is B  . History of colonic polyps    multiple Cscopes,  . Hyperlipidemia   . OA (osteoarthritis)    sees Ortho, 2011 they talked about TKR L  . Osteoporosis   . Vertigo     Past Surgical History:  Procedure Laterality Date  . HEMORRHOID SURGERY  1984  . KNEE SURGERY  1994   due to skiing accident.  Has 2 screws in knee. Gets Euflexxa injections.  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 TUBAL LIGATION      Social History   Socioeconomic History  . Marital status: Married    Spouse name: Not on file  . Number of children: 2  . Years of education: Not on file  . Highest education level: Not on file  Social Needs  . Financial resource strain: Not on file  . Food insecurity - worry: Not on file  . Food insecurity - inability: Not on file  . Transportation needs - medical: Not on file  . Transportation needs - non-medical: Not on file  Occupational History  . Occupation: Engineer, maintenance (IT)-- retired 2012     Employer: [employer]  Tobacco Use  . Smoking status: Never Smoker  . Smokeless tobacco: Never Used  Substance and Sexual Activity  . Alcohol  use: Yes    Alcohol/week: 0.0 oz    Comment: 2 times a week, wine  . Drug use: No  . Sexual activity: Yes  Other Topics Concern  . Not on file  Social History Narrative   Works part time, busy w/ [employer]-- pt and husband, healthy marriage   3 g-children     daughter lives in [place]        Family History  Problem Relation Age of Onset  . Colon cancer Mother 59  . Hypertension Mother   . Cancer Father 59       "myeloid metaplasia? similar to leukemia"  . Heart attack Other 70       GMx 2   . Breast cancer Neg Hx   . Diabetes Neg Hx      Allergies as of 05/26/2017   No Known Allergies     Medication List        Accurate as of 05/26/17  1:24 PM. Always use your most recent med list.          aspirin EC 81 MG tablet Take 81 mg by mouth 3 (three) times a week. Reported on 05/08/2015   azelastine 0.1 % nasal spray Commonly known as:  ASTELIN Place  2 sprays into both nostrils at bedtime as needed for rhinitis. Use in each nostril as directed   HYALURONIC ACID PO Take by mouth daily.   MULTIVITAMIN PO Take 1 tablet by mouth daily.   NON FORMULARY Take by mouth 2 (two) times daily. 4-5 gin soaked golden raisins BID per ortho   simvastatin 20 MG tablet Commonly known as:  ZOCOR Take 1 tablet (20 mg total) by mouth at bedtime.   TURMERIC PO Take by mouth daily.   VITAMIN D-3 PO Take by mouth daily.          Objective:   Physical Exam BP 120/68 (BP Location: Left Arm, Patient Position: Sitting, Cuff Size: Small)   Pulse 65   Temp 97.6 F (36.4 C) (Oral)   Resp 16   Ht 5\' 4"  (1.626 m)   Wt 156 lb (70.8 kg)   SpO2 98%   BMI 26.78 kg/m   General:   Well developed, well nourished . NAD.  Neck: No  thyromegaly.  Good carotid pulses bilaterally HEENT:  Normocephalic . Face symmetric, atraumatic Lungs:  CTA B Normal respiratory effort, no intercostal retractions, no accessory muscle use. Heart: RRR,  no murmur.  No pretibial edema  bilaterally  Abdomen:  Not distended, soft, non-tender. No rebound or rigidity. MSK: No TTP at the lumbosacral area. Skin: Exposed areas without rash. Not pale. Not jaundice Neurologic:  alert & oriented X3.  Speech normal, gait appropriate for age and unassisted Strength symmetric and appropriate for age. DTR symmetric, straight leg test negative  psych: Cognition and judgment appear intact.  Cooperative with normal attention span and concentration.  Behavior appropriate. No anxious or depressed appearing.     Assessment & Plan:     Assessment Hyperlipidemia Osteoporosis T score 2009 -2.6, , 2011 -2.4 , 2013 -2.3,  2017 -2.2 [place] started a holiday 2015 after 5 years. T score 05-2015 -2.2-continue vitamin D MSK-- DJD, R ankle pain  Baker's cyst cyst, per MRI 2008 R groin -distal RLQ pain on-off,   2013 abdomen and pelvic CT   (-). Saw ortho:  not related to DJD of the hip. Carotid US 3-17: < 50%, recheck 2 years   PLAN:  Hyperlipidemia: Continue simvastatin, checking labs Osteoporosis: On calcium, vitamin D and exercise. Right groin/RLQ pain: On and off problems since 2013, not a major issue at this point Mild carotid disease per US 2017: Recheck. Hamstring pain: Patient concerned about DVT, no evidence of such on clinical grounds, recommend stretching, heating pad and Tylenol.  Call if not better. RTC 1 year

## 2017-05-26 NOTE — Assessment & Plan Note (Signed)
Hyperlipidemia: Continue simvastatin, checking labs Osteoporosis: On calcium, vitamin D and exercise. Right groin/RLQ pain: On and off problems since 2013, not a major issue at this point Mild carotid disease per US 2017: Recheck. Hamstring pain: Patient concerned about DVT, no evidence of such on clinical grounds, recommend stretching, heating pad and Tylenol.  Call if not better. RTC 1 year

## 2017-05-26 NOTE — Assessment & Plan Note (Addendum)
--  Td 04-2015;  zostavax 2012 ; Pneumonia 2013; prevnar 03-07-14 ---CCS: 02/14/12 w/ Dr. Madilyn FiremanHayes; 5mm polyp removed from rectum; had a cscope  04/10/2016 will get copy ---plans to see gyn again, declined referral   ---Mammogram 05-2016 neg, f/u [facility] today ---diet-exercise-discussed ---Labs: CMP, FLP, CBC, TSH

## 2017-05-26 NOTE — Patient Instructions (Signed)
GO TO THE LAB : Get the blood work     GO TO THE FRONT DESK Schedule your next appointment   for a physical exam in 1 year 

## 2017-05-26 NOTE — Patient Instructions (Addendum)
Continue to eat heart healthy diet (full of fruits, vegetables, whole grains, lean protein, water--limit salt, fat, and sugar intake) and increase physical activity as tolerated.  Continue doing brain stimulating activities (puzzles, reading, adult coloring books, staying active) to keep memory sharp.   Bring a copy of your living will and/or healthcare power of attorney to your next office visit.   Alice Howard , Thank you for taking time to come for your Medicare Wellness Visit. I appreciate your ongoing commitment to your health goals. Please review the following plan we discussed and let me know if I can assist you in the future.   These are the goals we discussed: Goals    . Grow her skin care    . Weight (lb) < 150 lb (68 kg)       This is a list of the screening recommended for you and due dates:  Health Maintenance  Topic Date Due  . Mammogram  05/09/2017  . Pneumonia vaccines (2 of 2 - PPSV23) 05/27/2018*  . Colon Cancer Screening  04/10/2021  . Tetanus Vaccine  05/08/2025  . Flu Shot  Completed  . DEXA scan (bone density measurement)  Completed  .  Hepatitis C: One time screening is recommended by Center for Disease Control  (CDC) for  adults born from [birth year] through 1963.   Completed  *Topic was postponed. The date shown is not the original due date.    Health Maintenance for Postmenopausal Women Menopause is a normal process in which your reproductive ability comes to an end. This process happens gradually over a span of months to years, usually between the ages of 53 and 2. Menopause is complete when you have missed 12 consecutive menstrual periods. It is important to talk with your health care provider about some of the most common conditions that affect postmenopausal women, such as heart disease, cancer, and bone loss (osteoporosis). Adopting a healthy lifestyle and getting preventive care can help to promote your health and wellness. Those actions can also lower your  chances of developing some of these common conditions. What should I know about menopause? During menopause, you may experience a number of symptoms, such as:  Moderate-to-severe hot flashes.  Night sweats.  Decrease in sex drive.  Mood swings.  Headaches.  Tiredness.  Irritability.  Memory problems.  Insomnia.  Choosing to treat or not to treat menopausal changes is an individual decision that you make with your health care provider. What should I know about hormone replacement therapy and supplements? Hormone therapy products are effective for treating symptoms that are associated with menopause, such as hot flashes and night sweats. Hormone replacement carries certain risks, especially as you become older. If you are thinking about using estrogen or estrogen with progestin treatments, discuss the benefits and risks with your health care provider. What should I know about heart disease and stroke? Heart disease, heart attack, and stroke become more likely as you age. This may be due, in part, to the hormonal changes that your body experiences during menopause. These can affect how your body processes dietary fats, triglycerides, and cholesterol. Heart attack and stroke are both medical emergencies. There are many things that you can do to help prevent heart disease and stroke:  Have your blood pressure checked at least every 1-2 years. High blood pressure causes heart disease and increases the risk of stroke.  If you are 36-48 years old, ask your health care provider if you should take aspirin to prevent a  heart attack or a stroke.  Do not use any tobacco products, including cigarettes, chewing tobacco, or electronic cigarettes. If you need help quitting, ask your health care provider.  It is important to eat a healthy diet and maintain a healthy weight. ? Be sure to include plenty of vegetables, fruits, low-fat dairy products, and lean protein. ? Avoid eating foods that are  high in solid fats, added sugars, or salt (sodium).  Get regular exercise. This is one of the most important things that you can do for your health. ? Try to exercise for at least 150 minutes each week. The type of exercise that you do should increase your heart rate and make you sweat. This is known as moderate-intensity exercise. ? Try to do strengthening exercises at least twice each week. Do these in addition to the moderate-intensity exercise.  Know your numbers.Ask your health care provider to check your cholesterol and your blood glucose. Continue to have your blood tested as directed by your health care provider.  What should I know about cancer screening? There are several types of cancer. Take the following steps to reduce your risk and to catch any cancer development as early as possible. Breast Cancer  Practice breast self-awareness. ? This means understanding how your breasts normally appear and feel. ? It also means doing regular breast self-exams. Let your health care provider know about any changes, no matter how small.  If you are 60 or older, have a clinician do a breast exam (clinical breast exam or CBE) every year. Depending on your age, family history, and medical history, it may be recommended that you also have a yearly breast X-ray (mammogram).  If you have a family history of breast cancer, talk with your health care provider about genetic screening.  If you are at high risk for breast cancer, talk with your health care provider about having an MRI and a mammogram every year.  Breast cancer (BRCA) gene test is recommended for women who have family members with BRCA-related cancers. Results of the assessment will determine the need for genetic counseling and BRCA1 and for BRCA2 testing. BRCA-related cancers include these types: ? Breast. This occurs in males or females. ? Ovarian. ? Tubal. This may also be called fallopian tube cancer. ? Cancer of the abdominal or  pelvic lining (peritoneal cancer). ? Prostate. ? Pancreatic.  Cervical, Uterine, and Ovarian Cancer Your health care provider may recommend that you be screened regularly for cancer of the pelvic organs. These include your ovaries, uterus, and vagina. This screening involves a pelvic exam, which includes checking for microscopic changes to the surface of your cervix (Pap test).  For women ages 21-65, health care providers may recommend a pelvic exam and a Pap test every three years. For women ages 48-65, they may recommend the Pap test and pelvic exam, combined with testing for human papilloma virus (HPV), every five years. Some types of HPV increase your risk of cervical cancer. Testing for HPV may also be done on women of any age who have unclear Pap test results.  Other health care providers may not recommend any screening for nonpregnant women who are considered low risk for pelvic cancer and have no symptoms. Ask your health care provider if a screening pelvic exam is right for you.  If you have had past treatment for cervical cancer or a condition that could lead to cancer, you need Pap tests and screening for cancer for at least 20 years after your treatment.  If Pap tests have been discontinued for you, your risk factors (such as having a new sexual partner) need to be reassessed to determine if you should start having screenings again. Some women have medical problems that increase the chance of getting cervical cancer. In these cases, your health care provider may recommend that you have screening and Pap tests more often.  If you have a family history of uterine cancer or ovarian cancer, talk with your health care provider about genetic screening.  If you have vaginal bleeding after reaching menopause, tell your health care provider.  There are currently no reliable tests available to screen for ovarian cancer.  Lung Cancer Lung cancer screening is recommended for adults 6-79 years old  who are at high risk for lung cancer because of a history of smoking. A yearly low-dose CT scan of the lungs is recommended if you:  Currently smoke.  Have a history of at least 30 pack-years of smoking and you currently smoke or have quit within the past 15 years. A pack-year is smoking an average of one pack of cigarettes per day for one year.  Yearly screening should:  Continue until it has been 15 years since you quit.  Stop if you develop a health problem that would prevent you from having lung cancer treatment.  Colorectal Cancer  This type of cancer can be detected and can often be prevented.  Routine colorectal cancer screening usually begins at age 42 and continues through age 5.  If you have risk factors for colon cancer, your health care provider may recommend that you be screened at an earlier age.  If you have a family history of colorectal cancer, talk with your health care provider about genetic screening.  Your health care provider may also recommend using home test kits to check for hidden blood in your stool.  A small camera at the end of a tube can be used to examine your colon directly (sigmoidoscopy or colonoscopy). This is done to check for the earliest forms of colorectal cancer.  Direct examination of the colon should be repeated every 5-10 years until age 51. However, if early forms of precancerous polyps or small growths are found or if you have a family history or genetic risk for colorectal cancer, you may need to be screened more often.  Skin Cancer  Check your skin from head to toe regularly.  Monitor any moles. Be sure to tell your health care provider: ? About any new moles or changes in moles, especially if there is a change in a mole's shape or color. ? If you have a mole that is larger than the size of a pencil eraser.  If any of your family members has a history of skin cancer, especially at a young age, talk with your health care provider  about genetic screening.  Always use sunscreen. Apply sunscreen liberally and repeatedly throughout the day.  Whenever you are outside, protect yourself by wearing long sleeves, pants, a wide-brimmed hat, and sunglasses.  What should I know about osteoporosis? Osteoporosis is a condition in which bone destruction happens more quickly than new bone creation. After menopause, you may be at an increased risk for osteoporosis. To help prevent osteoporosis or the bone fractures that can happen because of osteoporosis, the following is recommended:  If you are 56-82 years old, get at least 1,000 mg of calcium and at least 600 mg of vitamin D per day.  If you are older than age 73  but younger than age 40, get at least 1,200 mg of calcium and at least 600 mg of vitamin D per day.  If you are older than age 32, get at least 1,200 mg of calcium and at least 800 mg of vitamin D per day.  Smoking and excessive alcohol intake increase the risk of osteoporosis. Eat foods that are rich in calcium and vitamin D, and do weight-bearing exercises several times each week as directed by your health care provider. What should I know about how menopause affects my mental health? Depression may occur at any age, but it is more common as you become older. Common symptoms of depression include:  Low or sad mood.  Changes in sleep patterns.  Changes in appetite or eating patterns.  Feeling an overall lack of motivation or enjoyment of activities that you previously enjoyed.  Frequent crying spells.  Talk with your health care provider if you think that you are experiencing depression. What should I know about immunizations? It is important that you get and maintain your immunizations. These include:  Tetanus, diphtheria, and pertussis (Tdap) booster vaccine.  Influenza every year before the flu season begins.  Pneumonia vaccine.  Shingles vaccine.  Your health care provider may also recommend other  immunizations. This information is not intended to replace advice given to you by your health care provider. Make sure you discuss any questions you have with your health care provider. Document Released: 04/19/2005 Document Revised: 09/15/2015 Document Reviewed: 11/29/2014 Elsevier Interactive Patient Education  2018 [employer].

## 2017-05-28 ENCOUNTER — Encounter: Payer: Self-pay | Admitting: Internal Medicine

## 2017-06-02 ENCOUNTER — Ambulatory Visit (HOSPITAL_BASED_OUTPATIENT_CLINIC_OR_DEPARTMENT_OTHER)
Admission: RE | Admit: 2017-06-02 | Discharge: 2017-06-02 | Disposition: A | Discharge status: Home / Self Care | Payer: Medicare Other | Source: Ambulatory Visit | Attending: Internal Medicine | Admitting: Internal Medicine

## 2017-06-02 DIAGNOSIS — I779 Disorder of arteries and arterioles, unspecified: Secondary | ICD-10-CM | POA: Insufficient documentation

## 2017-06-02 DIAGNOSIS — I6523 Occlusion and stenosis of bilateral carotid arteries: Secondary | ICD-10-CM | POA: Insufficient documentation

## 2017-06-02 DIAGNOSIS — I739 Peripheral vascular disease, unspecified: Secondary | ICD-10-CM

## 2017-06-05 ENCOUNTER — Encounter: Payer: Self-pay | Admitting: Internal Medicine

## 2017-08-09 ENCOUNTER — Other Ambulatory Visit: Payer: Self-pay | Admitting: Internal Medicine

## 2017-08-11 ENCOUNTER — Encounter: Payer: Self-pay | Admitting: Internal Medicine

## 2017-08-11 ENCOUNTER — Ambulatory Visit: Payer: Medicare Other | Admitting: Internal Medicine

## 2017-08-11 VITALS — BP 124/74 | HR 79 | Temp 97.9°F | Resp 14 | Ht 64.0 in | Wt 151.4 lb

## 2017-08-11 DIAGNOSIS — R197 Diarrhea, unspecified: Secondary | ICD-10-CM

## 2017-08-11 NOTE — Patient Instructions (Signed)
Diarrhea, Adult Diarrhea is frequent loose and watery bowel movements. Diarrhea can make you feel weak and cause you to become dehydrated. Dehydration can make you tired and thirsty, cause you to have a dry mouth, and decrease how often you urinate. Diarrhea typically lasts 2-3 days. However, it can last longer if it is a sign of something more serious. It is important to treat your diarrhea as told by your health care provider. Follow these instructions at home: Eating and drinking  Follow these recommendations as told by your health care provider:  Take an oral rehydration solution (ORS). This is a drink that is sold at pharmacies and retail stores.  Drink clear fluids, such as water, ice chips, diluted fruit juice, and low-calorie sports drinks.  Eat bland, easy-to-digest foods in small amounts as you are able. These foods include bananas, applesauce, rice, lean meats, toast, and crackers.  Avoid drinking fluids that contain a lot of sugar or caffeine, such as energy drinks, sports drinks, and soda.  Avoid alcohol.  Avoid spicy or fatty foods.  General instructions  Drink enough fluid to keep your urine clear or pale yellow.  Wash your hands often. If soap and water are not available, use hand sanitizer.  Make sure that all people in your household wash their hands well and often.  Take over-the-counter and prescription medicines only as told by your health care provider.  Rest at home while you recover.  Watch your condition for any changes.  Take a warm bath to relieve any burning or pain from frequent diarrhea episodes.  Keep all follow-up visits as told by your health care provider. This is important. Contact a health care provider if:  You have a fever.  Your diarrhea gets worse.  You have new symptoms.  You cannot keep fluids down.  You feel light-headed or dizzy.  You have a headache  You have muscle cramps. Get help right away if:  You have chest  pain.  You feel extremely weak or you faint.  You have bloody or black stools or stools that look like tar.  You have severe pain, cramping, or bloating in your abdomen.  You have trouble breathing or you are breathing very quickly.  Your heart is beating very quickly.  Your skin feels cold and clammy.  You feel confused.  You have signs of dehydration, such as: ? Dark urine, very little urine, or no urine. ? Cracked lips. ? Dry mouth. ? Sunken eyes. ? Sleepiness. ? Weakness. This information is not intended to replace advice given to you by your health care provider. Make sure you discuss any questions you have with your health care provider. Document Released: 02/15/2002 Document Revised: 07/06/2015 Document Reviewed: 11/01/2014 Elsevier Interactive Patient Education  2018 [employer] Inc[employer]  

## 2017-08-11 NOTE — Progress Notes (Signed)
Pre visit review using our clinic review tool, if applicable. No additional management support is needed unless otherwise documented below in the visit note. 

## 2017-08-11 NOTE — Progress Notes (Signed)
Subjective:    Patient ID: Alice Howard, female    DOB: 12-05-1945, 72 y.o.   MRN: [id number]  DOS:  08/11/2017 Type of visit - description :  Acute visit  nterval history: Symptoms started approximately 7 days ago with watery diarrhea on and off associated with mild abdominal cramping. Symptoms got better temporarily with bland diet, Imodium. Husband  had similar symptoms 3 weeks ago but they lasted only a couple of days. Last Imodium was this morning, no BM since then.   Review of Systems No fever, chills. No blood in the stools No nausea or vomiting She did has some mucus in the stools. She went to the beach 3 weeks ago and had fried oysters and crabs.  Past Medical History:  Diagnosis Date  . Allergic rhinitis   . Baker's cyst    at the L per MRI ordered by ortho aprox 2008 patient thinks is B  . History of colonic polyps    multiple Cscopes,  . Hyperlipidemia   . OA (osteoarthritis)    sees Ortho, 2011 they talked about TKR L  . Osteoporosis   . Vertigo     Past Surgical History:  Procedure Laterality Date  . HEMORRHOID SURGERY  1984  . KNEE SURGERY  1994   due to skiing accident.  Has 2 screws in knee. Gets Euflexxa injections.  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 TUBAL LIGATION      Social History   Socioeconomic History  . Marital status: Married    Spouse name: Not on file  . Number of children: 2  . Years of education: Not on file  . Highest education level: Not on file  Occupational History  . Occupation: Engineer, maintenance (IT)-- retired 2012     Employer: [employer]  Social Needs  . Financial resource strain: Not on file  . Food insecurity:    Worry: Not on file    Inability: Not on file  . Transportation needs:    Medical: Not on file    Non-medical: Not on file    Tobacco Use  . Smoking status: Never Smoker  . Smokeless tobacco: Never Used  Substance and Sexual Activity  . Alcohol use: Yes    Alcohol/week: 0.0 oz    Comment: 2 times a week, wine  . Drug use: No  . Sexual activity: Yes  Lifestyle  . Physical activity:    Days per week: Not on file    Minutes per session: Not on file  . Stress: Not on file  Relationships  . Social connections:    Talks on phone: Not on file    Gets together: Not on file    Attends religious service: Not on file    Active member of club or organization: Not on file    Attends meetings of clubs or organizations: Not on file    Relationship status: Not on file  . Intimate partner violence:    Fear of current or ex partner: Not on file    Emotionally abused: Not on file    Physically abused: Not on file    Forced sexual activity: Not on file  Other Topics Concern  . Not on file  Social History Narrative   Works part time, busy w/ [employer]-- pt and husband, healthy marriage  3 g-children     daughter lives in [place]         Allergies as of 08/11/2017   No Known Allergies     Medication List        Accurate as of 08/11/17 11:59 PM. Always use your most recent med list.          aspirin EC 81 MG tablet Take 81 mg by mouth 3 (three) times a week. Reported on 05/08/2015   azelastine 0.1 % nasal spray Commonly known as:  ASTELIN Place 2 sprays into both nostrils at bedtime as needed for rhinitis. Use in each nostril as directed   HYALURONIC ACID PO Take by mouth daily.   MULTIVITAMIN PO Take 1 tablet by mouth daily.   NON FORMULARY Take by mouth 2 (two) times daily. 4-5 gin soaked golden raisins BID per ortho   simvastatin 20 MG tablet Commonly known as:  ZOCOR Take 1 tablet (20 mg total) by mouth at bedtime.   TURMERIC PO Take by mouth daily.   VITAMIN D-3 PO Take by mouth daily.          Objective:   Physical Exam BP 124/74 (BP Location: Left Arm, Patient  Position: Sitting, Cuff Size: Small)   Pulse 79   Temp 97.9 F (36.6 C) (Oral)   Resp 14   Ht 5\' 4"  (1.626 m)   Wt 151 lb 6 oz (68.7 kg)   SpO2 96%   BMI 25.98 kg/m   General:   Well developed, well nourished . NAD.  HEENT:  Normocephalic . Face symmetric, atraumatic Lungs:  CTA B Normal respiratory effort, no intercostal retractions, no accessory muscle use. Heart: RRR,  no murmur.  no pretibial edema bilaterally  Abdomen:  Not distended, soft, minimal diffuse abdominal tenderness with + bowel sounds.  No mass or rebound Skin: Not pale. Not jaundice Neurologic:  alert & oriented X3.  Speech normal, gait appropriate for age and unassisted Psych--  Cognition and judgment appear intact.  Cooperative with normal attention span and concentration.  Behavior appropriate. No anxious or depressed appearing.     Assessment & Plan:   Assessment Hyperlipidemia Osteoporosis T score 2009 -2.6, , 2011 -2.4 , 2013 -2.3,  2017 -2.2 [place] started a holiday 2015 after 5 years. T score 05-2015 -2.2-continue vitamin D MSK-- DJD, R ankle pain  Baker's cyst cyst, per MRI 2008 R groin -distal RLQ pain on-off,   2013 abdomen and pelvic CT   (-). Saw ortho:  not related to DJD of the hip. Carotid US 3-17: < 50%, recheck 2 years   PLAN:  Acute diarrhea: No red flag symptoms, does not seem dehydrated on clinical grounds. Recommend to push fluids, bland diet, Imodium at most 1 or twice daily otherwise, wait few more days.  If she is not gradually better she will let me know.  She is in agreement

## 2017-08-12 NOTE — Assessment & Plan Note (Signed)
Acute diarrhea: No red flag symptoms, does not seem dehydrated on clinical grounds. Recommend to push fluids, bland diet, Imodium at most 1 or twice daily otherwise, wait few more days.  If she is not gradually better she will let me know.  She is in agreement

## 2018-05-04 ENCOUNTER — Other Ambulatory Visit (HOSPITAL_BASED_OUTPATIENT_CLINIC_OR_DEPARTMENT_OTHER): Payer: Self-pay | Admitting: Internal Medicine

## 2018-05-04 DIAGNOSIS — Z1231 Encounter for screening mammogram for malignant neoplasm of breast: Secondary | ICD-10-CM

## 2018-05-28 ENCOUNTER — Encounter: Payer: Medicare Other | Admitting: Internal Medicine

## 2018-05-28 ENCOUNTER — Ambulatory Visit: Payer: Medicare Other | Admitting: *Deleted

## 2018-05-29 ENCOUNTER — Ambulatory Visit (HOSPITAL_BASED_OUTPATIENT_CLINIC_OR_DEPARTMENT_OTHER): Payer: Medicare Other

## 2018-06-04 NOTE — Progress Notes (Addendum)
I connected with Alice Howard on 06/05/18 at 11:00 AM EDT by a video enabled telemedicine application and verified that I am speaking with the correct person using two identifiers.   Subjective:   Alice Howard is a 73 y.o. female who presents for Medicare Annual (Subsequent) preventive examination.  Reads devotions daily. Sports center 3x/week (currently closed for Covid 19)  Review of Systems: No ROS.  Medicare Wellness Visit. Additional risk factors are reflected in the social history. Cardiac Risk Factors include: advanced age (>51men, >19 women);dyslipidemia Sleep patterns: Sleeps 7-8 hrs . Feels rested Home Safety/Smoke Alarms: Feels safe in home. Smoke alarms in place.  Lives in 2 story home with husband Eye- Dr.Palmer yearly  Female:       Mammo- active order     Dexa scan- ordered       CCS-04/09/17    Objective:     Vitals: uta/ virtual visit/ covid 19  Advanced Directives 06/05/2018 05/26/2017 05/10/2016 09/22/2012 09/16/2012  Does Patient Have a Medical Advance Directive? Yes Yes Yes Patient does not have advance directive;Patient would not like information Patient has advance directive, copy not in chart  Type of Advance Directive Healthcare Power of [place];Living will Healthcare Power of [place];Living will Healthcare Power of [place];Living will - Living will  Does patient want to make changes to medical advance directive? No - Patient declined No - Patient declined - - -  Copy of Healthcare Power of Attorney in Chart? No - copy requested No - copy requested No - copy requested - Copy requested from family  Pre-existing out of facility DNR order (yellow form or pink MOST form) - - - - No    Tobacco Social History   Tobacco Use  Smoking Status Never Smoker  Smokeless Tobacco Never Used     Counseling given: Not Answered   Clinical Intake:     Pain : No/denies pain                 Past Medical History:  Diagnosis Date  . Allergic rhinitis   .  Baker's cyst    at the L per MRI ordered by ortho aprox 2008 patient thinks is B  . History of colonic polyps    multiple Cscopes,  . Hyperlipidemia   . OA (osteoarthritis)    sees Ortho, 2011 they talked about TKR L  . Osteoporosis   . Vertigo    Past Surgical History:  Procedure Laterality Date  . HEMORRHOID SURGERY  1984  . KNEE SURGERY  1994   due to skiing accident.  Has 2 screws in knee. Gets Euflexxa injections.  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 TUBAL LIGATION     Family History  Problem Relation Age of Onset  . Colon cancer Mother 34  . Hypertension Mother   . Cancer Father 76       "myeloid metaplasia? similar to leukemia"  . Heart attack Other 70       GMx 2   . Breast cancer Neg Hx   . Diabetes Neg Hx    Social History   Socioeconomic History  . Marital status: Married    Spouse name: Not on file  . Number of children: 2  . Years of education: Not on file  . Highest education level: Not on file  Occupational History  .  Occupation: Engineer, maintenance (IT)-- retired 2012     Employer: [employer]  Social Needs  . Financial resource strain: Not on file  . Food insecurity:    Worry: Not on file    Inability: Not on file  . Transportation needs:    Medical: Not on file    Non-medical: Not on file  Tobacco Use  . Smoking status: Never Smoker  . Smokeless tobacco: Never Used  Substance and Sexual Activity  . Alcohol use: Yes    Comment: 2 times a week, wine  . Drug use: No  . Sexual activity: Yes  Lifestyle  . Physical activity:    Days per week: Not on file    Minutes per session: Not on file  . Stress: Not on file  Relationships  . Social connections:    Talks on phone: Not on file    Gets together: Not on file    Attends religious service: Not on file    Active member  of club or organization: Not on file    Attends meetings of clubs or organizations: Not on file    Relationship status: Not on file  Other Topics Concern  . Not on file  Social History Narrative   Works part time, busy w/ [employer]-- pt and husband, healthy marriage   3 g-children     daughter lives in [place]       Outpatient Encounter Medications as of 06/05/2018  Medication Sig  . aspirin EC 81 MG tablet Take 81 mg by mouth 3 (three) times a week. Reported on 05/08/2015  . azelastine (ASTELIN) 0.1 % nasal spray Place 2 sprays into both nostrils at bedtime as needed for rhinitis. Use in each nostril as directed  . Cholecalciferol (VITAMIN D-3 PO) Take by mouth daily.  Marland Kitchen Hyaluronic Acid-Vitamin C (HYALURONIC ACID PO) Take by mouth daily.  . Multiple Vitamins-Minerals (MULTIVITAMIN PO) Take 1 tablet by mouth daily.  . NON FORMULARY Take by mouth 2 (two) times daily. 4-5 gin soaked golden raisins BID per ortho  . simvastatin (ZOCOR) 20 MG tablet Take 1 tablet (20 mg total) by mouth at bedtime.  . TURMERIC PO Take by mouth daily.   No facility-administered encounter medications on file as of 06/05/2018.     Activities of Daily Living In your present state of health, do you have any difficulty performing the following activities: 06/05/2018  Hearing? N  Vision? N  Comment wears  contacts.  Difficulty concentrating or making decisions? N  Walking or climbing stairs? N  Dressing or bathing? N  Doing errands, shopping? N  Preparing Food and eating ? N  Using the Toilet? N  In the past six months, have you accidently leaked urine? N  Some recent data might be hidden    Patient Care Team: Wanda Plump, MD as PCP - General [place], Tawanna Cooler, MD as Consulting Physician (Obstetrics and Gynecology) Toni Arthurs, MD as Consulting Physician (Orthopedic Surgery) Ulice Bold, [facility] as Counselor (Psychology)    Assessment:   This is a routine wellness examination for  [place].Physical assessment deferred to PCP.  Exercise Activities and Dietary recommendations Current Exercise Habits: Home exercise routine, Type of exercise: walking;calisthenics, Frequency (Times/Week): 3, Intensity: Mild, Exercise limited by: None identified   Diet (meal preparation, eat out, water intake, caffeinated beverages, dairy products, fruits and vegetables): in general, a "healthy" diet  , well balanced, on average, 3 meals per day. Water- drinks plenty throughout the day.  Goals    . Grow her skin care    . Healthy Lifestyle     Continue to eat heart healthy diet (full of fruits, vegetables, whole grains, lean protein, water--limit salt, fat, and sugar intake) and increase physical activity as tolerated. Continue doing brain stimulating activities (puzzles, reading, adult coloring books, staying active) to keep memory sharp.     . Weight (lb) < 150 lb (68 kg)       Fall Risk Fall Risk  06/05/2018 05/26/2017 05/26/2017 05/10/2016 12/11/2015  Falls in the past year? 0 No No No No    Depression Screen PHQ 2/9 Scores 06/05/2018 05/26/2017 05/26/2017 05/26/2017  PHQ - 2 Score 0 0 - 0  PHQ- 9 Score - 1 - -  Exception Documentation - - Patient refusal -     Cognitive Function Ad8 score reviewed for issues:  Issues making decisions:no  Less interest in hobbies / activities:no  Repeats questions, stories (family complaining):no  Trouble using ordinary gadgets (microwave, computer, phone):no  Forgets the month or year: no  Mismanaging finances: no  Remembering appts:no  Daily problems with thinking and/or memory:no Ad8 score is=0  MMSE - Mini Mental State Exam 05/26/2017  Orientation to time 5  Orientation to Place 5  Registration 3  Attention/ Calculation 5  Recall 3  Language- name 2 objects 2  Language- repeat 1  Language- follow 3 step command 3  Language- read & follow direction 1  Write a sentence 1  Copy design 1  Total score 30        Immunization  History  Administered Date(s) Administered  . Influenza Split 01/01/2011, 11/21/2011, 12/23/2012  . Influenza Whole 12/28/2009  . Influenza, High Dose Seasonal PF 12/07/2017  . Influenza-Unspecified 12/23/2013, 11/02/2014, 12/14/2015  . PPD Test 02/03/2012  . Pneumococcal Conjugate-13 03/07/2014  . Pneumococcal Polysaccharide-23 11/21/2011  . Td 04/11/2005, 05/09/2015  . Zoster 12/10/2010   Screening Tests Health Maintenance  Topic Date Due  . PNA vac Low Risk Adult (2 of 2 - PPSV23) 11/20/2016  . MAMMOGRAM  05/27/2018  . COLONOSCOPY  04/10/2022  . TETANUS/TDAP  05/08/2025  . INFLUENZA VACCINE  Completed  . DEXA SCAN  Completed  . Hepatitis C Screening  Completed      Plan:    Please schedule your next medicare wellness visit with me in 1 yr.  Continue to eat heart healthy diet (full of fruits, vegetables, whole grains, lean protein, water--limit salt, fat, and sugar intake) and increase physical activity as tolerated.  Continue doing brain stimulating activities (puzzles, reading, adult coloring books, staying active) to keep memory sharp.   Bring a copy of your living will and/or healthcare power of attorney to your next office visit.  You now have an active order for bone density and mammogram. Someone will call you to schedule   I have personally reviewed and noted the following in the patient's chart:   . Medical and social history . Use of alcohol, tobacco or illicit drugs  . Current medications and supplements . Functional ability and status . Nutritional status . Physical activity . Advanced directives . List of other physicians . Hospitalizations, surgeries, and ER visits in previous 12 months . Vitals . Screenings to include cognitive, depression, and falls . Referrals and appointments  In addition, I have reviewed and discussed with patient certain preventive protocols, quality metrics, and best practice recommendations. A written personalized care plan  for preventive services as well as general preventive health recommendations were provided  to patient.     Mady Haagensen [place], [place]  06/05/2018   Willow Ora, MD

## 2018-06-05 ENCOUNTER — Encounter: Payer: Self-pay | Admitting: *Deleted

## 2018-06-05 ENCOUNTER — Other Ambulatory Visit: Payer: Self-pay

## 2018-06-05 ENCOUNTER — Ambulatory Visit (INDEPENDENT_AMBULATORY_CARE_PROVIDER_SITE_OTHER): Payer: Medicare Other | Admitting: *Deleted

## 2018-06-05 DIAGNOSIS — Z780 Asymptomatic menopausal state: Secondary | ICD-10-CM | POA: Diagnosis not present

## 2018-06-05 DIAGNOSIS — Z0000 Encounter for general adult medical examination without abnormal findings: Secondary | ICD-10-CM | POA: Diagnosis not present

## 2018-06-05 NOTE — Patient Instructions (Signed)
Please schedule your next medicare wellness visit with me in 1 yr.  Continue to eat heart healthy diet (full of fruits, vegetables, whole grains, lean protein, water--limit salt, fat, and sugar intake) and increase physical activity as tolerated.  Continue doing brain stimulating activities (puzzles, reading, adult coloring books, staying active) to keep memory sharp.   Bring a copy of your living will and/or healthcare power of attorney to your next office visit.  You now have an active order for bone density and mammogram. Someone will call you to schedule    Alice Howard , Thank you for taking time to come for your Medicare Wellness Visit. I appreciate your ongoing commitment to your health goals. Please review the following plan we discussed and let me know if I can assist you in the future.   These are the goals we discussed: Goals    . Grow her skin care    . Healthy Lifestyle     Continue to eat heart healthy diet (full of fruits, vegetables, whole grains, lean protein, water--limit salt, fat, and sugar intake) and increase physical activity as tolerated. Continue doing brain stimulating activities (puzzles, reading, adult coloring books, staying active) to keep memory sharp.     . Weight (lb) < 150 lb (68 kg)       This is a list of the screening recommended for you and due dates:  Health Maintenance  Topic Date Due  . Pneumonia vaccines (2 of 2 - PPSV23) 11/20/2016  . Mammogram  05/27/2018  . Colon Cancer Screening  04/10/2022  . Tetanus Vaccine  05/08/2025  . Flu Shot  Completed  . DEXA scan (bone density measurement)  Completed  .  Hepatitis C: One time screening is recommended by Center for Disease Control  (CDC) for  adults born from [birth year] through 1963.   Completed    Health Maintenance After Age 94 After age 17, you are at a higher risk for certain long-term diseases and infections as well as injuries from falls. Falls are a major cause of broken bones and head  injuries in people who are older than age 31. Getting regular preventive care can help to keep you healthy and well. Preventive care includes getting regular testing and making lifestyle changes as recommended by your health care provider. Talk with your health care provider about:  Which screenings and tests you should have. A screening is a test that checks for a disease when you have no symptoms.  A diet and exercise plan that is right for you. What should I know about screenings and tests to prevent falls? Screening and testing are the best ways to find a health problem early. Early diagnosis and treatment give you the best chance of managing medical conditions that are common after age 71. Certain conditions and lifestyle choices may make you more likely to have a fall. Your health care provider may recommend:  Regular vision checks. Poor vision and conditions such as cataracts can make you more likely to have a fall. If you wear glasses, make sure to get your prescription updated if your vision changes.  Medicine review. Work with your health care provider to regularly review all of the medicines you are taking, including over-the-counter medicines. Ask your health care provider about any side effects that may make you more likely to have a fall. Tell your health care provider if any medicines that you take make you feel dizzy or sleepy.  Osteoporosis screening. Osteoporosis is a condition that  causes the bones to get weaker. This can make the bones weak and cause them to break more easily.  Blood pressure screening. Blood pressure changes and medicines to control blood pressure can make you feel dizzy.  Strength and balance checks. Your health care provider may recommend certain tests to check your strength and balance while standing, walking, or changing positions.  Foot health exam. Foot pain and numbness, as well as not wearing proper footwear, can make you more likely to have a fall.   Depression screening. You may be more likely to have a fall if you have a fear of falling, feel emotionally low, or feel unable to do activities that you used to do.  Alcohol use screening. Using too much alcohol can affect your balance and may make you more likely to have a fall. What actions can I take to lower my risk of falls? General instructions  Talk with your health care provider about your risks for falling. Tell your health care provider if: ? You fall. Be sure to tell your health care provider about all falls, even ones that seem minor. ? You feel dizzy, sleepy, or off-balance.  Take over-the-counter and prescription medicines only as told by your health care provider. These include any supplements.  Eat a healthy diet and maintain a healthy weight. A healthy diet includes low-fat dairy products, low-fat (lean) meats, and fiber from whole grains, beans, and lots of fruits and vegetables. Home safety  Remove any tripping hazards, such as rugs, cords, and clutter.  Install safety equipment such as grab bars in bathrooms and safety rails on stairs.  Keep rooms and walkways well-lit. Activity   Follow a regular exercise program to stay fit. This will help you maintain your balance. Ask your health care provider what types of exercise are appropriate for you.  If you need a cane or walker, use it as recommended by your health care provider.  Wear supportive shoes that have nonskid soles. Lifestyle  Do not drink alcohol if your health care provider tells you not to drink.  If you drink alcohol, limit how much you have: ? 0-1 drink a day for women. ? 0-2 drinks a day for men.  Be aware of how much alcohol is in your drink. In the [place] one drink equals one typical bottle of beer (12 oz), one-half glass of wine (5 oz), or one shot of hard liquor (1 oz).  Do not use any products that contain nicotine or tobacco, such as cigarettes and e-cigarettes. If you need help quitting,  ask your health care provider. Summary  Having a healthy lifestyle and getting preventive care can help to protect your health and wellness after age 53.  Screening and testing are the best way to find a health problem early and help you avoid having a fall. Early diagnosis and treatment give you the best chance for managing medical conditions that are more common for people who are older than age 67.  Falls are a major cause of broken bones and head injuries in people who are older than age 65. Take precautions to prevent a fall at home.  Work with your health care provider to learn what changes you can make to improve your health and wellness and to prevent falls. This information is not intended to replace advice given to you by your health care provider. Make sure you discuss any questions you have with your health care provider. Document Released: 01/08/2017 Document Revised: 01/08/2017 Document Reviewed:  01/08/2017 Elsevier Interactive Patient Education  [employer].

## 2018-06-11 ENCOUNTER — Ambulatory Visit: Payer: Medicare Other | Admitting: *Deleted

## 2018-07-07 ENCOUNTER — Other Ambulatory Visit: Payer: Self-pay | Admitting: Internal Medicine

## 2018-07-08 ENCOUNTER — Ambulatory Visit (INDEPENDENT_AMBULATORY_CARE_PROVIDER_SITE_OTHER): Payer: Medicare Other | Admitting: Internal Medicine

## 2018-07-08 ENCOUNTER — Encounter: Payer: Self-pay | Admitting: Internal Medicine

## 2018-07-08 ENCOUNTER — Other Ambulatory Visit: Payer: Self-pay

## 2018-07-08 DIAGNOSIS — H9202 Otalgia, left ear: Secondary | ICD-10-CM | POA: Diagnosis not present

## 2018-07-08 DIAGNOSIS — E785 Hyperlipidemia, unspecified: Secondary | ICD-10-CM

## 2018-07-08 DIAGNOSIS — R0789 Other chest pain: Secondary | ICD-10-CM | POA: Diagnosis not present

## 2018-07-08 NOTE — Progress Notes (Signed)
Subjective:    Patient ID: Alice Howard, female    DOB: 19-Dec-1945, 73 y.o.   MRN: [id number]  DOS:  07/08/2018 Type of visit - description: Virtual Visit via Video Note  I connected with@ on 07/09/18 at  1:40 PM EDT by a video enabled telemedicine application and verified that I am speaking with the correct person using two identifiers.   THIS ENCOUNTER IS A VIRTUAL VISIT DUE TO COVID-19 - PATIENT WAS NOT SEEN IN THE OFFICE. PATIENT HAS CONSENTED TO VIRTUAL VISIT / TELEMEDICINE VISIT   Location of patient: home  Location of provider: office  I discussed the limitations of evaluation and management by telemedicine and the availability of in person appointments. The patient expressed understanding and agreed to proceed.  History of Present Illness: Follow-up In general feels well.  She is concerned about left ear pain, happened last week for 2 days.  She felt it was probably wax, she flushed  her ear and overall pain is essentially gone. High cholesterol: Due for labs, good compliance with medication COVID-19: Taking very good care of herself, following all the precautions.  No symptoms, see review of systems.    Review of Systems Denies fever chills No ear discharge or bleeding Had a single episodes of brief chest tightness, with no radiation in the context of emotional stress.  This happened about 4 months ago and has not returned.  She is active takes walks without DOE or chest pain.  Past Medical History:  Diagnosis Date  . Allergic rhinitis   . Baker's cyst    at the L per MRI ordered by ortho aprox 2008 patient thinks is B  . History of colonic polyps    multiple Cscopes,  . Hyperlipidemia   . OA (osteoarthritis)    sees Ortho, 2011 they talked about TKR L  . Osteoporosis   . Vertigo     Past Surgical History:  Procedure Laterality Date  . HEMORRHOID SURGERY  1984  . KNEE SURGERY  1994   due to skiing accident.  Has 2 screws in knee. Gets Euflexxa injections.   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 TUBAL LIGATION      Social History   Socioeconomic History  . Marital status: Married    Spouse name: Not on file  . Number of children: 2  . Years of education: Not on file  . Highest education level: Not on file  Occupational History  . Occupation: Engineer, maintenance (IT)-- retired 2012     Employer: [employer]  Social Needs  . Financial resource strain: Not on file  . Food insecurity:    Worry: Not on file    Inability: Not on file  . Transportation needs:    Medical: Not on file    Non-medical: Not on file  Tobacco Use  . Smoking status: Never Smoker  . Smokeless tobacco: Never Used  Substance and Sexual Activity  . Alcohol use: Yes    Comment: 2 times a week, wine  . Drug use: No  . Sexual activity: Yes  Lifestyle  . Physical activity:    Days per week: Not on file    Minutes per session: Not on file  . Stress: Not on file  Relationships  . Social connections:    Talks on phone: Not on file  Gets together: Not on file    Attends religious service: Not on file    Active member of club or organization: Not on file    Attends meetings of clubs or organizations: Not on file    Relationship status: Not on file  . Intimate partner violence:    Fear of current or ex partner: Not on file    Emotionally abused: Not on file    Physically abused: Not on file    Forced sexual activity: Not on file  Other Topics Concern  . Not on file  Social History Narrative   Works part time, busy w/ [employer]   Household-- pt and husband, healthy marriage   3 g-children     daughter lives in [place]         Allergies as of 07/08/2018   No Known Allergies     Medication List       Accurate as of July 08, 2018 11:59 PM. Always use your most recent med list.         azelastine 0.1 % nasal spray Commonly known as:  ASTELIN Place 2 sprays into both nostrils at bedtime as needed for rhinitis. Use in each nostril as directed   HYALURONIC ACID PO Take by mouth daily.   MULTIVITAMIN PO Take 1 tablet by mouth daily.   NON FORMULARY Take by mouth 2 (two) times daily. 4-5 gin soaked golden raisins BID per ortho   simvastatin 20 MG tablet Commonly known as:  ZOCOR Take 1 tablet (20 mg total) by mouth at bedtime.   TURMERIC PO Take by mouth daily.   VITAMIN D-3 PO Take by mouth daily.           Objective:   Physical Exam There were no vitals taken for this visit. This is our video visit, alert oriented x3, no apparent distress    Assessment     Assessment Hyperlipidemia Osteoporosis T score 2009 -2.6, , 2011 -2.4 , 2013 -2.3,  2017 -2.2 [place] started a holiday 2015 after 5 years. T score 05-2015 -2.2-continue vitamin D MSK-- DJD, R ankle pain  Baker's cyst cyst, per MRI 2008 R groin -distal RLQ pain on-off,   2013 abdomen and pelvic CT   (-). Saw ortho:  not related to DJD of the hip. Carotid US 3-17: < 50%, recheck 2 years   PLAN: High cholesterol: Good med compliance, due for labs, will do on return to the office.  Refill medications as needed Left ear pain: Essentially resolved after a self lavage, recommend to call if the symptoms resurface COVID-19: Doing well with precautions. Chest tightness: Single episode as described above, advised to call me if that resurface, symptom is atypical, now asymptomatic even with physical activity. Aspirin: Not taking regularly, okay to stop. RTC 6 weeks, my staff will call and arrange.     I discussed the assessment and treatment plan with the patient. The patient was provided an opportunity to ask questions and all were answered. The patient agreed with the plan and demonstrated an understanding of the instructions.   The patient was advised to call back or seek an in-person  evaluation if the symptoms worsen or if the condition fails to improve as anticipated.

## 2018-07-09 NOTE — Assessment & Plan Note (Signed)
High cholesterol: Good med compliance, due for labs, will do on return to the office.  Refill medications as needed Left ear pain: Essentially resolved after a self lavage, recommend to call if the symptoms resurface COVID-19: Doing well with precautions. Chest tightness: Single episode as described above, advised to call me if that resurface, symptom is atypical, now asymptomatic even with physical activity. Aspirin: Not taking regularly, okay to stop. RTC 6 weeks, my staff will call and arrange.

## 2018-07-20 ENCOUNTER — Ambulatory Visit (HOSPITAL_BASED_OUTPATIENT_CLINIC_OR_DEPARTMENT_OTHER)
Admission: RE | Admit: 2018-07-20 | Discharge: 2018-07-20 | Disposition: A | Discharge status: Home / Self Care | Payer: Medicare Other | Source: Ambulatory Visit | Attending: Internal Medicine | Admitting: Internal Medicine

## 2018-07-20 ENCOUNTER — Other Ambulatory Visit: Payer: Self-pay

## 2018-07-20 DIAGNOSIS — Z780 Asymptomatic menopausal state: Secondary | ICD-10-CM | POA: Diagnosis not present

## 2018-07-20 DIAGNOSIS — Z1231 Encounter for screening mammogram for malignant neoplasm of breast: Secondary | ICD-10-CM | POA: Diagnosis present

## 2018-08-08 ENCOUNTER — Other Ambulatory Visit: Payer: Self-pay | Admitting: Internal Medicine

## 2018-08-24 ENCOUNTER — Ambulatory Visit: Payer: Medicare Other | Admitting: Internal Medicine

## 2018-08-31 ENCOUNTER — Telehealth: Payer: Self-pay | Admitting: Internal Medicine

## 2018-08-31 ENCOUNTER — Telehealth: Payer: Self-pay | Admitting: *Deleted

## 2018-08-31 DIAGNOSIS — Z20822 Contact with and (suspected) exposure to covid-19: Secondary | ICD-10-CM

## 2018-08-31 NOTE — Telephone Encounter (Signed)
-----   Message from Damita Dunnings, [place] sent at 08/31/2018  3:33 PM EDT ----- Needs testing

## 2018-08-31 NOTE — Telephone Encounter (Signed)
Please advise 

## 2018-08-31 NOTE — Telephone Encounter (Signed)
Okay to send them both to be tested.  Please arrange

## 2018-08-31 NOTE — Telephone Encounter (Signed)
Message sent to testing pool.  

## 2018-08-31 NOTE — Telephone Encounter (Signed)
Pt is requesting covid testing for both she and her husband who is also pt of Dr. Larose Kells.   States her daughter will be visiting from [place] with her 2 young children. States daughter and children will be tested prior to visit. Also states there are high risk family members they will be around and she is concerned. Aware of testing process/ need for referral. Both pt and husband are asymptomatic. Please advise: [phone]  Referral to [facility] for test scheduling if appropriate.

## 2018-08-31 NOTE — Telephone Encounter (Signed)
Pt scheduled for covid testing tomorrow @ [facility] @ 9:30am. Instructions given and order placed

## 2018-09-01 ENCOUNTER — Other Ambulatory Visit: Payer: Self-pay

## 2018-09-01 DIAGNOSIS — Z20822 Contact with and (suspected) exposure to covid-19: Secondary | ICD-10-CM

## 2018-09-03 LAB — NOVEL CORONAVIRUS, NAA: SARS-CoV-2, NAA: NOT DETECTED

## 2018-09-04 ENCOUNTER — Ambulatory Visit: Payer: Medicare Other | Admitting: Internal Medicine

## 2018-09-23 ENCOUNTER — Other Ambulatory Visit: Payer: Self-pay

## 2018-09-23 ENCOUNTER — Ambulatory Visit (INDEPENDENT_AMBULATORY_CARE_PROVIDER_SITE_OTHER): Payer: Medicare Other | Admitting: Internal Medicine

## 2018-09-23 ENCOUNTER — Encounter: Payer: Self-pay | Admitting: Internal Medicine

## 2018-09-23 ENCOUNTER — Telehealth: Payer: Self-pay

## 2018-09-23 VITALS — BP 119/71 | HR 65 | Temp 98.2°F | Resp 16 | Ht 64.0 in | Wt 150.5 lb

## 2018-09-23 DIAGNOSIS — E785 Hyperlipidemia, unspecified: Secondary | ICD-10-CM

## 2018-09-23 DIAGNOSIS — M810 Age-related osteoporosis without current pathological fracture: Secondary | ICD-10-CM | POA: Diagnosis not present

## 2018-09-23 DIAGNOSIS — Z0000 Encounter for general adult medical examination without abnormal findings: Secondary | ICD-10-CM | POA: Diagnosis not present

## 2018-09-23 LAB — CBC WITH DIFFERENTIAL/PLATELET
Basophils Absolute: 0 10*3/uL (ref 0.0–0.1)
Basophils Relative: 0.8 % (ref 0.0–3.0)
Eosinophils Absolute: 0.1 10*3/uL (ref 0.0–0.7)
Eosinophils Relative: 1.8 % (ref 0.0–5.0)
HCT: 40.3 % (ref 36.0–46.0)
Hemoglobin: 13.4 g/dL (ref 12.0–15.0)
Lymphocytes Relative: 25.3 % (ref 12.0–46.0)
Lymphs Abs: 1.4 10*3/uL (ref 0.7–4.0)
MCHC: 33.3 g/dL (ref 30.0–36.0)
MCV: 94.5 fl (ref 78.0–100.0)
Monocytes Absolute: 0.5 10*3/uL (ref 0.1–1.0)
Monocytes Relative: 8.8 % (ref 3.0–12.0)
Neutro Abs: 3.5 10*3/uL (ref 1.4–7.7)
Neutrophils Relative %: 63.3 % (ref 43.0–77.0)
Platelets: 186 10*3/uL (ref 150.0–400.0)
RBC: 4.26 Mil/uL (ref 3.87–5.11)
RDW: 12.2 % (ref 11.5–15.5)
WBC: 5.5 10*3/uL (ref 4.0–10.5)

## 2018-09-23 LAB — LIPID PANEL
Cholesterol: 191 mg/dL (ref 0–200)
HDL: 56 mg/dL (ref >39.00)
LDL Cholesterol: 111 mg/dL — ABNORMAL HIGH (ref 0–99)
NonHDL: 134.73
Total CHOL/HDL Ratio: 3
Triglycerides: 119 mg/dL (ref 0.0–149.0)
VLDL: 23.8 mg/dL (ref 0.0–40.0)

## 2018-09-23 LAB — COMPREHENSIVE METABOLIC PANEL
ALT: 17 U/L (ref 0–35)
AST: 17 U/L (ref 0–37)
Albumin: 4.5 g/dL (ref 3.5–5.2)
Alkaline Phosphatase: 61 U/L (ref 39–117)
BUN: 14 mg/dL (ref 6–23)
CO2: 28 mEq/L (ref 19–32)
Calcium: 9.1 mg/dL (ref 8.4–10.5)
Chloride: 104 mEq/L (ref 96–112)
Creatinine, Ser: 0.65 mg/dL (ref 0.40–1.20)
GFR: 89.77 mL/min (ref >60.00)
Glucose, Bld: 94 mg/dL (ref 70–99)
Potassium: 4.2 mEq/L (ref 3.5–5.1)
Sodium: 141 mEq/L (ref 135–145)
Total Bilirubin: 1.2 mg/dL (ref 0.2–1.2)
Total Protein: 6.6 g/dL (ref 6.0–8.3)

## 2018-09-23 MED ORDER — SHINGRIX 50 MCG/0.5ML IM SUSR: 0.5000 mL | Freq: Once | INTRAMUSCULAR | 1 refills | Status: AC

## 2018-09-23 NOTE — Assessment & Plan Note (Signed)
-  Td 04-2015 -Pneumonia 2013 -prevnar 03-07-14 - zostavax 2012 - shingrex d/w pt, rx printed  - rec flu hot when available  - CCS: 02/14/12 w/ Dr. Amedeo Plenty; 70mm polyp removed from rectum; had a cscope  04/10/2017, see report, next per GI --- has not seen gyn recently    ---Mammogram 05-2018 ---diet: healthy; exercise: yoga, walks, water exercises ---Labs: CMP, FLP, CBC

## 2018-09-23 NOTE — Patient Instructions (Signed)
GO TO THE LAB : Get the blood work     GO TO THE FRONT DESK Schedule your next appointment for a physical exam in 1 year  We will set up your treatment with Prolia, if you do not hear from [place] in 3 weeks please let us know

## 2018-09-23 NOTE — Progress Notes (Signed)
Subjective:    Patient ID: Alice Howard, female    DOB: 1945-11-29, 73 y.o.   MRN: [id number]  DOS:  09/23/2018 Type of visit - description: CPX No major concerns.   Review of Systems Has her usual aches and pains from DJD.  She still remains active.  Other than above, a 14 point review of systems is negative    Past Medical History:  Diagnosis Date  . Allergic rhinitis   . Baker's cyst    at the L per MRI ordered by ortho aprox 2008 patient thinks is B  . History of colonic polyps    multiple Cscopes,  . Hyperlipidemia   . OA (osteoarthritis)    sees Ortho, 2011 they talked about TKR L  . Osteoporosis   . Vertigo     Past Surgical History:  Procedure Laterality Date  . HEMORRHOID SURGERY  1984  . KNEE SURGERY  1994   due to skiing accident.  Has 2 screws in knee. Gets Euflexxa injections.  Marland Kitchen. 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   Procedure: LEFT TOTAL KNEE ARTHROPLASTY WITH HARDWARE REMOVAL;  Surgeon: Loanne DrillingFrank V Aluisio, MD;  Location: [facility];  Service: Orthopedics;  Laterality: Left;  . TUBAL LIGATION     Family History  Problem Relation Age of Onset  . Colon cancer Mother 5555  . Hypertension Mother   . Cancer Father [id number]       "myeloid metaplasia? similar to leukemia"  . Heart attack Other 70       GMx 2   . Breast cancer Neg Hx   . Diabetes Neg Hx     Social History   Socioeconomic History  . Marital status: Married    Spouse name: Not on file  . Number of children: 2  . Years of education: Not on file  . Highest education level: Not on file  Occupational History  . Occupation: Engineer, maintenance (IT)curriculum facilitator-- retired 2012     Employer: [employer]  Social Needs  . Financial resource strain: Not on file  . Food insecurity    Worry: Not on file    Inability: Not on file  . Transportation needs    Medical: Not on file    Non-medical: Not on file  Tobacco Use  . Smoking status: Never Smoker  . Smokeless tobacco: Never Used  Substance and  Sexual Activity  . Alcohol use: Yes    Comment: 2 times a week, wine  . Drug use: No  . Sexual activity: Yes  Lifestyle  . Physical activity    Days per week: Not on file    Minutes per session: Not on file  . Stress: Not on file  Relationships  . Social Musicianconnections    Talks on phone: Not on file    Gets together: Not on file    Attends religious service: Not on file    Active member of club or organization: Not on file    Attends meetings of clubs or organizations: Not on file    Relationship status: Not on file  . Intimate partner violence    Fear of current or ex partner: Not on file    Emotionally abused: Not on file    Physically abused: Not on file    Forced sexual activity: Not on file  Other Topics Concern  . Not on file  Social History Narrative   Household-- pt and husband, healthy marriage   3 g-children     daughter lives  in [place] as of 09/23/2018   No Known Allergies     Medication List       Accurate as of September 23, 2018 11:59 PM. If you have any questions, ask your nurse or doctor.        azelastine 0.1 % nasal spray Commonly known as: ASTELIN Place 2 sprays into both nostrils at bedtime as needed for rhinitis. Use in each nostril as directed   HYALURONIC ACID PO Take by mouth daily.   MULTIVITAMIN PO Take 1 tablet by mouth daily.   NON FORMULARY Take by mouth 2 (two) times daily. 4-5 gin soaked golden raisins BID per ortho   Shingrix injection Generic drug: Zoster Vaccine Adjuvanted Inject 0.5 mLs into the muscle once for 1 dose. Started by: Kathlene November, MD   simvastatin 20 MG tablet Commonly known as: ZOCOR TAKE 1 TABLET(20 MG) BY MOUTH AT BEDTIME   TURMERIC PO Take by mouth daily.   VITAMIN D-3 PO Take by mouth daily.           Objective:   Physical Exam BP 119/71 (BP Location: Left Arm, Patient Position: Sitting, Cuff Size: Small)   Pulse 65   Temp 98.2 F (36.8 C) (Oral)   Resp 16   Ht 5\' 4"   (1.626 m)   Wt 150 lb 8 oz (68.3 kg)   SpO2 100%   BMI 25.83 kg/m  General: Well developed, NAD, BMI noted Neck: No  thyromegaly  HEENT:  Normocephalic . Face symmetric, atraumatic Lungs:  CTA B Normal respiratory effort, no intercostal retractions, no accessory muscle use. Heart: RRR,  no murmur.  No pretibial edema bilaterally  Abdomen:  Not distended, soft, non-tender. No rebound or rigidity.   Skin: Exposed areas without rash. Not pale. Not jaundice Neurologic:  alert & oriented X3.  Speech normal, gait appropriate for age and unassisted Strength symmetric and appropriate for age.  Psych: Cognition and judgment appear intact.  Cooperative with normal attention span and concentration.  Behavior appropriate. No anxious or depressed appearing.      Assessment      Assessment Hyperlipidemia Osteoporosis T score 2009 -2.6, , 2011 -2.4 , 2013 -2.3,  2017 -2.2 [place] started a holiday 2015 after 5 years. T score 05-2015 -2.2-continue vitamin D T score -2.9 07/20/2018 [facility]-- DJD, R ankle pain  Baker's cyst cyst, per MRI 2008 R groin -distal RLQ pain on-off,   2013 abdomen and pelvic CT   (-). Saw ortho:  not related to DJD of the hip. Carotid US 3-17: < 50%, [place] 3/19: see report, no further routine [place]   PLAN: Hyperlipidemia: Continue simvastatin, checking labs.  Doing great with lifestyle Osteoporosis: Last bone density test 07/20/2018, T score -  2.9.  s/p biphosphonates, see above.  She is active, takes vitamin D.  No history of vitamin D deficiency.  Discussed options, agreed to Prolia.  Will set up. RTC 1 year

## 2018-09-23 NOTE — Progress Notes (Signed)
Pre visit review using our clinic review tool, if applicable. No additional management support is needed unless otherwise documented below in the visit note. 

## 2018-09-23 NOTE — Telephone Encounter (Signed)
Needs to be set up for initial Prolia when you return. Thank you.

## 2018-09-24 NOTE — Assessment & Plan Note (Signed)
Hyperlipidemia: Continue simvastatin, checking labs.  Doing great with lifestyle Osteoporosis: Last bone density test 07/20/2018, T score -  2.9.  s/p biphosphonates, see above.  She is active, takes vitamin D.  No history of vitamin D deficiency.  Discussed options, agreed to Prolia.  Will set up. RTC 1 year

## 2018-10-30 ENCOUNTER — Encounter: Payer: Self-pay | Admitting: Internal Medicine

## 2018-10-30 NOTE — Telephone Encounter (Signed)
Nurse visit scheduled for Prolia. 

## 2018-11-04 ENCOUNTER — Encounter: Payer: Self-pay | Admitting: Internal Medicine

## 2018-11-04 ENCOUNTER — Ambulatory Visit: Payer: Medicare Other

## 2018-11-12 ENCOUNTER — Other Ambulatory Visit: Payer: Self-pay | Admitting: Internal Medicine

## 2018-11-22 ENCOUNTER — Encounter: Payer: Self-pay | Admitting: Internal Medicine

## 2018-11-24 ENCOUNTER — Encounter: Payer: Self-pay | Admitting: Internal Medicine

## 2018-12-15 ENCOUNTER — Telehealth: Payer: Self-pay

## 2018-12-15 NOTE — Telephone Encounter (Signed)
Yes she qualifies  for Prolia

## 2018-12-15 NOTE — Telephone Encounter (Signed)
Ok to schedule patient for prolia?

## 2018-12-16 NOTE — Telephone Encounter (Signed)
Sent patient mychart message to schedule prolia injection.

## 2018-12-20 ENCOUNTER — Encounter: Payer: Self-pay | Admitting: Internal Medicine

## 2018-12-22 NOTE — Telephone Encounter (Signed)
Yes patient is good to schedule!

## 2018-12-28 ENCOUNTER — Encounter: Payer: Self-pay | Admitting: Internal Medicine

## 2019-01-19 ENCOUNTER — Other Ambulatory Visit: Payer: Self-pay

## 2019-01-19 ENCOUNTER — Ambulatory Visit (INDEPENDENT_AMBULATORY_CARE_PROVIDER_SITE_OTHER): Payer: Medicare Other | Admitting: *Deleted

## 2019-01-19 DIAGNOSIS — M810 Age-related osteoporosis without current pathological fracture: Secondary | ICD-10-CM | POA: Diagnosis not present

## 2019-01-19 MED ORDER — DENOSUMAB 60 MG/ML ~~LOC~~ SOSY
60.0000 mg | PREFILLED_SYRINGE | Freq: Once | SUBCUTANEOUS | Status: AC
  Administered 2019-01-19: 60 mg via SUBCUTANEOUS

## 2019-01-19 NOTE — Progress Notes (Signed)
Patient here for prolia injection per Dr. Paz.  Injection given in left arm and patient tolerated well. 

## 2019-02-02 ENCOUNTER — Encounter: Payer: Self-pay | Admitting: Internal Medicine

## 2019-03-27 ENCOUNTER — Encounter: Payer: Self-pay | Admitting: Internal Medicine

## 2019-04-02 ENCOUNTER — Ambulatory Visit: Payer: Medicare Other | Attending: Internal Medicine

## 2019-04-02 DIAGNOSIS — Z23 Encounter for immunization: Secondary | ICD-10-CM

## 2019-04-02 NOTE — Progress Notes (Signed)
   Covid-19 Vaccination Clinic  Name:  Alice Howard    MRN: [id number] DOB: 07/22/1945  04/02/2019  Alice Howard was observed post Covid-19 immunization for 15 minutes without incidence. She was provided with Vaccine Information Sheet and instruction to access the V-Safe system.   Alice Howard was instructed to call 911 with any severe reactions post vaccine: Marland Kitchen Difficulty breathing  . Swelling of your face and throat  . A fast heartbeat  . A bad rash all over your body  . Dizziness and weakness    Immunizations Administered    Name Date Dose VIS Date Route   Pfizer COVID-19 Vaccine 04/02/2019  6:33 PM 0.3 mL 02/19/2019 Intramuscular   Manufacturer: [employer], [employer]   Lot: [id number]   NDC: [address]-0

## 2019-04-19 DIAGNOSIS — Z0100 Encounter for examination of eyes and vision without abnormal findings: Secondary | ICD-10-CM | POA: Diagnosis not present

## 2019-04-23 ENCOUNTER — Ambulatory Visit: Payer: BC Managed Care – PPO | Attending: Internal Medicine

## 2019-04-23 DIAGNOSIS — Z23 Encounter for immunization: Secondary | ICD-10-CM

## 2019-04-23 NOTE — Progress Notes (Signed)
   Covid-19 Vaccination Clinic  Name:  ANALESE SOVINE    MRN: [id number] DOB: 1945-06-30  04/23/2019  Ms. Rostad was observed post Covid-19 immunization for 15 minutes without incidence. She was provided with Vaccine Information Sheet and instruction to access the V-Safe system.   Ms. Navarrete was instructed to call 911 with any severe reactions post vaccine: Marland Kitchen Difficulty breathing  . Swelling of your face and throat  . A fast heartbeat  . A bad rash all over your body  . Dizziness and weakness    Immunizations Administered    Name Date Dose VIS Date Route   Pfizer COVID-19 Vaccine 04/23/2019  5:24 PM 0.3 mL 02/19/2019 Intramuscular   Manufacturer: [employer], [employer]   Lot: [id number]   NDC: [address]-0

## 2019-04-27 ENCOUNTER — Ambulatory Visit: Payer: Medicare Other

## 2019-06-07 ENCOUNTER — Ambulatory Visit: Payer: Medicare Other | Admitting: *Deleted

## 2019-06-09 DIAGNOSIS — Z01419 Encounter for gynecological examination (general) (routine) without abnormal findings: Secondary | ICD-10-CM | POA: Diagnosis not present

## 2019-06-09 NOTE — Progress Notes (Signed)
Nurse connected with patient 06/10/19 at  3:15 PM EDT by a telephone enabled telemedicine application and verified that I am speaking with the correct person using two identifiers. Patient stated full name and DOB. Patient gave permission to continue with virtual visit. Patient's location was at home and Nurse's location was at [facility] office.   Subjective:   Alice Howard is a 74 y.o. female who presents for Medicare Annual (Subsequent) preventive examination.  Review of Systems:  Home Safety/Smoke Alarms: Feels safe in home. Smoke alarms in place.  Lives w/ husband in 2 story home.   Female:       Mammo- 07/20/18      Dexa scan-11/26/18        CCS- 04/09/17.    Objective:     Vitals: Unable to assess. This visit is enabled though telemedicine due to Covid 19.   Advanced Directives 06/10/2019 06/05/2018 05/26/2017 05/10/2016 09/22/2012 09/16/2012  Does Patient Have a Medical Advance Directive? Yes Yes Yes Yes Patient does not have advance directive;Patient would not like information Patient has advance directive, copy not in chart  Type of Advance Directive Healthcare Power of [place];Living will Healthcare Power of [place];Living will Healthcare Power of [place];Living will Healthcare Power of [place];Living will - Living will  Does patient want to make changes to medical advance directive? No - Patient declined No - Patient declined No - Patient declined - - -  Copy of Healthcare Power of Attorney in Chart? No - copy requested No - copy requested No - copy requested No - copy requested - Copy requested from family  Pre-existing out of facility DNR order (yellow form or pink MOST form) - - - - - No    Tobacco Social History   Tobacco Use  Smoking Status Never Smoker  Smokeless Tobacco Never Used     Counseling given: Not Answered   Clinical Intake: Pain : No/denies pain     Past Medical History:  Diagnosis Date  . Allergic rhinitis   . Baker's cyst    at the L per MRI  ordered by ortho aprox 2008 patient thinks is B  . History of colonic polyps    multiple Cscopes,  . Hyperlipidemia   . OA (osteoarthritis)    sees Ortho, 2011 they talked about TKR L  . Osteoporosis   . Vertigo    Past Surgical History:  Procedure Laterality Date  . HEMORRHOID SURGERY  1984  . KNEE SURGERY  1994   due to skiing accident.  Has 2 screws in knee. Gets Euflexxa injections.  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 TUBAL LIGATION     Family History  Problem Relation Age of Onset  . Colon cancer Mother 61  . Hypertension Mother   . Cancer Father 92       "myeloid metaplasia? similar to leukemia"  . Heart attack Other 70       GMx 2   . Breast cancer Neg Hx   . Diabetes Neg Hx    Social History   Socioeconomic History  . Marital status: Married    Spouse name: Not on file  . Number of children: 2  . Years of education: Not on file  . Highest education level: Not on file  Occupational History  . Occupation: Engineer, maintenance (IT)-- retired 2012  Employer: [employer]  Tobacco Use  . Smoking status: Never Smoker  . Smokeless tobacco: Never Used  Substance and Sexual Activity  . Alcohol use: Yes    Comment: 2 times a week, wine  . Drug use: No  . Sexual activity: Yes  Other Topics Concern  . Not on file  Social History Narrative   Household-- pt and husband, healthy marriage   37 g-children     daughter lives in [place] Determinants of Health   Financial Resource Strain: [place]   . Difficulty of Paying Living Expenses: Not hard at all  Food Insecurity: No Food Insecurity  . Worried About Charity fundraiser in the Last Year: Never true  . Ran Out of Food in the Last Year: Never true  Transportation Needs: No Transportation Needs  . Lack of Transportation  (Medical): No  . Lack of Transportation (Non-Medical): No  Physical Activity:   . Days of Exercise per Week:   . Minutes of Exercise per Session:   Stress:   . Feeling of Stress :   Social Connections:   . Frequency of Communication with Friends and Family:   . Frequency of Social Gatherings with Friends and Family:   . Attends Religious Services:   . Active Member of Clubs or Organizations:   . Attends Archivist Meetings:   Marland Kitchen Marital Status:     Outpatient Encounter Medications as of 06/10/2019  Medication Sig  . azelastine (ASTELIN) 0.1 % nasal spray Place 2 sprays into both nostrils at bedtime as needed for rhinitis. Use in each nostril as directed  . Cholecalciferol (VITAMIN D-3 PO) Take by mouth daily.  Marland Kitchen Hyaluronic Acid-Vitamin C (HYALURONIC ACID PO) Take by mouth daily.  . Multiple Vitamins-Minerals (MULTIVITAMIN PO) Take 1 tablet by mouth daily.  . NON FORMULARY Take by mouth 2 (two) times daily. 4-5 gin soaked golden raisins BID per ortho  . simvastatin (ZOCOR) 20 MG tablet Take 1 tablet (20 mg total) by mouth at bedtime.  . TURMERIC PO Take by mouth daily.   No facility-administered encounter medications on file as of 06/10/2019.    Activities of Daily Living In your present state of health, do you have any difficulty performing the following activities: 06/10/2019  Hearing? N  Vision? N  Difficulty concentrating or making decisions? N  Walking or climbing stairs? N  Dressing or bathing? N  Doing errands, shopping? N  Preparing Food and eating ? N  Using the Toilet? N  In the past six months, have you accidently leaked urine? N  Do you have problems with loss of bowel control? N  Managing your Medications? N  Managing your Finances? N  Housekeeping or managing your Housekeeping? N  Some recent data might be hidden    Patient Care Team: Colon Branch, MD as PCP - General [place]er, Sherren Mocha, MD as Consulting Physician (Obstetrics and Gynecology) Wylene Simmer,  MD as Consulting Physician (Orthopedic Surgery) Rosary Lively, [facility] (Inactive) as Counselor (Psychology)    Assessment:   This is a routine wellness examination for [place]. Physical assessment deferred to PCP.  Exercise Activities and Dietary recommendations Current Exercise Habits: Structured exercise class, Time (Minutes): 30, Frequency (Times/Week): 3, Weekly Exercise (Minutes/Week): 90, Intensity: Mild, Exercise limited by: None identified Diet (meal preparation, eat out, water intake, caffeinated beverages, dairy products, fruits and vegetables): in general, a "healthy" diet  , well balanced   Goals    .  Grow her skin care    . Healthy Lifestyle     Continue to eat heart healthy diet (full of fruits, vegetables, whole grains, lean protein, water--limit salt, fat, and sugar intake) and increase physical activity as tolerated. Continue doing brain stimulating activities (puzzles, reading, adult coloring books, staying active) to keep memory sharp.     . Weight (lb) < 150 lb (68 kg)       Fall Risk Fall Risk  06/10/2019 09/23/2018 06/05/2018 05/26/2017 05/26/2017  Falls in the past year? 0 0 0 No No  Number falls in past yr: 0 0 - - -  Injury with Fall? 0 - - - -  Follow up Education provided;Falls prevention discussed - - - -  Depression Screen PHQ 2/9 Scores 06/10/2019 06/05/2018 05/26/2017 05/26/2017  PHQ - 2 Score 0 0 0 -  PHQ- 9 Score - - 1 -  Exception Documentation - - - Patient refusal     Cognitive Function Ad8 score reviewed for issues:  Issues making decisions:no  Less interest in hobbies / activities:no  Repeats questions, stories (family complaining):no  Trouble using ordinary gadgets (microwave, computer, phone):no  Forgets the month or year: no  Mismanaging finances: no  Remembering appts:no  Daily problems with thinking and/or memory: no Ad8 score is=0     MMSE - Mini Mental State Exam 05/26/2017  Orientation to time 5  Orientation to Place 5    Registration 3  Attention/ Calculation 5  Recall 3  Language- name 2 objects 2  Language- repeat 1  Language- follow 3 step command 3  Language- read & follow direction 1  Write a sentence 1  Copy design 1  Total score 30        Immunization History  Administered Date(s) Administered  . Influenza Split 01/01/2011, 11/21/2011, 12/23/2012  . Influenza Whole 12/28/2009  . Influenza, High Dose Seasonal PF 12/07/2017, 12/06/2018  . Influenza-Unspecified 12/23/2013, 11/02/2014, 12/14/2015  . PFIZER SARS-COV-2 Vaccination 04/02/2019, 04/23/2019  . PPD Test 02/03/2012  . Pneumococcal Conjugate-13 03/07/2014  . Pneumococcal Polysaccharide-23 11/21/2011  . Td 04/11/2005, 05/09/2015  . Zoster 12/10/2010  . Zoster Recombinat (Shingrix) 11/02/2018   Screening Tests Health Maintenance  Topic Date Due  . PNA vac Low Risk Adult (2 of 2 - PPSV23) 11/20/2016  . MAMMOGRAM  07/20/2019  . INFLUENZA VACCINE  10/10/2019  . COLONOSCOPY  04/10/2022  . TETANUS/TDAP  05/08/2025  . DEXA SCAN  Completed  . Hepatitis C Screening  Completed      Plan:   See you next year!  Continue to eat heart healthy diet (full of fruits, vegetables, whole grains, lean protein, water--limit salt, fat, and sugar intake) and increase physical activity as tolerated.  Continue doing brain stimulating activities (puzzles, reading, adult coloring books, staying active) to keep memory sharp.   Bring a copy of your living will and/or healthcare power of attorney to your next office visit.    I have personally reviewed and noted the following in the patient's chart:   . Medical and social history . Use of alcohol, tobacco or illicit drugs  . Current medications and supplements . Functional ability and status . Nutritional status . Physical activity . Advanced directives . List of other physicians . Hospitalizations, surgeries, and ER visits in previous 12 months . Vitals . Screenings to include cognitive,  depression, and falls . Referrals and appointments  In addition, I have reviewed and discussed with patient certain preventive protocols, quality metrics, and best practice recommendations.  A written personalized care plan for preventive services as well as general preventive health recommendations were provided to patient.     [place]on Gully, [place]  06/10/2019

## 2019-06-10 ENCOUNTER — Encounter: Payer: Self-pay | Admitting: *Deleted

## 2019-06-10 ENCOUNTER — Ambulatory Visit (INDEPENDENT_AMBULATORY_CARE_PROVIDER_SITE_OTHER): Payer: BC Managed Care – PPO | Admitting: *Deleted

## 2019-06-10 ENCOUNTER — Other Ambulatory Visit: Payer: Self-pay

## 2019-06-10 DIAGNOSIS — Z0000 Encounter for general adult medical examination without abnormal findings: Secondary | ICD-10-CM

## 2019-06-10 NOTE — Patient Instructions (Signed)
See you next year!  Continue to eat heart healthy diet (full of fruits, vegetables, whole grains, lean protein, water--limit salt, fat, and sugar intake) and increase physical activity as tolerated.  Continue doing brain stimulating activities (puzzles, reading, adult coloring books, staying active) to keep memory sharp.   Bring a copy of your living will and/or healthcare power of attorney to your next office visit.    Alice Howard , Thank you for taking time to come for your Medicare Wellness Visit. I appreciate your ongoing commitment to your health goals. Please review the following plan we discussed and let me know if I can assist you in the future.   These are the goals we discussed: Goals    . Grow her skin care    . Healthy Lifestyle     Continue to eat heart healthy diet (full of fruits, vegetables, whole grains, lean protein, water--limit salt, fat, and sugar intake) and increase physical activity as tolerated. Continue doing brain stimulating activities (puzzles, reading, adult coloring books, staying active) to keep memory sharp.     . Weight (lb) < 150 lb (68 kg)       This is a list of the screening recommended for you and due dates:  Health Maintenance  Topic Date Due  . Pneumonia vaccines (2 of 2 - PPSV23) 11/20/2016  . Mammogram  07/20/2019  . Flu Shot  10/10/2019  . Colon Cancer Screening  04/10/2022  . Tetanus Vaccine  05/08/2025  . DEXA scan (bone density measurement)  Completed  .  Hepatitis C: One time screening is recommended by Center for Disease Control  (CDC) for  adults born from [birth year] through 1963.   Completed    Preventive Care 39 Years and Older, Female Preventive care refers to lifestyle choices and visits with your health care provider that can promote health and wellness. This includes:  A yearly physical exam. This is also called an annual well check.  Regular dental and eye exams.  Immunizations.  Screening for certain  conditions.  Healthy lifestyle choices, such as diet and exercise. What can I expect for my preventive care visit? Physical exam Your health care provider will check:  Height and weight. These may be used to calculate body mass index (BMI), which is a measurement that tells if you are at a healthy weight.  Heart rate and blood pressure.  Your skin for abnormal spots. Counseling Your health care provider may ask you questions about:  Alcohol, tobacco, and drug use.  Emotional well-being.  Home and relationship well-being.  Sexual activity.  Eating habits.  History of falls.  Memory and ability to understand (cognition).  Work and work Statistician.  Pregnancy and menstrual history. What immunizations do I need?  Influenza (flu) vaccine  This is recommended every year. Tetanus, diphtheria, and pertussis (Tdap) vaccine  You may need a Td booster every 10 years. Varicella (chickenpox) vaccine  You may need this vaccine if you have not already been vaccinated. Zoster (shingles) vaccine  You may need this after age 44. Pneumococcal conjugate (PCV13) vaccine  One dose is recommended after age 27. Pneumococcal polysaccharide (PPSV23) vaccine  One dose is recommended after age 76. Measles, mumps, and rubella (MMR) vaccine  You may need at least one dose of MMR if you were born in 1957 or later. You may also need a second dose. Meningococcal conjugate (MenACWY) vaccine  You may need this if you have certain conditions. Hepatitis A vaccine  You may need this  if you have certain conditions or if you travel or work in places where you may be exposed to hepatitis A. Hepatitis B vaccine  You may need this if you have certain conditions or if you travel or work in places where you may be exposed to hepatitis B. Haemophilus influenzae type b (Hib) vaccine  You may need this if you have certain conditions. You may receive vaccines as individual doses or as more than  one vaccine together in one shot (combination vaccines). Talk with your health care provider about the risks and benefits of combination vaccines. What tests do I need? Blood tests  Lipid and cholesterol levels. These may be checked every 5 years, or more frequently depending on your overall health.  Hepatitis C test.  Hepatitis B test. Screening  Lung cancer screening. You may have this screening every year starting at age 72 if you have a 30-pack-year history of smoking and currently smoke or have quit within the past 15 years.  Colorectal cancer screening. All adults should have this screening starting at age 3 and continuing until age 69. Your health care provider may recommend screening at age 67 if you are at increased risk. You will have tests every 1-10 years, depending on your results and the type of screening test.  Diabetes screening. This is done by checking your blood sugar (glucose) after you have not eaten for a while (fasting). You may have this done every 1-3 years.  Mammogram. This may be done every 1-2 years. Talk with your health care provider about how often you should have regular mammograms.  BRCA-related cancer screening. This may be done if you have a family history of breast, ovarian, tubal, or peritoneal cancers. Other tests  Sexually transmitted disease (STD) testing.  Bone density scan. This is done to screen for osteoporosis. You may have this done starting at age 20. Follow these instructions at home: Eating and drinking  Eat a diet that includes fresh fruits and vegetables, whole grains, lean protein, and low-fat dairy products. Limit your intake of foods with high amounts of sugar, saturated fats, and salt.  Take vitamin and mineral supplements as recommended by your health care provider.  Do not drink alcohol if your health care provider tells you not to drink.  If you drink alcohol: ? Limit how much you have to 0-1 drink a day. ? Be aware of how  much alcohol is in your drink. In the [place] one drink equals one 12 oz bottle of beer (355 mL), one 5 oz glass of wine (148 mL), or one 1 oz glass of hard liquor (44 mL). Lifestyle  Take daily care of your teeth and gums.  Stay active. Exercise for at least 30 minutes on 5 or more days each week.  Do not use any products that contain nicotine or tobacco, such as cigarettes, e-cigarettes, and chewing tobacco. If you need help quitting, ask your health care provider.  If you are sexually active, practice safe sex. Use a condom or other form of protection in order to prevent STIs (sexually transmitted infections).  Talk with your health care provider about taking a low-dose aspirin or statin. What's next?  Go to your health care provider once a year for a well check visit.  Ask your health care provider how often you should have your eyes and teeth checked.  Stay up to date on all vaccines. This information is not intended to replace advice given to you by your health care  provider. Make sure you discuss any questions you have with your health care provider. Document Revised: 02/19/2018 Document Reviewed: 02/19/2018 Elsevier Patient Education  2020 [employer].

## 2019-06-14 DIAGNOSIS — Z0120 Encounter for dental examination and cleaning without abnormal findings: Secondary | ICD-10-CM | POA: Diagnosis not present

## 2019-06-24 ENCOUNTER — Encounter: Payer: Self-pay | Admitting: Internal Medicine

## 2019-06-29 ENCOUNTER — Other Ambulatory Visit (HOSPITAL_BASED_OUTPATIENT_CLINIC_OR_DEPARTMENT_OTHER): Payer: Self-pay | Admitting: Internal Medicine

## 2019-06-29 DIAGNOSIS — Z1231 Encounter for screening mammogram for malignant neoplasm of breast: Secondary | ICD-10-CM

## 2019-07-26 ENCOUNTER — Ambulatory Visit (HOSPITAL_BASED_OUTPATIENT_CLINIC_OR_DEPARTMENT_OTHER): Payer: BC Managed Care – PPO

## 2019-08-02 ENCOUNTER — Ambulatory Visit (HOSPITAL_BASED_OUTPATIENT_CLINIC_OR_DEPARTMENT_OTHER): Payer: BC Managed Care – PPO

## 2019-08-03 ENCOUNTER — Other Ambulatory Visit: Payer: Self-pay

## 2019-08-03 ENCOUNTER — Ambulatory Visit (HOSPITAL_BASED_OUTPATIENT_CLINIC_OR_DEPARTMENT_OTHER)
Admission: RE | Admit: 2019-08-03 | Discharge: 2019-08-03 | Disposition: A | Discharge status: Home / Self Care | Payer: Medicare Other | Source: Ambulatory Visit | Attending: Internal Medicine | Admitting: Internal Medicine

## 2019-08-03 ENCOUNTER — Encounter (HOSPITAL_BASED_OUTPATIENT_CLINIC_OR_DEPARTMENT_OTHER): Payer: Self-pay

## 2019-08-03 DIAGNOSIS — Z1231 Encounter for screening mammogram for malignant neoplasm of breast: Secondary | ICD-10-CM | POA: Insufficient documentation

## 2019-08-16 ENCOUNTER — Encounter: Payer: Self-pay | Admitting: Internal Medicine

## 2019-08-16 ENCOUNTER — Ambulatory Visit (INDEPENDENT_AMBULATORY_CARE_PROVIDER_SITE_OTHER): Payer: BC Managed Care – PPO | Admitting: Internal Medicine

## 2019-08-16 ENCOUNTER — Other Ambulatory Visit: Payer: Self-pay

## 2019-08-16 VITALS — BP 116/77 | HR 79 | Temp 96.0°F | Resp 18 | Ht 64.0 in | Wt 158.0 lb

## 2019-08-16 DIAGNOSIS — L247 Irritant contact dermatitis due to plants, except food: Secondary | ICD-10-CM | POA: Diagnosis not present

## 2019-08-16 MED ORDER — PREDNISONE 10 MG PO TABS
ORAL_TABLET | ORAL | 0 refills | Status: DC
Start: 2019-08-16 — End: 2019-09-24

## 2019-08-16 MED ORDER — TRIAMCINOLONE ACETONIDE 0.1 % EX LOTN
1.0000 | TOPICAL_LOTION | Freq: Three times a day (TID) | CUTANEOUS | 0 refills | Status: DC
Start: 2019-08-16 — End: 2019-09-24

## 2019-08-16 NOTE — Progress Notes (Signed)
Subjective:    Patient ID: Alice Howard, female    DOB: Jul 10, 1945, 74 y.o.   MRN: [id number]  DOS:  08/16/2019 Type of visit - description: Acute Developed a rash 5 days ago, very pruritic. Started at the left upper extremity, then went to her chest and subsequently to  the abdomen. Admits that she has been doing some light work on her yard but does not recall any specific poison ivy or poison oak contacts   Review of Systems Denies fever chills No headache No nausea or vomiting  Past Medical History:  Diagnosis Date  . Allergic rhinitis   . Baker's cyst    at the L per MRI ordered by ortho aprox 2008 patient thinks is B  . History of colonic polyps    multiple Cscopes,  . Hyperlipidemia   . OA (osteoarthritis)    sees Ortho, 2011 they talked about TKR L  . Osteoporosis   . Vertigo     Past Surgical History:  Procedure Laterality Date  . [place]  . KNEE SURGERY  1994   due to skiing accident.  Has 2 screws in knee. Gets Euflexxa injections.  Marland Kitchen 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   Procedure: LEFT TOTAL KNEE ARTHROPLASTY WITH HARDWARE REMOVAL;  Surgeon: Gearlean Alf, MD;  Location: [facility];  Service: Orthopedics;  Laterality: Left;  . TUBAL LIGATION      Allergies as of 08/16/2019   No Known Allergies     Medication List       Accurate as of August 16, 2019  2:55 PM. If you have any questions, ask your nurse or doctor.        azelastine 0.1 % nasal spray Commonly known as: ASTELIN Place 2 sprays into both nostrils at bedtime as needed for rhinitis. Use in each nostril as directed   HYALURONIC ACID PO Take by mouth daily.   MULTIVITAMIN PO Take 1 tablet by mouth daily.   NON FORMULARY Take by mouth 2 (two) times daily. 4-5 gin soaked golden raisins BID per ortho   simvastatin 20 MG tablet Commonly known as: ZOCOR Take 1 tablet (20 mg total) by mouth at bedtime.   TURMERIC PO Take by mouth daily.   VITAMIN D-3 PO Take by mouth  daily.          Objective:   Physical Exam Skin:        BP 116/77 (BP Location: Right Arm, Patient Position: Sitting, Cuff Size: Small)   Pulse 79   Temp (!) 96 F (35.6 C) (Temporal)   Resp 18   Ht 5\' 4"  (1.626 m)   Wt 158 lb (71.7 kg)   SpO2 99%   BMI 27.12 kg/m  Exam: Assisted by my nurse General:   Well developed, NAD, BMI noted. HEENT:  Normocephalic . Face symmetric, atraumatic Skin: See graphic Neurologic:  alert & oriented X3.  Speech normal, gait appropriate for age and unassisted Psych--  Cognition and judgment appear intact.  Cooperative with normal attention span and concentration.  Behavior appropriate. No anxious or depressed appearing.      Assessment    Assessment Hyperlipidemia Osteoporosis T score 2009 -2.6, , 2011 -2.4 , 2013 -2.3,  2017 -2.2 [place] started a holiday 2015 after 5 years. T score 05-2015 -2.2-continue vitamin D T score -2.9 07/20/2018 [facility]-- DJD, R ankle pain  Baker's cyst cyst, per MRI 2008 R groin -distal RLQ pain on-off,   2013 abdomen and pelvic CT   (-).  Saw ortho:  not related to DJD of the hip. Carotid US 3-17: < 50%, [place] 3/19: see report, no further routine [place]   PLAN: Contact dermatitis Suspect contact dermatitis, doubt shingles.  Patient concerned about tick bites but that is unlikely to be related. Plan: Low-dose prednisone for few days, see prescription Benadryl as needed, reports she does not get sleepy on it. Kenalog lotion Call if not better   This visit occurred during the SARS-CoV-2 public health emergency.  Safety protocols were in place, including screening questions prior to the visit, additional usage of staff PPE, and extensive cleaning of exam room while observing appropriate contact time as indicated for disinfecting solutions.

## 2019-08-16 NOTE — Patient Instructions (Signed)
Take prednisone as prescribed for 5 days  Use a lotion I sent up to 3 times a day as needed  Continue using Benadryl over-the-counter 2 or 3 times a day as needed.  Call if you are not improving in the next few days or if you get worse

## 2019-08-16 NOTE — Progress Notes (Signed)
Pre visit review using our clinic review tool, if applicable. No additional management support is needed unless otherwise documented below in the visit note. 

## 2019-08-17 NOTE — Assessment & Plan Note (Signed)
Contact dermatitis Suspect contact dermatitis, doubt shingles.  Patient concerned about tick bites but that is unlikely to be related. Plan: Low-dose prednisone for few days, see prescription Benadryl as needed, reports she does not get sleepy on it. Kenalog lotion Call if not better

## 2019-09-24 ENCOUNTER — Encounter: Payer: Self-pay | Admitting: Internal Medicine

## 2019-09-24 ENCOUNTER — Other Ambulatory Visit: Payer: Self-pay

## 2019-09-24 ENCOUNTER — Ambulatory Visit (INDEPENDENT_AMBULATORY_CARE_PROVIDER_SITE_OTHER): Payer: BC Managed Care – PPO | Admitting: Internal Medicine

## 2019-09-24 VITALS — BP 117/71 | HR 67 | Temp 98.2°F | Resp 18 | Ht 64.0 in | Wt 156.1 lb

## 2019-09-24 DIAGNOSIS — E785 Hyperlipidemia, unspecified: Secondary | ICD-10-CM

## 2019-09-24 DIAGNOSIS — Z0000 Encounter for general adult medical examination without abnormal findings: Secondary | ICD-10-CM | POA: Diagnosis not present

## 2019-09-24 DIAGNOSIS — M810 Age-related osteoporosis without current pathological fracture: Secondary | ICD-10-CM

## 2019-09-24 LAB — COMPREHENSIVE METABOLIC PANEL
ALT: 17 U/L (ref 0–35)
AST: 20 U/L (ref 0–37)
Albumin: 4.4 g/dL (ref 3.5–5.2)
Alkaline Phosphatase: 46 U/L (ref 39–117)
BUN: 15 mg/dL (ref 6–23)
CO2: 29 mEq/L (ref 19–32)
Calcium: 9.1 mg/dL (ref 8.4–10.5)
Chloride: 103 mEq/L (ref 96–112)
Creatinine, Ser: 0.68 mg/dL (ref 0.40–1.20)
GFR: 84.98 mL/min (ref >60.00)
Glucose, Bld: 105 mg/dL — ABNORMAL HIGH (ref 70–99)
Potassium: 4.1 mEq/L (ref 3.5–5.1)
Sodium: 139 mEq/L (ref 135–145)
Total Bilirubin: 1 mg/dL (ref 0.2–1.2)
Total Protein: 6.7 g/dL (ref 6.0–8.3)

## 2019-09-24 LAB — CBC WITH DIFFERENTIAL/PLATELET
Basophils Absolute: 0 10*3/uL (ref 0.0–0.1)
Basophils Relative: 0.8 % (ref 0.0–3.0)
Eosinophils Absolute: 0.1 10*3/uL (ref 0.0–0.7)
Eosinophils Relative: 2.1 % (ref 0.0–5.0)
HCT: 38.9 % (ref 36.0–46.0)
Hemoglobin: 13.2 g/dL (ref 12.0–15.0)
Lymphocytes Relative: 27.1 % (ref 12.0–46.0)
Lymphs Abs: 1.5 10*3/uL (ref 0.7–4.0)
MCHC: 33.9 g/dL (ref 30.0–36.0)
MCV: 95.1 fl (ref 78.0–100.0)
Monocytes Absolute: 0.4 10*3/uL (ref 0.1–1.0)
Monocytes Relative: 8 % (ref 3.0–12.0)
Neutro Abs: 3.3 10*3/uL (ref 1.4–7.7)
Neutrophils Relative %: 62 % (ref 43.0–77.0)
Platelets: 170 10*3/uL (ref 150.0–400.0)
RBC: 4.09 Mil/uL (ref 3.87–5.11)
RDW: 11.7 % (ref 11.5–15.5)
WBC: 5.4 10*3/uL (ref 4.0–10.5)

## 2019-09-24 LAB — LIPID PANEL
Cholesterol: 181 mg/dL (ref 0–200)
HDL: 56.3 mg/dL (ref >39.00)
LDL Cholesterol: 109 mg/dL — ABNORMAL HIGH (ref 0–99)
NonHDL: 125.1
Total CHOL/HDL Ratio: 3
Triglycerides: 79 mg/dL (ref 0.0–149.0)
VLDL: 15.8 mg/dL (ref 0.0–40.0)

## 2019-09-24 LAB — TSH: TSH: 1.36 u[IU]/mL (ref 0.35–4.50)

## 2019-09-24 LAB — VITAMIN D 25 HYDROXY (VIT D DEFICIENCY, FRACTURES): VITD: 63.41 ng/mL (ref 30.00–100.00)

## 2019-09-24 NOTE — Progress Notes (Signed)
Pre visit review using our clinic review tool, if applicable. No additional management support is needed unless otherwise documented below in the visit note. 

## 2019-09-24 NOTE — Patient Instructions (Addendum)
Get a flu shot this fall  GO TO THE LAB : Get the blood work     GO TO THE FRONT DESK, PLEASE SCHEDULE YOUR APPOINTMENTS Come back for checkup in 6 to 8 months    LEARN ABOUT CALCIUM AND VITAMIN D  Calcium and Vitamin D are important to help keep your bones healthy and prevent osteoporosis. Healthy calcium and Vitamin D levels can be reached by increasing calcium and vitamin D in your diet, or by taking supplements over the counter.  The current recommendations are as follows:  Increasing calcium and vitamin D is generally well tolerated in most people. Some people may get constipated (with CALCIUM).   RECCOMENDATIONS -Postmenopausal women:    1200mg  calcium and 800 IU (20 micrograms) vitamin D daily  -Premenopausal women:    1000mg  calcium and 600 IU (15 micrograms) Vitamin D daily   -Men (<70) with osteoporosis:   1000mg  calcium and 600 IU (15 micrograms) Vitamin D daily  -Men over 70:   800 IU Vitamin D daily  Below are some examples of foods high in calcium and vitamin D:  Foods with Vitamin D FOOD Vitamin D Amount  3 oz Salmon 380-570 IU  8 oz Milk (nonfat, reduced fat, and whole) 100 IU  6 oz Yogurt, fortified with vitamin D 80 IU  8 oz Orange Juice, fortified with vitamin D 100 IU  1 Egg 25 IU   Foods with Calcium FOOD Calcium Amount  1 cup of milk 300 mg  1 cup of yogurt 450 mg  1 oz cheese 200 mg  1 cup of spinach 240 mg  8 oz orange juice, fortified with calcium 300 mg

## 2019-09-25 ENCOUNTER — Encounter: Payer: Self-pay | Admitting: Internal Medicine

## 2019-09-25 NOTE — Assessment & Plan Note (Signed)
-  Td 04-2015 -Pneumonia 2013 -prevnar 03-07-14 - zostavax 2012 - s/p shingrex  - s/p covid vaccines -rec flu hot q year  - CCS: 02/14/12 w/ Dr. Madilyn Fireman; 57mm polyp removed from rectum; cscope 04/10/2017, see report, next per GI --- saw gyn few months ago, was rec to RTC if so desire, she is thinking about going back in 3 years ---MMG 07/2019 ---diet: healthy; exercise: yoga, walks, water exercises ---Labs:  CMP, FLP, CBC, TSH, vitamin D

## 2019-09-25 NOTE — Progress Notes (Addendum)
Subjective:    Patient ID: Alice Howard, female    DOB: [birth year], 74 y.o.   MRN: [id number]  DOS:  09/24/2019 Type of visit - description: CPX Here for CPX In general feeling well Did have aches and pains after the Covid vaccination that lasted 3 weeks, back to normal. We also d/w pt Prolia side effects.   Review of Systems  Other than above, a 14 point review of systems is negative    Past Medical History:  Diagnosis Date  . Allergic rhinitis   . Baker's cyst    at the L per MRI ordered by ortho aprox 2008 patient thinks is B  . History of colonic polyps    multiple Cscopes,  . Hyperlipidemia   . OA (osteoarthritis)    sees Ortho, 2011 they talked about TKR L  . Osteoporosis   . Vertigo     Past Surgical History:  Procedure Laterality Date  . HEMORRHOID SURGERY  1984  . KNEE SURGERY  1994   due to skiing accident.  Has 2 screws in knee. Gets Euflexxa injections.  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 TUBAL LIGATION      Allergies as of 09/24/2019   No Known Allergies     Medication List       Accurate as of September 24, 2019 11:59 PM. If you have any questions, ask your nurse or doctor.        STOP taking these medications   HYALURONIC ACID PO Stopped by: Willow Ora, MD   predniSONE 10 MG tablet Commonly known as: DELTASONE Stopped by: Willow Ora, MD   triamcinolone lotion 0.1 % Commonly known as: KENALOG Stopped by: Willow Ora, MD     TAKE these medications   azelastine 0.1 % nasal spray Commonly known as: ASTELIN Place 2 sprays into both nostrils at bedtime as needed for rhinitis. Use in each nostril as directed   MULTIVITAMIN PO Take 1 tablet by mouth daily.   simvastatin 20 MG tablet Commonly known as: ZOCOR Take 1 tablet (20 mg total) by mouth at bedtime.   VITAMIN D-3 PO Take by  mouth daily.          Objective:   Physical Exam BP 117/71 (BP Location: Left Arm, Patient Position: Sitting, Cuff Size: Small)   Pulse 67   Temp 98.2 F (36.8 C) (Oral)   Resp 18   Ht 5\' 4"  (1.626 m)   Wt 156 lb 2 oz (70.8 kg)   SpO2 98%   BMI 26.80 kg/m  General: Well developed, NAD, BMI noted Neck: No  thyromegaly  HEENT:  Normocephalic . Face symmetric, atraumatic Lungs:  CTA B Normal respiratory effort, no intercostal retractions, no accessory muscle use. Heart: RRR,  no murmur.  Abdomen:  Not distended, soft, non-tender. No rebound or rigidity.   Lower extremities: no pretibial edema bilaterally  Skin: Exposed areas without rash. Not pale. Not jaundice Neurologic:  alert & oriented X3.  Speech normal, gait appropriate for age and unassisted Strength symmetric and appropriate for age.  Psych: Cognition and judgment appear intact.  Cooperative with normal attention span and concentration.  Behavior appropriate. No anxious or depressed appearing.    Assessment      Assessment Hyperlipidemia Osteoporosis T score 2009 -2.6, , 2011 -2.4 , 2013 -2.3,  2017 -2.2  [place] started a holiday 2015 after 5 years. T score 05-2015 -2.2-continue vitamin D T score -2.9 07/20/2018 [facility]-- DJD, R ankle pain  Baker's cyst cyst, per MRI 2008 R groin -distal RLQ pain on-off,   2013 abdomen and pelvic CT   (-). Saw ortho:  not related to DJD of the hip. Carotid US 3-17: < 50%, [place] 3/19: see report, no further routine [place]   PLAN: Here for CPX Dislipidemia: On simvastatin, checking labs Osteoporosis: Developed tingling in the fingers, muscle soreness of the upper arms back pain shortly after her first Prolia shot, symptoms resolved with within a couple of weeks. Did not have any itching, rash, swelling, lip swelling. We talk about try Prolia again versus referred to rheumatology for further treatment since she already took oral  biphosphonates and seem not to tolerate Prolia. She  is unsure, will let me know in the next few weeks  what she likes to pursue.  In the meantime she remains active and takes vitamin D. RTC 6 to 8 months   This visit occurred during the SARS-CoV-2 public health emergency.  Safety protocols were in place, including screening questions prior to the visit, additional usage of staff PPE, and extensive cleaning of exam room while observing appropriate contact time as indicated for disinfecting solutions.

## 2019-09-25 NOTE — Assessment & Plan Note (Addendum)
Here for CPX Dislipidemia: On simvastatin, checking labs Osteoporosis: Developed tingling in the fingers, muscle soreness of the upper arms back pain shortly after her first Prolia shot, symptoms resolved with within a couple of weeks. Did not have any itching, rash, swelling, lip swelling. We talk about try Prolia again versus referred to rheumatology for further treatment since she already took oral  biphosphonates and seem not to tolerate Prolia. She is unsure, will let me know in the next few weeks  what she likes to pursue.  In the meantime she remains active and takes vitamin D. RTC 6 to 8 months

## 2019-10-17 ENCOUNTER — Encounter: Payer: Self-pay | Admitting: Internal Medicine

## 2019-10-18 ENCOUNTER — Encounter: Payer: Self-pay | Admitting: Internal Medicine

## 2019-10-18 NOTE — Telephone Encounter (Signed)
Sent as a phone message thru patients chart, since this is about her husband.

## 2019-12-21 ENCOUNTER — Other Ambulatory Visit: Payer: Self-pay | Admitting: Internal Medicine

## 2020-02-09 DIAGNOSIS — Z96652 Presence of left artificial knee joint: Secondary | ICD-10-CM | POA: Insufficient documentation

## 2020-02-10 DIAGNOSIS — Z96652 Presence of left artificial knee joint: Secondary | ICD-10-CM | POA: Diagnosis not present

## 2020-02-10 DIAGNOSIS — M25561 Pain in right knee: Secondary | ICD-10-CM | POA: Diagnosis not present

## 2020-04-27 ENCOUNTER — Ambulatory Visit: Payer: BC Managed Care – PPO | Admitting: Internal Medicine

## 2020-05-01 DIAGNOSIS — R69 Illness, unspecified: Secondary | ICD-10-CM | POA: Diagnosis not present

## 2020-05-01 DIAGNOSIS — Z0100 Encounter for examination of eyes and vision without abnormal findings: Secondary | ICD-10-CM | POA: Diagnosis not present

## 2020-05-05 ENCOUNTER — Other Ambulatory Visit: Payer: Self-pay

## 2020-05-05 ENCOUNTER — Encounter: Payer: Self-pay | Admitting: Internal Medicine

## 2020-05-05 ENCOUNTER — Telehealth: Payer: Self-pay

## 2020-05-05 ENCOUNTER — Ambulatory Visit (INDEPENDENT_AMBULATORY_CARE_PROVIDER_SITE_OTHER): Payer: Medicare Other | Admitting: Internal Medicine

## 2020-05-05 VITALS — BP 110/78 | HR 69 | Temp 97.8°F | Ht 64.0 in | Wt 157.0 lb

## 2020-05-05 DIAGNOSIS — R269 Unspecified abnormalities of gait and mobility: Secondary | ICD-10-CM

## 2020-05-05 DIAGNOSIS — M19079 Primary osteoarthritis, unspecified ankle and foot: Secondary | ICD-10-CM

## 2020-05-05 DIAGNOSIS — M810 Age-related osteoporosis without current pathological fracture: Secondary | ICD-10-CM | POA: Diagnosis not present

## 2020-05-05 LAB — COMPREHENSIVE METABOLIC PANEL
ALT: 17 U/L (ref 0–35)
AST: 20 U/L (ref 0–37)
Albumin: 4.4 g/dL (ref 3.5–5.2)
Alkaline Phosphatase: 65 U/L (ref 39–117)
BUN: 17 mg/dL (ref 6–23)
CO2: 30 mEq/L (ref 19–32)
Calcium: 9.3 mg/dL (ref 8.4–10.5)
Chloride: 103 mEq/L (ref 96–112)
Creatinine, Ser: 0.67 mg/dL (ref 0.40–1.20)
GFR: 87.01 mL/min (ref >60.00)
Glucose, Bld: 97 mg/dL (ref 70–99)
Potassium: 4.2 mEq/L (ref 3.5–5.1)
Sodium: 139 mEq/L (ref 135–145)
Total Bilirubin: 1.4 mg/dL — ABNORMAL HIGH (ref 0.2–1.2)
Total Protein: 6.6 g/dL (ref 6.0–8.3)

## 2020-05-05 NOTE — Patient Instructions (Signed)
   GO TO THE LAB : Get the blood work     GO TO THE FRONT DESK, PLEASE SCHEDULE YOUR APPOINTMENTS Come back for a physical exam by 09-2020

## 2020-05-05 NOTE — Progress Notes (Signed)
Subjective:    Patient ID: Alice Howard, female    DOB: 01-Sep-1945, 75 y.o.   MRN: [id number]  DOS:  05/05/2020 Type of visit - description: Follow-up  Since the last office visit is doing well. She has DJD and some difficulty with gait, no recent falls, she is trying to remain very active doing yoga, water aerobics, etc.  Review of Systems See above   Past Medical History:  Diagnosis Date  . Allergic rhinitis   . Baker's cyst    at the L per MRI ordered by ortho aprox 2008 patient thinks is B  . History of colonic polyps    multiple Cscopes,  . Hyperlipidemia   . OA (osteoarthritis)    sees Ortho, 2011 they talked about TKR L  . Osteoporosis   . Vertigo     Past Surgical History:  Procedure Laterality Date  . HEMORRHOID SURGERY  1984  . KNEE SURGERY  1994   due to skiing accident.  Has 2 screws in knee. Gets Euflexxa injections.  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 TUBAL LIGATION      Allergies as of 05/05/2020   No Known Allergies     Medication List       Accurate as of May 05, 2020  9:41 AM. If you have any questions, ask your nurse or doctor.        azelastine 0.1 % nasal spray Commonly known as: ASTELIN Place 2 sprays into both nostrils at bedtime as needed for rhinitis. Use in each nostril as directed   MULTIVITAMIN PO Take 1 tablet by mouth daily.   simvastatin 20 MG tablet Commonly known as: ZOCOR Take 1 tablet (20 mg total) by mouth at bedtime.   VITAMIN D-3 PO Take by mouth daily.          Objective:   Physical Exam BP 110/78 (BP Location: Left Arm, Patient Position: Sitting, Cuff Size: Large)   Pulse 69   Temp 97.8 F (36.6 C) (Oral)   Ht 5\' 4"  (1.626 m)   Wt 157 lb (71.2 kg)   SpO2 99%   BMI 26.95 kg/m  General:   Well developed, NAD, BMI noted. HEENT:   Normocephalic . Face symmetric, atraumatic Lungs:  CTA B Normal respiratory effort, no intercostal retractions, no accessory muscle use. Heart: RRR,  no murmur.  Lower extremities: ROM of the right ankle is significantly decreased.  + Bony enlargement without edema or redness Skin: Not pale. Not jaundice Neurologic:  alert & oriented X3.  Speech normal, gait: Limited by decreased ROM of the right ankle, some limping, unassisted Psych--  Cognition and judgment appear intact.  Cooperative with normal attention span and concentration.  Behavior appropriate. No anxious or depressed appearing.      Assessment      Assessment Hyperlipidemia Osteoporosis T score 2009 -2.6, , 2011 -2.4 , 2013 -2.3,  2017 -2.2 [place] started a holiday 2015 after 5 years. T score 05-2015 -2.2-continue vitamin D T score -2.9 07/20/2018 [facility]-- DJD, R ankle pain  Baker's cyst cyst, per MRI 2008 R groin -distal RLQ pain on-off,   2013 abdomen and pelvic CT   (-). Saw ortho:  not related to DJD of the hip. Carotid 2014 3-17: < 50%, [place] 3/19: see report, no further routine 4/19   PLAN: Osteoporosis:  had some side effects to Prolia (joint aches) but no allergic reaction thus declined further injections.  We again discussed pros cons and at this time she agreed to proceed with the Prolia, check a CMP.  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  Advised to use a cane to prevent falls, declined. RTC CPX 09-2020   Time spent: 30 minutes.  Extensive discussion about pros/cons of Prolia. She was also counseled about fall prevention and cane use.  Eventually declined need to use again. This visit occurred during the SARS-CoV-2 public health emergency.  Safety protocols were in place, including screening questions prior to  the visit, additional usage of staff PPE, and extensive cleaning of exam room while observing appropriate contact time as indicated for disinfecting solutions.

## 2020-05-05 NOTE — Telephone Encounter (Signed)
Living Will received today (05/05/2020) and sent for scan.

## 2020-05-07 NOTE — Assessment & Plan Note (Signed)
Osteoporosis:  had some side effects to Prolia (joint aches) but no allergic reaction thus declined further injections.  We again discussed pros cons and at this time she agreed to proceed with the Prolia, check a CMP.  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  Advised to use a cane to prevent falls, declined. RTC CPX 09-2020

## 2020-05-08 ENCOUNTER — Telehealth: Payer: Self-pay

## 2020-05-08 NOTE — Telephone Encounter (Signed)
-----   Message from Wanda Plump, MD sent at 05/05/2020 10:01 AM EST ----- Regarding: Please arrange for Prolia

## 2020-05-08 NOTE — Telephone Encounter (Signed)
Clinical info submitted to insurance company. Waiting on summary of benefits to determine coverage. Will call patient to discuss once received.  

## 2020-06-15 ENCOUNTER — Telehealth (INDEPENDENT_AMBULATORY_CARE_PROVIDER_SITE_OTHER): Payer: Medicare Other

## 2020-06-15 ENCOUNTER — Other Ambulatory Visit: Payer: Self-pay

## 2020-06-15 ENCOUNTER — Telehealth: Payer: Self-pay

## 2020-06-15 ENCOUNTER — Ambulatory Visit: Payer: BC Managed Care – PPO | Admitting: *Deleted

## 2020-06-15 ENCOUNTER — Ambulatory Visit: Payer: BC Managed Care – PPO

## 2020-06-15 VITALS — Ht 64.0 in | Wt 157.0 lb

## 2020-06-15 DIAGNOSIS — Z0000 Encounter for general adult medical examination without abnormal findings: Secondary | ICD-10-CM

## 2020-06-15 NOTE — Telephone Encounter (Signed)
Patient is asking if there are any updates on her Prolia injection.

## 2020-06-15 NOTE — Telephone Encounter (Signed)
PA was needed for injection. Waiting on determination

## 2020-06-15 NOTE — Progress Notes (Signed)
Subjective:   Alice Howard is a 75 y.o. female who presents for Medicare Annual (Subsequent) preventive examination.   I connected with Rashika today by telephone and verified that I am speaking with the correct person using two identifiers. Location patient: home Location provider: work Persons participating in the virtual visit: patient, Engineer, civil (consulting).    I discussed the limitations, risks, security and privacy concerns of performing an evaluation and management service by telephone and the availability of in person appointments. I also discussed with the patient that there may be a patient responsible charge related to this service. The patient expressed understanding and verbally consented to this telephonic visit.    Interactive audio and video telecommunications were attempted between this provider and patient, however failed, due to patient having technical difficulties OR patient did not have access to video capability.  We continued and completed visit with audio only.  Some vital signs may be absent or patient reported.   Time Spent with patient on telephone encounter: 25 minutes   Review of Systems     Cardiac Risk Factors include: advanced age (>30men, >56 women);dyslipidemia     Objective:    Today's Vitals   06/15/20 1021  Weight: 157 lb (71.2 kg)  Height: 5\' 4"  (1.626 m)   Body mass index is 26.95 kg/m.  Advanced Directives 06/15/2020 06/10/2019 06/05/2018 05/26/2017 05/10/2016 09/22/2012 09/16/2012  Does Patient Have a Medical Advance Directive? Yes Yes Yes Yes Yes Patient does not have advance directive;Patient would not like information Patient has advance directive, copy not in chart  Type of Advance Directive Healthcare Power of [place];Living will Healthcare Power of [place];Living will Healthcare Power of [place];Living will Healthcare Power of [place];Living will Healthcare Power of [place];Living will - Living will  Does patient want to make changes to medical advance  directive? - No - Patient declined No - Patient declined No - Patient declined - - -  Copy of Healthcare Power of Attorney in Chart? Yes - validated most recent copy scanned in chart (See row information) No - copy requested No - copy requested No - copy requested No - copy requested - Copy requested from family  Pre-existing out of facility DNR order (yellow form or pink MOST form) - - - - - - No    Current Medications (verified) Outpatient Encounter Medications as of 06/15/2020  Medication Sig  . azelastine (ASTELIN) 0.1 % nasal spray Place 2 sprays into both nostrils at bedtime as needed for rhinitis. Use in each nostril as directed  . Cholecalciferol (VITAMIN D-3 PO) Take by mouth daily.  . Multiple Vitamins-Minerals (MULTIVITAMIN PO) Take 1 tablet by mouth daily.  . simvastatin (ZOCOR) 20 MG tablet Take 1 tablet (20 mg total) by mouth at bedtime.   No facility-administered encounter medications on file as of 06/15/2020.    Allergies (verified) Patient has no known allergies.   History: Past Medical History:  Diagnosis Date  . Allergic rhinitis   . Baker's cyst    at the L per MRI ordered by ortho aprox 2008 patient thinks is B  . History of colonic polyps    multiple Cscopes,  . Hyperlipidemia   . OA (osteoarthritis)    sees Ortho, 2011 they talked about TKR L  . Osteoporosis   . Vertigo    Past Surgical History:  Procedure Laterality Date  . HEMORRHOID SURGERY  1984  . KNEE SURGERY  1994   due to skiing accident.  Has 2 screws in knee. Gets Euflexxa  injections.  Marland Kitchen. 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   Procedure: LEFT TOTAL KNEE ARTHROPLASTY WITH HARDWARE REMOVAL;  Surgeon: Loanne DrillingFrank V Aluisio, MD;  Location: [facility];  Service: Orthopedics;  Laterality: Left;  . TUBAL LIGATION     Family History  Problem Relation Age of Onset  . Colon cancer Mother 4855  . Hypertension Mother   . Cancer Father 5955       "myeloid metaplasia? similar to leukemia"  . Heart attack Other 70        GMx 2   . Breast cancer Neg Hx   . Diabetes Neg Hx    Social History   Socioeconomic History  . Marital status: Married    Spouse name: Not on file  . Number of children: 2  . Years of education: Not on file  . Highest education level: Not on file  Occupational History  . Occupation: Engineer, maintenance (IT)curriculum facilitator-- retired 2012     Employer: [employer]  Tobacco Use  . Smoking status: Never Smoker  . Smokeless tobacco: Never Used  Substance and Sexual Activity  . Alcohol use: Yes    Comment: 2 times a week, wine  . Drug use: No  . Sexual activity: Yes  Other Topics Concern  . Not on file  Social History Narrative   Household-- pt and husband, healthy marriage   3 g-children     daughter lives in [place]      Social Determinants of Health   Financial Resource Strain: Low Risk   . Difficulty of Paying Living Expenses: Not hard at all  Food Insecurity: No Food Insecurity  . Worried About Programme researcher, broadcasting/film/videounning Out of Food in the Last Year: Never true  . Ran Out of Food in the Last Year: Never true  Transportation Needs: No Transportation Needs  . Lack of Transportation (Medical): No  . Lack of Transportation (Non-Medical): No  Physical Activity: Sufficiently Active  . Days of Exercise per Week: 6 days  . Minutes of Exercise per Session: 30 min  Stress: No Stress Concern Present  . Feeling of Stress : Not at all  Social Connections: Socially Integrated  . Frequency of Communication with Friends and Family: More than three times a week  . Frequency of Social Gatherings with Friends and Family: More than three times a week  . Attends Religious Services: More than 4 times per year  . Active Member of Clubs or Organizations: Yes  . Attends BankerClub or Organization Meetings: More than 4 times per year  . Marital Status: Married    Tobacco Counseling Counseling given: Not Answered   Clinical Intake:  Pre-visit preparation completed: Yes  Pain : No/denies pain      Nutritional Status: BMI 25 -29 Overweight Nutritional Risks: None Diabetes: No  How often do you need to have someone help you when you read instructions, pamphlets, or other written materials from your doctor or pharmacy?: 1 - Never  Diabetic?No  Interpreter Needed?: No  Information entered by :: Thomasenia SalesMartha Barkley Kratochvil LPN   Activities of Daily Living In your present state of health, do you have any difficulty performing the following activities: 06/15/2020  Hearing? N  Vision? N  Difficulty concentrating or making decisions? N  Walking or climbing stairs? N  Dressing or bathing? N  Doing errands, shopping? N  Preparing Food and eating ? N  Using the Toilet? N  In the past six months, have you accidently leaked urine? N  Do you have problems with loss of  bowel control? N  Managing your Medications? N  Managing your Finances? N  Housekeeping or managing your Housekeeping? N  Some recent data might be hidden    Patient Care Team: Wanda Plump, MD as PCP - General [place], Tawanna Cooler, MD as Consulting Physician (Obstetrics and Gynecology) Toni Arthurs, MD as Consulting Physician (Orthopedic Surgery) Ulice Bold, [facility] (Inactive) as Counselor (Psychology)  Indicate any recent Medical Services you may have received from other than [facility] providers in the past year (date may be approximate).     Assessment:   This is a routine wellness examination for [place].  Hearing/Vision screen  Hearing Screening   125Hz  250Hz  500Hz  1000Hz  2000Hz  3000Hz  4000Hz  6000Hz  8000Hz   Right ear:           Left ear:           Comments: No issues  Vision Screening Comments: Wears contacts Last eye exam-04/2020-Dr.Palmer  Dietary issues and exercise activities discussed: Current Exercise Habits: Home exercise routine, Type of exercise: Other - see comments;yoga (exercise class), Time (Minutes): 30, Frequency (Times/Week): 6, Weekly Exercise (Minutes/Week): 180, Intensity: Mild, Exercise limited by: None  identified  Goals    . Patient Stated     Maintain current health    . Weight (lb) < 150 lb (68 kg)      Depression Screen PHQ 2/9 Scores 06/15/2020 05/05/2020 06/10/2019 06/05/2018 05/26/2017 05/26/2017 05/26/2017  PHQ - 2 Score 0 0 0 0 0 - 0  PHQ- 9 Score - - - - 1 - -  Exception Documentation - - - - - Patient refusal -    Fall Risk Fall Risk  06/15/2020 05/05/2020 06/10/2019 09/23/2018 06/05/2018  Falls in the past year? 0 0 0 0 0  Number falls in past yr: 0 0 0 0 -  Injury with Fall? 0 0 0 - -  Follow up Falls prevention discussed - Education provided;Falls prevention discussed - -    FALL RISK PREVENTION PERTAINING TO THE HOME:  Any stairs in or around the home? Yes  If so, are there any without handrails? No  Home free of loose throw rugs in walkways, pet beds, electrical cords, etc? Yes  Adequate lighting in your home to reduce risk of falls? Yes   ASSISTIVE DEVICES UTILIZED TO PREVENT FALLS:  Life alert? No  Use of a cane, walker or w/c? No  Grab bars in the bathroom? No  Shower chair or bench in shower? No  Elevated toilet seat or a handicapped toilet? No   TIMED UP AND GO:  Was the test performed? No . Phone visit   Cognitive Function:Normal cognitive status assessed by  this Nurse Health Advisor. No abnormalities found.   MMSE - Mini Mental State Exam 05/26/2017  Orientation to time 5  Orientation to Place 5  Registration 3  Attention/ Calculation 5  Recall 3  Language- name 2 objects 2  Language- repeat 1  Language- follow 3 step command 3  Language- read & follow direction 1  Write a sentence 1  Copy design 1  Total score 30        Immunizations Immunization History  Administered Date(s) Administered  . Fluad Quad(high Dose 65+) 01/13/2020  . Influenza Split 01/01/2011, 11/21/2011, 12/23/2012  . Influenza Whole 12/28/2009  . Influenza, High Dose Seasonal PF 12/07/2017, 12/06/2018  . Influenza-Unspecified 12/23/2013, 11/02/2014, 12/14/2015  .  PFIZER(Purple Top)SARS-COV-2 Vaccination 04/02/2019, 04/23/2019, 01/06/2020  . PPD Test 02/03/2012  . Pneumococcal Conjugate-13 03/07/2014  . Pneumococcal Polysaccharide-23 11/21/2011  .  Td 04/11/2005, 05/09/2015  . Zoster 12/10/2010  . Zoster Recombinat (Shingrix) 11/02/2018, 01/11/2019    TDAP status: Up to date  Flu Vaccine status: Up to date  Pneumococcal vaccine status: Up to date  Covid-19 vaccine status: Completed vaccines  Qualifies for Shingles Vaccine? No   Zostavax completed Yes   Shingrix Completed?: Yes  Screening Tests Health Maintenance  Topic Date Due  . PNA vac Low Risk Adult (2 of 2 - PPSV23) 08/15/2024 (Originally 11/20/2016)  . MAMMOGRAM  08/02/2020  . INFLUENZA VACCINE  10/09/2020  . COLONOSCOPY (Pts 45-32yrs Insurance coverage will need to be confirmed)  04/10/2022  . TETANUS/TDAP  05/08/2025  . DEXA SCAN  Completed  . COVID-19 Vaccine  Completed  . Hepatitis C Screening  Completed  . HPV VACCINES  Aged Out    Health Maintenance  There are no preventive care reminders to display for this patient.  Colorectal cancer screening: Type of screening: Colonoscopy. Completed 04/10/2017. Repeat every 5 years  Mammogram status: Completed Bilateral 08/03/2019. Repeat every year  Bone Density status: Completed 07/20/2018. Results reflect: Bone density results: OSTEOPOROSIS. Repeat every 2 years.  Lung Cancer Screening: (Low Dose CT Chest recommended if Age 34-80 years, 30 pack-year currently smoking OR have quit w/in 15years.) does not qualify.    Additional Screening:  Hepatitis C Screening:Completed 05/10/2016  Vision Screening: Recommended annual ophthalmology exams for early detection of glaucoma and other disorders of the eye. Is the patient up to date with their annual eye exam?  Yes  Who is the provider or what is the name of the office in which the patient attends annual eye exams? Dr. Rubye Oaks   Dental Screening: Recommended annual dental exams for  proper oral hygiene  Community Resource Referral / Chronic Care Management: CRR required this visit?  No   CCM required this visit?  No      Plan:     I have personally reviewed and noted the following in the patient's chart:   . Medical and social history . Use of alcohol, tobacco or illicit drugs  . Current medications and supplements . Functional ability and status . Nutritional status . Physical activity . Advanced directives . List of other physicians . Hospitalizations, surgeries, and ER visits in previous 12 months . Vitals . Screenings to include cognitive, depression, and falls . Referrals and appointments  In addition, I have reviewed and discussed with patient certain preventive protocols, quality metrics, and best practice recommendations. A written personalized care plan for preventive services as well as general preventive health recommendations were provided to patient.   Due to this being a telephonic visit, the after visit summary with patients personalized plan was offered to patient via mail or my-chart.  Patient would like to access on my-chart.   Roanna Raider, LPN   11/17/8336  Nurse Health Advisor  Nurse Notes: None

## 2020-06-15 NOTE — Telephone Encounter (Signed)
Prolia approved via cover my meds. I have tried calling patient, no answer. Left voicemail and sent mychart message to call back and schedule injection.

## 2020-06-15 NOTE — Patient Instructions (Signed)
Alice Howard , Thank you for taking time to complete your Medicare Wellness Visit. I appreciate your ongoing commitment to your health goals. Please review the following plan we discussed and let me know if I can assist you in the future.   Screening recommendations/referrals: Colonoscopy: Completed 04/10/2017-Due 04/10/2022 Mammogram: Completed 08/03/2019-Due 08/02/2020 Bone Density: Completed 07/20/2018-Due 07/19/2020 Recommended yearly ophthalmology/optometry visit for glaucoma screening and checkup Recommended yearly dental visit for hygiene and checkup  Vaccinations: Influenza vaccine: Up to date Pneumococcal vaccine: Completed vaccines Tdap vaccine: Up to date-Due-05/08/2025 Shingles vaccine: Completed vaccines Covid-19:Completed vaccines  Advanced directives: Copy in chart  Conditions/risks identified: See problem list  Next appointment: Follow up in one year for your annual wellness visit 06/21/2021 @ 10:20   Preventive Care 65 Years and Older, Female Preventive care refers to lifestyle choices and visits with your health care provider that can promote health and wellness. What does preventive care include?  A yearly physical exam. This is also called an annual well check.  Dental exams once or twice a year.  Routine eye exams. Ask your health care provider how often you should have your eyes checked.  Personal lifestyle choices, including:  Daily care of your teeth and gums.  Regular physical activity.  Eating a healthy diet.  Avoiding tobacco and drug use.  Limiting alcohol use.  Practicing safe sex.  Taking low-dose aspirin every day.  Taking vitamin and mineral supplements as recommended by your health care provider. What happens during an annual well check? The services and screenings done by your health care provider during your annual well check will depend on your age, overall health, lifestyle risk factors, and family history of disease. Counseling  Your  health care provider may ask you questions about your:  Alcohol use.  Tobacco use.  Drug use.  Emotional well-being.  Home and relationship well-being.  Sexual activity.  Eating habits.  History of falls.  Memory and ability to understand (cognition).  Work and work Astronomer.  Reproductive health. Screening  You may have the following tests or measurements:  Height, weight, and BMI.  Blood pressure.  Lipid and cholesterol levels. These may be checked every 5 years, or more frequently if you are over 40 years old.  Skin check.  Lung cancer screening. You may have this screening every year starting at age 23 if you have a 30-pack-year history of smoking and currently smoke or have quit within the past 15 years.  Fecal occult blood test (FOBT) of the stool. You may have this test every year starting at age 89.  Flexible sigmoidoscopy or colonoscopy. You may have a sigmoidoscopy every 5 years or a colonoscopy every 10 years starting at age 39.  Hepatitis C blood test.  Hepatitis B blood test.  Sexually transmitted disease (STD) testing.  Diabetes screening. This is done by checking your blood sugar (glucose) after you have not eaten for a while (fasting). You may have this done every 1-3 years.  Bone density scan. This is done to screen for osteoporosis. You may have this done starting at age 27.  Mammogram. This may be done every 1-2 years. Talk to your health care provider about how often you should have regular mammograms. Talk with your health care provider about your test results, treatment options, and if necessary, the need for more tests. Vaccines  Your health care provider may recommend certain vaccines, such as:  Influenza vaccine. This is recommended every year.  Tetanus, diphtheria, and acellular pertussis (Tdap, Td)  vaccine. You may need a Td booster every 10 years.  Zoster vaccine. You may need this after age 75.  Pneumococcal 13-valent  conjugate (PCV13) vaccine. One dose is recommended after age 58.  Pneumococcal polysaccharide (PPSV23) vaccine. One dose is recommended after age 49. Talk to your health care provider about which screenings and vaccines you need and how often you need them. This information is not intended to replace advice given to you by your health care provider. Make sure you discuss any questions you have with your health care provider. Document Released: 03/24/2015 Document Revised: 11/15/2015 Document Reviewed: 12/27/2014 Elsevier Interactive Patient Education  2017 [place] Prevention in the Home Falls can cause injuries. They can happen to people of all ages. There are many things you can do to make your home safe and to help prevent falls. What can I do on the outside of my home?  Regularly fix the edges of walkways and driveways and fix any cracks.  Remove anything that might make you trip as you walk through a door, such as a raised step or threshold.  Trim any bushes or trees on the path to your home.  Use bright outdoor lighting.  Clear any walking paths of anything that might make someone trip, such as rocks or tools.  Regularly check to see if handrails are loose or broken. Make sure that both sides of any steps have handrails.  Any raised decks and porches should have guardrails on the edges.  Have any leaves, snow, or ice cleared regularly.  Use sand or salt on walking paths during winter.  Clean up any spills in your garage right away. This includes oil or grease spills. What can I do in the bathroom?  Use night lights.  Install grab bars by the toilet and in the tub and shower. Do not use towel bars as grab bars.  Use non-skid mats or decals in the tub or shower.  If you need to sit down in the shower, use a plastic, non-slip stool.  Keep the floor dry. Clean up any water that spills on the floor as soon as it happens.  Remove soap buildup in the tub or  shower regularly.  Attach bath mats securely with double-sided non-slip rug tape.  Do not have throw rugs and other things on the floor that can make you trip. What can I do in the bedroom?  Use night lights.  Make sure that you have a light by your bed that is easy to reach.  Do not use any sheets or blankets that are too big for your bed. They should not hang down onto the floor.  Have a firm chair that has side arms. You can use this for support while you get dressed.  Do not have throw rugs and other things on the floor that can make you trip. What can I do in the kitchen?  Clean up any spills right away.  Avoid walking on wet floors.  Keep items that you use a lot in easy-to-reach places.  If you need to reach something above you, use a strong step stool that has a grab bar.  Keep electrical cords out of the way.  Do not use floor polish or wax that makes floors slippery. If you must use wax, use non-skid floor wax.  Do not have throw rugs and other things on the floor that can make you trip. What can I do with my stairs?  Do not leave  any items on the stairs.  Make sure that there are handrails on both sides of the stairs and use them. Fix handrails that are broken or loose. Make sure that handrails are as long as the stairways.  Check any carpeting to make sure that it is firmly attached to the stairs. Fix any carpet that is loose or worn.  Avoid having throw rugs at the top or bottom of the stairs. If you do have throw rugs, attach them to the floor with carpet tape.  Make sure that you have a light switch at the top of the stairs and the bottom of the stairs. If you do not have them, ask someone to add them for you. What else can I do to help prevent falls?  Wear shoes that:  Do not have high heels.  Have rubber bottoms.  Are comfortable and fit you well.  Are closed at the toe. Do not wear sandals.  If you use a stepladder:  Make sure that it is fully  opened. Do not climb a closed stepladder.  Make sure that both sides of the stepladder are locked into place.  Ask someone to hold it for you, if possible.  Clearly mark and make sure that you can see:  Any grab bars or handrails.  First and last steps.  Where the edge of each step is.  Use tools that help you move around (mobility aids) if they are needed. These include:  Canes.  Walkers.  Scooters.  Crutches.  Turn on the lights when you go into a dark area. Replace any light bulbs as soon as they burn out.  Set up your furniture so you have a clear path. Avoid moving your furniture around.  If any of your floors are uneven, fix them.  If there are any pets around you, be aware of where they are.  Review your medicines with your doctor. Some medicines can make you feel dizzy. This can increase your chance of falling. Ask your doctor what other things that you can do to help prevent falls. This information is not intended to replace advice given to you by your health care provider. Make sure you discuss any questions you have with your health care provider. Document Released: 12/22/2008 Document Revised: 08/03/2015 Document Reviewed: 04/01/2014 Elsevier Interactive Patient Education  2017 [employer].

## 2020-07-06 ENCOUNTER — Encounter: Payer: Self-pay | Admitting: Internal Medicine

## 2020-07-10 ENCOUNTER — Other Ambulatory Visit (HOSPITAL_BASED_OUTPATIENT_CLINIC_OR_DEPARTMENT_OTHER): Payer: Self-pay | Admitting: Internal Medicine

## 2020-07-10 DIAGNOSIS — Z1231 Encounter for screening mammogram for malignant neoplasm of breast: Secondary | ICD-10-CM

## 2020-07-11 ENCOUNTER — Other Ambulatory Visit: Payer: Self-pay | Admitting: Internal Medicine

## 2020-07-14 ENCOUNTER — Ambulatory Visit (INDEPENDENT_AMBULATORY_CARE_PROVIDER_SITE_OTHER): Payer: Medicare Other

## 2020-07-14 ENCOUNTER — Other Ambulatory Visit: Payer: Self-pay

## 2020-07-14 DIAGNOSIS — M810 Age-related osteoporosis without current pathological fracture: Secondary | ICD-10-CM | POA: Diagnosis not present

## 2020-07-14 MED ORDER — DENOSUMAB 60 MG/ML ~~LOC~~ SOSY
60.0000 mg | PREFILLED_SYRINGE | Freq: Once | SUBCUTANEOUS | Status: AC
  Administered 2020-07-14: 60 mg via SUBCUTANEOUS

## 2020-07-14 NOTE — Progress Notes (Signed)
Pt is here today for prolia injection. Pt was given prolia injection in left subq. Pt tolerated well 

## 2020-08-14 ENCOUNTER — Encounter (HOSPITAL_BASED_OUTPATIENT_CLINIC_OR_DEPARTMENT_OTHER): Payer: Self-pay

## 2020-08-14 ENCOUNTER — Other Ambulatory Visit: Payer: Self-pay

## 2020-08-14 ENCOUNTER — Ambulatory Visit (HOSPITAL_BASED_OUTPATIENT_CLINIC_OR_DEPARTMENT_OTHER)
Admission: RE | Admit: 2020-08-14 | Discharge: 2020-08-14 | Disposition: A | Discharge status: Home / Self Care | Payer: Medicare Other | Source: Ambulatory Visit | Attending: Internal Medicine | Admitting: Internal Medicine

## 2020-08-14 DIAGNOSIS — Z1231 Encounter for screening mammogram for malignant neoplasm of breast: Secondary | ICD-10-CM | POA: Insufficient documentation

## 2020-08-16 ENCOUNTER — Other Ambulatory Visit: Payer: Self-pay | Admitting: Internal Medicine

## 2020-08-16 ENCOUNTER — Telehealth: Payer: Self-pay

## 2020-08-16 DIAGNOSIS — R928 Other abnormal and inconclusive findings on diagnostic imaging of breast: Secondary | ICD-10-CM

## 2020-08-16 NOTE — Telephone Encounter (Signed)
Pt called about her mammogram done on 08/14/2020- she needs additional images- wonders if she needs to do anything- informed that she should be contacted in the next several days to schedule diagnostic mammogram and ultrasound [facility] [place], instructed her to let me know if she doesn't hear from them by Monday.

## 2020-08-30 DIAGNOSIS — H6123 Impacted cerumen, bilateral: Secondary | ICD-10-CM | POA: Diagnosis not present

## 2020-09-06 ENCOUNTER — Other Ambulatory Visit: Payer: Medicare Other

## 2020-09-15 ENCOUNTER — Ambulatory Visit: Admission: RE | Admit: 2020-09-15 | Payer: Medicare Other | Source: Ambulatory Visit

## 2020-09-15 ENCOUNTER — Ambulatory Visit
Admission: RE | Admit: 2020-09-15 | Discharge: 2020-09-15 | Disposition: A | Discharge status: Home / Self Care | Payer: Medicare Other | Source: Ambulatory Visit | Attending: Internal Medicine | Admitting: Internal Medicine

## 2020-09-15 ENCOUNTER — Other Ambulatory Visit: Payer: Self-pay

## 2020-09-15 DIAGNOSIS — R922 Inconclusive mammogram: Secondary | ICD-10-CM | POA: Diagnosis not present

## 2020-09-15 DIAGNOSIS — R928 Other abnormal and inconclusive findings on diagnostic imaging of breast: Secondary | ICD-10-CM

## 2020-10-02 ENCOUNTER — Encounter: Payer: Self-pay | Admitting: Internal Medicine

## 2020-10-02 ENCOUNTER — Ambulatory Visit (INDEPENDENT_AMBULATORY_CARE_PROVIDER_SITE_OTHER): Payer: Medicare Other | Admitting: Internal Medicine

## 2020-10-02 ENCOUNTER — Other Ambulatory Visit: Payer: Self-pay

## 2020-10-02 VITALS — BP 122/77 | HR 69 | Temp 99.0°F | Resp 16 | Ht 64.0 in | Wt 154.0 lb

## 2020-10-02 DIAGNOSIS — Z0000 Encounter for general adult medical examination without abnormal findings: Secondary | ICD-10-CM | POA: Diagnosis not present

## 2020-10-02 LAB — CBC WITH DIFFERENTIAL/PLATELET
Basophils Absolute: 0 10*3/uL (ref 0.0–0.1)
Basophils Relative: 0.9 % (ref 0.0–3.0)
Eosinophils Absolute: 0.2 10*3/uL (ref 0.0–0.7)
Eosinophils Relative: 4 % (ref 0.0–5.0)
HCT: 40.6 % (ref 36.0–46.0)
Hemoglobin: 13.6 g/dL (ref 12.0–15.0)
Lymphocytes Relative: 27 % (ref 12.0–46.0)
Lymphs Abs: 1.5 10*3/uL (ref 0.7–4.0)
MCHC: 33.4 g/dL (ref 30.0–36.0)
MCV: 93.5 fl (ref 78.0–100.0)
Monocytes Absolute: 0.5 10*3/uL (ref 0.1–1.0)
Monocytes Relative: 8.9 % (ref 3.0–12.0)
Neutro Abs: 3.2 10*3/uL (ref 1.4–7.7)
Neutrophils Relative %: 59.2 % (ref 43.0–77.0)
Platelets: 167 10*3/uL (ref 150.0–400.0)
RBC: 4.34 Mil/uL (ref 3.87–5.11)
RDW: 12.3 % (ref 11.5–15.5)
WBC: 5.4 10*3/uL (ref 4.0–10.5)

## 2020-10-02 LAB — BASIC METABOLIC PANEL
BUN: 16 mg/dL (ref 6–23)
CO2: 29 mEq/L (ref 19–32)
Calcium: 9.2 mg/dL (ref 8.4–10.5)
Chloride: 104 mEq/L (ref 96–112)
Creatinine, Ser: 0.69 mg/dL (ref 0.40–1.20)
GFR: 86.14 mL/min (ref >60.00)
Glucose, Bld: 98 mg/dL (ref 70–99)
Potassium: 4.4 mEq/L (ref 3.5–5.1)
Sodium: 141 mEq/L (ref 135–145)

## 2020-10-02 LAB — LIPID PANEL
Cholesterol: 218 mg/dL — ABNORMAL HIGH (ref 0–200)
HDL: 60.8 mg/dL (ref >39.00)
LDL Cholesterol: 136 mg/dL — ABNORMAL HIGH (ref 0–99)
NonHDL: 156.93
Total CHOL/HDL Ratio: 4
Triglycerides: 106 mg/dL (ref 0.0–149.0)
VLDL: 21.2 mg/dL (ref 0.0–40.0)

## 2020-10-02 NOTE — Assessment & Plan Note (Signed)
-  Td 04-2015 -Pneumonia 2013; prevnar 03-07-14 - zostavax 2012;  s/p shingrex  - s/p covid vax x 3, rec booster  - [facility]: 02/14/12 w/ Dr. Madilyn Fireman; 36mm polyp removed from rectum; cscope  04/10/2017,  next 5 years per BX report.   ---Was rec to see gyn prn    ---MMG 09-2020 ---diet: healthy; exercise: yoga, walks, water exercises ---Labs: BMP, FLP, CBC - POA on file

## 2020-10-02 NOTE — Patient Instructions (Addendum)
    GO TO THE LAB : Get the blood work     GO TO THE FRONT DESK, PLEASE SCHEDULE YOUR APPOINTMENTS Come back for a checkup in 6 months 

## 2020-10-02 NOTE — Assessment & Plan Note (Signed)
Here for CPX High cholesterol: On simvastatin, checking labs Osteoporosis, last Prolia was 07/2020, she did have side effects (aches and pains) but we agreed to proceed with the next shot and then check a bone density test.  On vitamin D daily. RTC 6 months

## 2020-10-02 NOTE — Progress Notes (Signed)
Subjective:    Patient ID: Alice Howard, female    DOB: [birth year], 75 y.o.   MRN: [id number]  DOS:  10/02/2020 Type of visit - description: CPX Since the last office visit she is doing okay, she does have aches and pains from DJD. Nevertheless she remains active   Review of Systems  Other than above, a 14 point review of systems is negative      Past Medical History:  Diagnosis Date   Allergic rhinitis    Baker's cyst    at the L per MRI ordered by ortho aprox 2008 patient thinks is B   History of colonic polyps    multiple Cscopes,   Hyperlipidemia    OA (osteoarthritis)    sees Ortho, 2011 they talked about TKR L   Osteoporosis    Vertigo     Past Surgical History:  Procedure Laterality Date   HEMORRHOID SURGERY  1984   KNEE SURGERY  1994   due to skiing accident.  Has 2 screws in knee. Gets Euflexxa injections.   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   Procedure: LEFT TOTAL KNEE ARTHROPLASTY WITH HARDWARE REMOVAL;  Surgeon: Loanne Drilling, MD;  Location: [facility];  Service: Orthopedics;  Laterality: Left;   TUBAL LIGATION     Social History   Socioeconomic History   Marital status: Married    Spouse name: Not on file   Number of children: 2   Years of education: Not on file   Highest education level: Not on file  Occupational History   Occupation: Engineer, maintenance (IT)-- retired 2012     Employer: [employer]  Tobacco Use   Smoking status: Never   Smokeless tobacco: Never  Substance and Sexual Activity   Alcohol use: Yes    Comment: 2 times a week, wine   Drug use: No   Sexual activity: Yes  Other Topics Concern   Not on file  Social History Narrative   Household-- pt and husband, healthy marriage   3 g-children     daughter lives in [place]      Social Determinants of Health   Financial Resource Strain: Low Risk    Difficulty of Paying Living Expenses: Not hard at all  Food Insecurity: No Food Insecurity   Worried About  Programme researcher, broadcasting/film/video in the Last Year: Never true   Barista in the Last Year: Never true  Transportation Needs: No Transportation Needs   Lack of Transportation (Medical): No   Lack of Transportation (Non-Medical): No  Physical Activity: Sufficiently Active   Days of Exercise per Week: 6 days   Minutes of Exercise per Session: 30 min  Stress: No Stress Concern Present   Feeling of Stress : Not at all  Social Connections: Socially Integrated   Frequency of Communication with Friends and Family: More than three times a week   Frequency of Social Gatherings with Friends and Family: More than three times a week   Attends Religious Services: More than 4 times per year   Active Member of [employer] or Organizations: Yes   Attends Engineer, structural: More than 4 times per year   Marital Status: Married  Catering manager Violence: Not At Risk   Fear of Current or Ex-Partner: No   Emotionally Abused: No   Physically Abused: No   Sexually Abused: No    Allergies as of 10/02/2020   No Known Allergies      Medication List  Accurate as of October 02, 2020  6:54 PM. If you have any questions, ask your nurse or doctor.          azelastine 0.1 % nasal spray Commonly known as: ASTELIN Place 2 sprays into both nostrils at bedtime as needed for rhinitis. Use in each nostril as directed   MULTIVITAMIN PO Take 1 tablet by mouth daily.   simvastatin 20 MG tablet Commonly known as: ZOCOR Take 1 tablet (20 mg total) by mouth at bedtime.   VITAMIN D-3 PO Take by mouth daily.           Objective:   Physical Exam BP 122/77 (BP Location: Left Arm, Patient Position: Sitting, Cuff Size: Small)   Pulse 69   Temp 99 F (37.2 C) (Oral)   Resp 16   Ht 5\' 4"  (1.626 m)   Wt 154 lb (69.9 kg)   SpO2 95%   BMI 26.43 kg/m  General: Well developed, NAD, BMI noted Neck: No  thyromegaly  HEENT:  Normocephalic . Face symmetric, atraumatic Lungs:  CTA B Normal  respiratory effort, no intercostal retractions, no accessory muscle use. Heart: RRR,  no murmur.  Abdomen:  Not distended, soft, non-tender. No rebound or rigidity.   Lower extremities: no pretibial edema bilaterally  Skin: Exposed areas without rash. Not pale. Not jaundice Neurologic:  alert & oriented X3.  Speech normal, gait appropriate for age and unassisted Strength symmetric and appropriate for age.  Psych: Cognition and judgment appear intact.  Cooperative with normal attention span and concentration.  Behavior appropriate. No anxious or depressed appearing.     Assessment     Assessment Hyperlipidemia Osteoporosis T score 2009 -2.6, , 2011 -2.4 , 2013 -2.3,  2017 -2.2 [place] started a holiday 2015 after 5 years. T score 05-2015 -2.2-continue vitamin D T score -2.9 07/20/2018 [facility]-- DJD, R ankle pain  Baker's cyst cyst, per MRI 2008 R groin -distal RLQ pain on-off,   2013 abdomen and pelvic CT   (-). Saw ortho:  not related to DJD of the hip. Carotid 2014 3-17: < 50%, [place] 3/19: see report, no further routine 4/19   PLAN: Here for CPX High cholesterol: On simvastatin, checking labs Osteoporosis, last Prolia was 07/2020, she did have side effects (aches and pains) but we agreed to proceed with the next shot and then check a bone density test.  On vitamin D daily. RTC 6 months    This visit occurred during the SARS-CoV-2 public health emergency.  Safety protocols were in place, including screening questions prior to the visit, additional usage of staff PPE, and extensive cleaning of exam room while observing appropriate contact time as indicated for disinfecting solutions.

## 2020-10-04 ENCOUNTER — Encounter: Payer: Self-pay | Admitting: Internal Medicine

## 2020-10-04 MED ORDER — SIMVASTATIN 40 MG PO TABS
40.0000 mg | ORAL_TABLET | Freq: Every day | ORAL | 2 refills | Status: DC
Start: 2020-10-04 — End: 2021-10-15

## 2020-10-04 NOTE — Addendum Note (Signed)
Addended byConrad Merrick D on: 10/04/2020 04:48 PM   Modules accepted: Orders

## 2020-11-29 ENCOUNTER — Encounter: Payer: Self-pay | Admitting: Internal Medicine

## 2020-12-07 ENCOUNTER — Ambulatory Visit (INDEPENDENT_AMBULATORY_CARE_PROVIDER_SITE_OTHER): Payer: Medicare Other | Admitting: Internal Medicine

## 2020-12-07 ENCOUNTER — Other Ambulatory Visit: Payer: Self-pay

## 2020-12-07 ENCOUNTER — Encounter: Payer: Self-pay | Admitting: Internal Medicine

## 2020-12-07 VITALS — BP 122/68 | HR 81 | Temp 98.3°F | Resp 16 | Ht 64.0 in | Wt 155.1 lb

## 2020-12-07 DIAGNOSIS — R42 Dizziness and giddiness: Secondary | ICD-10-CM | POA: Diagnosis not present

## 2020-12-07 DIAGNOSIS — R079 Chest pain, unspecified: Secondary | ICD-10-CM | POA: Diagnosis not present

## 2020-12-07 DIAGNOSIS — M542 Cervicalgia: Secondary | ICD-10-CM

## 2020-12-07 NOTE — Patient Instructions (Signed)
Please watch your symptoms, call if the return or if you have severe dizziness

## 2020-12-07 NOTE — Progress Notes (Signed)
Subjective:    Patient ID: Alice Howard, female    DOB: Dec 07, 1945, 75 y.o.   MRN: [id number]  DOS:  12/07/2020 Type of visit - description: Acute  Last week she had several symptoms: For 1 day, on the left side over the posterior neck hurt, it increased somewhat when she move her neck, no radiation down the arms. She had an episode of lightheadedness, not described as spinning, "just lightheaded".  This is not something new it has been happening on and off for a while. Also had 2 episodes of chest pain, left-sided, at rest, lasted 1 or 2 seconds, not associated with deep breathing or moving her torso.  No further episodes, no substernal chest pain.  She exercises regularly without problems. Also she had some sinus congestion but that is better.  States that she like some reassurance that she did not have heart attack or stroke.  Denies palpitations No nausea or vomiting No rash in the chest No cough No diplopia, slurred speech or facial deficits.  No upper or lower extremity weakness.   Review of Systems See above   Past Medical History:  Diagnosis Date   Allergic rhinitis    Baker's cyst    at the L per MRI ordered by ortho aprox 2008 patient thinks is B   History of colonic polyps    multiple Cscopes,   Hyperlipidemia    OA (osteoarthritis)    sees Ortho, 2011 they talked about TKR L   Osteoporosis    Vertigo     Past Surgical History:  Procedure Laterality Date   HEMORRHOID SURGERY  1984   KNEE SURGERY  1994   due to skiing accident.  Has 2 screws in knee. Gets Euflexxa injections.   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   Procedure: LEFT TOTAL KNEE ARTHROPLASTY WITH HARDWARE REMOVAL;  Surgeon: Loanne Drilling, MD;  Location: [facility];  Service: Orthopedics;  Laterality: Left;   TUBAL LIGATION      Allergies as of 12/07/2020   No Known Allergies      Medication List        Accurate as of December 07, 2020  3:08 PM. If you have any questions, ask your  nurse or doctor.          azelastine 0.1 % nasal spray Commonly known as: ASTELIN Place 2 sprays into both nostrils at bedtime as needed for rhinitis. Use in each nostril as directed   MULTIVITAMIN PO Take 1 tablet by mouth daily.   simvastatin 40 MG tablet Commonly known as: ZOCOR Take 1 tablet (40 mg total) by mouth at bedtime.   VITAMIN D-3 PO Take by mouth daily.           Objective:   Physical Exam BP 122/68 (BP Location: Left Arm, Patient Position: Sitting, Cuff Size: Small)   Pulse 81   Temp 98.3 F (36.8 C) (Oral)   Resp 16   Ht 5\' 4"  (1.626 m)   Wt 155 lb 2 oz (70.4 kg)   SpO2 97%   BMI 26.63 kg/m  General:   Well developed, NAD, BMI noted. HEENT:  Normocephalic .  Daily Neck: Normal carotid pulses  lungs:  CTA B Normal respiratory effort, no intercostal retractions, no accessory muscle use. Heart: RRR,  no murmur.  Lower extremities: no pretibial edema bilaterally  Skin: Not pale. Not jaundice Neurologic:  alert & oriented X3.  Speech normal, gait appropriate for age and unassisted. Face symmetric, tongue midline, EOMI.  Motor symmetric. Psych--  Cognition and judgment appear intact.  Cooperative with normal attention span and concentration.  Behavior appropriate. No anxious or depressed appearing.      Assessment     Assessment Hyperlipidemia Osteoporosis T score 2009 -2.6, , 2011 -2.4 , 2013 -2.3,  2017 -2.2 [place] started a holiday 2015 after 5 years. T score 05-2015 -2.2-continue vitamin D T score -2.9 07/20/2018 [facility]-- DJD, R ankle pain  Baker's cyst cyst, per MRI 2008 R groin -distal RLQ pain on-off,   2013 abdomen and pelvic CT   (-). Saw ortho:  not related to DJD of the hip. Carotid US 3-17: < 50%, [place] 3/19: see report, no further routine [place]   PLAN: Neck pain, lightheadedness, chest pain: Symptoms as described above, on clinical grounds she does not have any coronary or stroke type of syndrome. Neurological exam is normal. We  agreed on observation, at this time she reports that she had a lot of stress, family related.  Wonders if that is playing a role. Again, plan is observation, warning symptoms such as substernal chest pain or stroke symptoms discussed with the patient. She verbalized understanding.     This visit occurred during the SARS-CoV-2 public health emergency.  Safety protocols were in place, including screening questions prior to the visit, additional usage of staff PPE, and extensive cleaning of exam room while observing appropriate contact time as indicated for disinfecting solutions.

## 2020-12-08 NOTE — Assessment & Plan Note (Signed)
Neck pain, lightheadedness, chest pain: Symptoms as described above, on clinical grounds she does not have any coronary or stroke type of syndrome. Neurological exam is normal. We agreed on observation, at this time she reports that she had a lot of stress, family related.  Wonders if that is playing a role. Again, plan is observation, warning symptoms such as substernal chest pain or stroke symptoms discussed with the patient. She verbalized understanding.

## 2021-01-18 ENCOUNTER — Other Ambulatory Visit: Payer: Self-pay | Admitting: Internal Medicine

## 2021-01-19 ENCOUNTER — Ambulatory Visit: Payer: Medicare Other

## 2021-02-05 ENCOUNTER — Telehealth: Payer: Self-pay

## 2021-02-05 DIAGNOSIS — M810 Age-related osteoporosis without current pathological fracture: Secondary | ICD-10-CM

## 2021-02-05 NOTE — Telephone Encounter (Signed)
Mychart message sent.

## 2021-02-05 NOTE — Telephone Encounter (Signed)
According to last OV:  "Osteoporosis, last Prolia was 07/2020, she did have side effects (aches and pains) but we agreed to proceed with the next shot and then check a bone density test.  On vitamin D daily. RTC 6 months"  Based on Summary of benefits, pt is responsible for %20 of injection cost equaling $290.00 at time of injection. If pt is agreeable, okay to schedule.    (PA approved from 06/15/20 until 04/072023)

## 2021-02-12 NOTE — Addendum Note (Signed)
Addended byConrad  D on: 02/12/2021 11:34 AM   Modules accepted: Orders

## 2021-02-22 ENCOUNTER — Telehealth: Payer: Self-pay | Admitting: Internal Medicine

## 2021-02-22 NOTE — Telephone Encounter (Signed)
See patient's message, reluctant to take Prolia due to side effects, reluctant to see endocrinology. Plan: Recommend to start Fosamax, once weekly, explain precautions to her.   Call patient and let her know.

## 2021-02-22 NOTE — Telephone Encounter (Signed)
LMOM informing Pt of recommendations and precautions, instructed her to call or send mychart message if she would like to try Fosamax.

## 2021-02-28 ENCOUNTER — Encounter: Payer: Self-pay | Admitting: Internal Medicine

## 2021-02-28 DIAGNOSIS — M810 Age-related osteoporosis without current pathological fracture: Secondary | ICD-10-CM

## 2021-03-29 ENCOUNTER — Telehealth (INDEPENDENT_AMBULATORY_CARE_PROVIDER_SITE_OTHER): Payer: Medicare Other | Admitting: Medical

## 2021-03-29 ENCOUNTER — Encounter: Payer: Self-pay | Admitting: Internal Medicine

## 2021-03-29 DIAGNOSIS — U071 COVID-19: Secondary | ICD-10-CM

## 2021-03-29 DIAGNOSIS — R051 Acute cough: Secondary | ICD-10-CM

## 2021-03-29 MED ORDER — MOLNUPIRAVIR EUA 200MG CAPSULE: 4.0000 | ORAL_CAPSULE | Freq: Two times a day (BID) | ORAL | 0 refills | Status: AC

## 2021-03-29 MED ORDER — BENZONATATE 100 MG PO CAPS: 100.0000 mg | ORAL_CAPSULE | Freq: Three times a day (TID) | ORAL | 0 refills | Status: DC | PRN

## 2021-03-29 NOTE — Patient Instructions (Addendum)
Covid infection in patient vaccinated x4. No prior covid infection. Milder type symptoms. Day 2 of symptoms.  Appears clinically stable.   Prescribing benzonatate for cough and Flonase for nasal congestion.  Recommend getting oxygen saturation monitor over-the-counter and check O2 sats daily.  Should be above 96%.  If not notify us.  Making molnupiravir antiviral available.  Explained benefit of taking antiviral within 5 days of symptom onset.  Counseled patient and she was little bit hesitant to start but when she could make that decision for herself.  I did explain potential shortening post COVID fatigue or other complication.  Follow-up in 7 days or sooner if needed.

## 2021-03-29 NOTE — Progress Notes (Signed)
° °  Subjective:    Patient ID: Alice Howard, female    DOB: October 13, 1945, 76 y.o.   MRN: [id number]  HPI  Virtual Visit via Video Note  I connected with Alice Howard on 03/29/21 at  2:40 PM EST by a video enabled telemedicine application and verified that I am speaking with the correct person using two identifiers.  Location: Patient: home Provider: office   I discussed the limitations of evaluation and management by telemedicine and the availability of in person appointments. The patient expressed understanding and agreed to proceed.  History of Present Illness:   Pt recently diagnosed with covid on   Test used-over the counter. Tested yesterday.  History of covid vaccine x 2.  Covid risk score- 2  History of covid infection-no  Current symptoms- pt states she has had nasal congestion, mild pnd, mild scratchy throat  and runny nose. However some severe fatigue/no energy since Tuesday afternoon. Pt has been using mucinex. Today her energy level is improved. States about 1/2 normal energy as normal. Has very minimal cough.No wheezing or shortness of breath. No diffuse myalgias/nothing significant.  Pt 02 sat-none at home.    Observations/Objective:  General-no acute distress, pleasant, oriented. Lungs- on inspection lungs appear unlabored. Neck- no tracheal deviation or jvd on inspection. Neuro- gross motor function appears intact.   Assessment and Plan: Patient Instructions  Covid infection in patient vaccinated x4. No prior covid infection. Milder type symptoms. Day 2 of symptoms.  Appears clinically stable.   Prescribing benzonatate for cough and Flonase for nasal congestion.  Recommend getting oxygen saturation monitor over-the-counter and check O2 sats daily.  Should be above 96%.  If not notify us.  Making molnupiravir antiviral available.  Explained benefit of taking antiviral within 5 days of symptom onset.  Counseled patient and she was little bit hesitant to  start but when she could make that decision for herself.  I did explain potential shortening post COVID fatigue or other complication.  Follow-up in 7 days or sooner if needed.       Mackie Pai, PA-C   Follow Up Instructions:    I discussed the assessment and treatment plan with the patient. The patient was provided an opportunity to ask questions and all were answered. The patient agreed with the plan and demonstrated an understanding of the instructions.   The patient was advised to call back or seek an in-person evaluation if the symptoms worsen or if the condition fails to improve as anticipated.  Time spent with patient today was 20 minutes which consisted of chart revdiew, discussing diagnosis, work up treatment and documentation.    Mackie Pai, PA-C    Review of Systems  Constitutional:  Positive for fatigue. Negative for chills and fever.  HENT:  Positive for congestion.   Respiratory:  Positive for cough. Negative for chest tightness, shortness of breath and wheezing.   Cardiovascular:  Negative for chest pain and palpitations.  Gastrointestinal:  Negative for abdominal pain.  Musculoskeletal:  Negative for back pain and neck pain.  Neurological:  Negative for dizziness and headaches.  Hematological:  Negative for adenopathy. Does not bruise/bleed easily.  Psychiatric/Behavioral:  Negative for behavioral problems and confusion.       Objective:   Physical Exam        Assessment & Plan:

## 2021-04-01 ENCOUNTER — Encounter: Payer: Self-pay | Admitting: Medical

## 2021-04-02 ENCOUNTER — Other Ambulatory Visit: Payer: Self-pay | Admitting: Medical

## 2021-04-02 MED ORDER — FLUTICASONE PROPIONATE 50 MCG/ACT NA SUSP: 2.0000 | Freq: Every day | NASAL | 1 refills | Status: DC

## 2021-04-02 NOTE — Addendum Note (Signed)
Addended by: Gwenevere Abbot on: 04/02/2021 09:08 AM   Modules accepted: Orders

## 2021-04-05 ENCOUNTER — Encounter: Payer: Self-pay | Admitting: Internal Medicine

## 2021-04-11 ENCOUNTER — Encounter: Payer: Self-pay | Admitting: Internal Medicine

## 2021-04-19 ENCOUNTER — Encounter: Payer: Self-pay | Admitting: Internal Medicine

## 2021-04-20 ENCOUNTER — Encounter: Payer: Self-pay | Admitting: Internal Medicine

## 2021-04-23 ENCOUNTER — Ambulatory Visit: Payer: Medicare Other | Admitting: Internal Medicine

## 2021-05-10 ENCOUNTER — Encounter: Payer: Self-pay | Admitting: Internal Medicine

## 2021-05-21 DIAGNOSIS — M19071 Primary osteoarthritis, right ankle and foot: Secondary | ICD-10-CM | POA: Diagnosis not present

## 2021-05-23 ENCOUNTER — Telehealth: Payer: Self-pay

## 2021-05-23 NOTE — Telephone Encounter (Signed)
Called pt to rescheduled medicare wellness on 06/21/2021 ?

## 2021-06-08 DIAGNOSIS — M1711 Unilateral primary osteoarthritis, right knee: Secondary | ICD-10-CM | POA: Diagnosis not present

## 2021-06-08 DIAGNOSIS — Z96652 Presence of left artificial knee joint: Secondary | ICD-10-CM | POA: Diagnosis not present

## 2021-06-08 DIAGNOSIS — M25561 Pain in right knee: Secondary | ICD-10-CM | POA: Diagnosis not present

## 2021-06-11 DIAGNOSIS — Z01419 Encounter for gynecological examination (general) (routine) without abnormal findings: Secondary | ICD-10-CM | POA: Diagnosis not present

## 2021-06-20 ENCOUNTER — Ambulatory Visit: Payer: Medicare Other

## 2021-06-21 ENCOUNTER — Ambulatory Visit: Payer: Medicare Other

## 2021-06-29 ENCOUNTER — Ambulatory Visit: Payer: Medicare Other

## 2021-07-02 ENCOUNTER — Ambulatory Visit (INDEPENDENT_AMBULATORY_CARE_PROVIDER_SITE_OTHER): Payer: Medicare Other

## 2021-07-02 DIAGNOSIS — Z0000 Encounter for general adult medical examination without abnormal findings: Secondary | ICD-10-CM

## 2021-07-02 NOTE — Patient Instructions (Signed)
Alice Howard , ?Thank you for taking time to come for your Medicare Wellness Visit. I appreciate your ongoing commitment to your health goals. Please review the following plan we discussed and let me know if I can assist you in the future.  ? ?Screening recommendations/referrals: ?Colonoscopy: 04/10/17 due 04/10/22 ?Mammogram: 08/14/20 due 08/14/21 ?Bone Density: declined ?Recommended yearly ophthalmology/optometry visit for glaucoma screening and checkup ?Recommended yearly dental visit for hygiene and checkup ? ?Vaccinations: ?Influenza vaccine: up to date ?Pneumococcal vaccine: Due-May obtain vaccine at our local pharmacy.  ?Tdap vaccine: up to date ?Shingles vaccine: up to date   ?Covid-19:Due-May obtain vaccine at your local pharmacy.  ? ?Advanced directives: yes, on file ? ?Conditions/risks identified: see problem list  ? ?Next appointment: Follow up in one year for your annual wellness visit  ? ? ?Preventive Care 26 Years and Older, Female ?Preventive care refers to lifestyle choices and visits with your health care provider that can promote health and wellness. ?What does preventive care include? ?A yearly physical exam. This is also called an annual well check. ?Dental exams once or twice a year. ?Routine eye exams. Ask your health care provider how often you should have your eyes checked. ?Personal lifestyle choices, including: ?Daily care of your teeth and gums. ?Regular physical activity. ?Eating a healthy diet. ?Avoiding tobacco and drug use. ?Limiting alcohol use. ?Practicing safe sex. ?Taking low-dose aspirin every day. ?Taking vitamin and mineral supplements as recommended by your health care provider. ?What happens during an annual well check? ?The services and screenings done by your health care provider during your annual well check will depend on your age, overall health, lifestyle risk factors, and family history of disease. ?Counseling  ?Your health care provider may ask you questions about  your: ?Alcohol use. ?Tobacco use. ?Drug use. ?Emotional well-being. ?Home and relationship well-being. ?Sexual activity. ?Eating habits. ?History of falls. ?Memory and ability to understand (cognition). ?Work and work Astronomer. ?Reproductive health. ?Screening  ?You may have the following tests or measurements: ?Height, weight, and BMI. ?Blood pressure. ?Lipid and cholesterol levels. These may be checked every 5 years, or more frequently if you are over 32 years old. ?Skin check. ?Lung cancer screening. You may have this screening every year starting at age 83 if you have a 30-pack-year history of smoking and currently smoke or have quit within the past 15 years. ?Fecal occult blood test (FOBT) of the stool. You may have this test every year starting at age 16. ?Flexible sigmoidoscopy or colonoscopy. You may have a sigmoidoscopy every 5 years or a colonoscopy every 10 years starting at age 98. ?Hepatitis C blood test. ?Hepatitis B blood test. ?Sexually transmitted disease (STD) testing. ?Diabetes screening. This is done by checking your blood sugar (glucose) after you have not eaten for a while (fasting). You may have this done every 1-3 years. ?Bone density scan. This is done to screen for osteoporosis. You may have this done starting at age 49. ?Mammogram. This may be done every 1-2 years. Talk to your health care provider about how often you should have regular mammograms. ?Talk with your health care provider about your test results, treatment options, and if necessary, the need for more tests. ?Vaccines  ?Your health care provider may recommend certain vaccines, such as: ?Influenza vaccine. This is recommended every year. ?Tetanus, diphtheria, and acellular pertussis (Tdap, Td) vaccine. You may need a Td booster every 10 years. ?Zoster vaccine. You may need this after age 50. ?Pneumococcal 13-valent conjugate (PCV13) vaccine. One  dose is recommended after age 6. ?Pneumococcal polysaccharide (PPSV23) vaccine.  One dose is recommended after age 24. ?Talk to your health care provider about which screenings and vaccines you need and how often you need them. ?This information is not intended to replace advice given to you by your health care provider. Make sure you discuss any questions you have with your health care provider. ?Document Released: 03/24/2015 Document Revised: 11/15/2015 Document Reviewed: 12/27/2014 ?Elsevier Interactive Patient Education ? 2017 [place]. ? ?Fall Prevention in the Home ?Falls can cause injuries. They can happen to people of all ages. There are many things you can do to make your home safe and to help prevent falls. ?What can I do on the outside of my home? ?Regularly fix the edges of walkways and driveways and fix any cracks. ?Remove anything that might make you trip as you walk through a door, such as a raised step or threshold. ?Trim any bushes or trees on the path to your home. ?Use bright outdoor lighting. ?Clear any walking paths of anything that might make someone trip, such as rocks or tools. ?Regularly check to see if handrails are loose or broken. Make sure that both sides of any steps have handrails. ?Any raised decks and porches should have guardrails on the edges. ?Have any leaves, snow, or ice cleared regularly. ?Use sand or salt on walking paths during winter. ?Clean up any spills in your garage right away. This includes oil or grease spills. ?What can I do in the bathroom? ?Use night lights. ?Install grab bars by the toilet and in the tub and shower. Do not use towel bars as grab bars. ?Use non-skid mats or decals in the tub or shower. ?If you need to sit down in the shower, use a plastic, non-slip stool. ?Keep the floor dry. Clean up any water that spills on the floor as soon as it happens. ?Remove soap buildup in the tub or shower regularly. ?Attach bath mats securely with double-sided non-slip rug tape. ?Do not have throw rugs and other things on the floor that can make  you trip. ?What can I do in the bedroom? ?Use night lights. ?Make sure that you have a light by your bed that is easy to reach. ?Do not use any sheets or blankets that are too big for your bed. They should not hang down onto the floor. ?Have a firm chair that has side arms. You can use this for support while you get dressed. ?Do not have throw rugs and other things on the floor that can make you trip. ?What can I do in the kitchen? ?Clean up any spills right away. ?Avoid walking on wet floors. ?Keep items that you use a lot in easy-to-reach places. ?If you need to reach something above you, use a strong step stool that has a grab bar. ?Keep electrical cords out of the way. ?Do not use floor polish or wax that makes floors slippery. If you must use wax, use non-skid floor wax. ?Do not have throw rugs and other things on the floor that can make you trip. ?What can I do with my stairs? ?Do not leave any items on the stairs. ?Make sure that there are handrails on both sides of the stairs and use them. Fix handrails that are broken or loose. Make sure that handrails are as long as the stairways. ?Check any carpeting to make sure that it is firmly attached to the stairs. Fix any carpet that is loose or worn. ?  Avoid having throw rugs at the top or bottom of the stairs. If you do have throw rugs, attach them to the floor with carpet tape. ?Make sure that you have a light switch at the top of the stairs and the bottom of the stairs. If you do not have them, ask someone to add them for you. ?What else can I do to help prevent falls? ?Wear shoes that: ?Do not have high heels. ?Have rubber bottoms. ?Are comfortable and fit you well. ?Are closed at the toe. Do not wear sandals. ?If you use a stepladder: ?Make sure that it is fully opened. Do not climb a closed stepladder. ?Make sure that both sides of the stepladder are locked into place. ?Ask someone to hold it for you, if possible. ?Clearly mark and make sure that you can  see: ?Any grab bars or handrails. ?First and last steps. ?Where the edge of each step is. ?Use tools that help you move around (mobility aids) if they are needed. These include: ?Canes. ?Walkers. ?Scooters. ?Crutche

## 2021-07-02 NOTE — Progress Notes (Addendum)
? ?Subjective:  ? Alice Howard is a 76 y.o. female who presents for Medicare Annual (Subsequent) preventive examination. ? ?I connected with  Alice Howard on 07/02/21 by a audio enabled telemedicine application and verified that I am speaking with the correct person using two identifiers. ? ?Patient Location: Home ? ?Provider Location: Office/Clinic ? ?I discussed the limitations of evaluation and management by telemedicine. The patient expressed understanding and agreed to proceed.  ? ?Review of Systems    ? ?Cardiac Risk Factors include: advanced age (>2355men, 62>65 women);dyslipidemia ? ?   ?Objective:  ?  ?There were no vitals filed for this visit. ?There is no height or weight on file to calculate BMI. ? ? ?  07/02/2021  ? 11:47 AM 06/15/2020  ? 10:25 AM 06/10/2019  ?  3:10 PM 06/05/2018  ? 11:24 AM 05/26/2017  ?  8:29 AM 05/10/2016  ?  8:44 AM 09/22/2012  ?  6:00 AM  ?Advanced Directives  ?Does Patient Have a Medical Advance Directive? Yes Yes Yes Yes Yes Yes Patient does not have advance directive;Patient would not like information  ?Type of Estate agentAdvance Directive Healthcare Power of [place]Attorney;Living will;Out of facility DNR (pink MOST or yellow form) Healthcare Power of [place];Living will Healthcare Power of [place];Living will Healthcare Power of [place];Living will Healthcare Power of [place];Living will Healthcare Power of [place];Living will   ?Does patient want to make changes to medical advance directive?   No - Patient declined No - Patient declined No - Patient declined    ?Copy of Healthcare Power of Attorney in Chart? Yes - validated most recent copy scanned in chart (See row information) Yes - validated most recent copy scanned in chart (See row information) No - copy requested No - copy requested No - copy requested No - copy requested   ? ? ?Current Medications (verified) ?Outpatient Encounter Medications as of 07/02/2021  ?Medication Sig  ? azelastine (ASTELIN) 0.1 % nasal spray Place 2 sprays into both  nostrils at bedtime as needed for rhinitis. Use in each nostril as directed  ? Cholecalciferol (VITAMIN D-3 PO) Take by mouth daily.  ? fluticasone (FLONASE) 50 MCG/ACT nasal spray SHAKE LIQUID AND USE 2 SPRAYS IN EACH NOSTRIL DAILY  ? Multiple Vitamins-Minerals (MULTIVITAMIN PO) Take 1 tablet by mouth daily.  ? simvastatin (ZOCOR) 40 MG tablet Take 1 tablet (40 mg total) by mouth at bedtime.  ? [DISCONTINUED] benzonatate (TESSALON) 100 MG capsule Take 1 capsule (100 mg total) by mouth 3 (three) times daily as needed for cough.  ? ?No facility-administered encounter medications on file as of 07/02/2021.  ? ? ?Allergies (verified) ?Patient has no known allergies.  ? ?History: ?Past Medical History:  ?Diagnosis Date  ? Allergic rhinitis   ? Baker's cyst   ? at the L per MRI ordered by ortho aprox 2008 patient thinks is B  ? History of colonic polyps   ? multiple Cscopes,  ? Hyperlipidemia   ? OA (osteoarthritis)   ? sees Ortho, 2011 they talked about TKR L  ? Osteoporosis   ? Vertigo   ? ?Past Surgical History:  ?Procedure Laterality Date  ? HEMORRHOID SURGERY  1984  ? KNEE SURGERY  1994  ? due to skiing accident.  Has 2 screws in knee. Gets Euflexxa injections.  ? 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  ? Procedure: LEFT TOTAL KNEE ARTHROPLASTY WITH HARDWARE REMOVAL;  Surgeon: Loanne DrillingFrank V Aluisio, MD;  Location: [facility];  Service: Orthopedics;  Laterality: Left;  ?  TUBAL LIGATION    ? ?Family History  ?Problem Relation Age of Onset  ? Colon cancer Mother 42  ? Hypertension Mother   ? Cancer Father 62  ?     "myeloid metaplasia? similar to leukemia"  ? Heart attack Other 70  ?     GMx 2   ? Breast cancer Neg Hx   ? Diabetes Neg Hx   ? ?Social History  ? ?Socioeconomic History  ? Marital status: Married  ?  Spouse name: Not on file  ? Number of children: 2  ? Years of education: Not on file  ? Highest education level: Not on file  ?Occupational History  ? Occupation: Engineer, maintenance (IT)-- retired 2012   ?  Employer:  [employer]  ?Tobacco Use  ? Smoking status: Never  ? Smokeless tobacco: Never  ?Substance and Sexual Activity  ? Alcohol use: Yes  ?  Comment: 2 times a week, wine  ? Drug use: No  ? Sexual activity: Yes  ?Other Topics Concern  ? Not on file  ?Social History Narrative  ? Household-- pt and husband, healthy marriage  ? 3 g-children    ? daughter lives in [place]  ?   ? ?Social Determinants of Health  ? ?Financial Resource Strain: Not on file  ?Food Insecurity: Not on file  ?Transportation Needs: Not on file  ?Physical Activity: Not on file  ?Stress: Not on file  ?Social Connections: Not on file  ? ? ?Tobacco Counseling ?Counseling given: Not Answered ? ? ?Clinical Intake: ? ?Pre-visit preparation completed: Yes ? ?Pain : No/denies pain ? ?  ? ?Nutritional Risks: None ?Diabetes: No ? ?How often do you need to have someone help you when you read instructions, pamphlets, or other written materials from your doctor or pharmacy?: 1 - Never ? ?Diabetic?No ? ?Interpreter Needed?: No ? ?Information entered by :: Dantae Meunier ? ? ?Activities of Daily Living ? ?  07/02/2021  ? 11:45 AM  ?In your present state of health, do you have any difficulty performing the following activities:  ?Hearing? 0  ?Vision? 0  ?Difficulty concentrating or making decisions? 0  ?Walking or climbing stairs? 0  ?Dressing or bathing? 0  ?Doing errands, shopping? 0  ?Preparing Food and eating ? N  ?Using the Toilet? N  ?In the past six months, have you accidently leaked urine? N  ?Managing your Medications? N  ?Managing your Finances? N  ?Housekeeping or managing your Housekeeping? N  ? ? ?Patient Care Team: ?Wanda Plump, MD as PCP - General ?Lavina Hamman, MD as Consulting Physician (Obstetrics and Gynecology) ?Toni Arthurs, MD as Consulting Physician (Orthopedic Surgery) ?Ulice Bold, [facility] (Inactive) as Veterinary surgeon (Psychology) ? ?Indicate any recent Medical Services you may have received from other than [facility] providers in the  past year (date may be approximate). ? ?   ?Assessment:  ? This is a routine wellness examination for [facility]. ? ?Hearing/Vision screen ?No results found. ? ?Dietary issues and exercise activities discussed: ?Current Exercise Habits: Home exercise routine, Type of exercise: stretching;walking;Other - see comments;yoga, Time (Minutes): 60, Frequency (Times/Week): 7, Weekly Exercise (Minutes/Week): 420, Intensity: Mild ? ? Goals Addressed   ? ?  ?  ?  ?  ? This Visit's Progress  ?  Patient Stated   On track  ?  Maintain current health ?  ? ?  ? ?Depression Screen ? ?  07/02/2021  ? 11:43 AM 12/07/2020  ?  2:49 PM 10/02/2020  ?  8:45 AM 06/15/2020  ? 10:29 AM 05/05/2020  ? 10:02 AM 06/10/2019  ?  3:12 PM 06/05/2018  ? 11:25 AM  ?PHQ 2/9 Scores  ?PHQ - 2 Score 0 0 0 0 0 0 0  ?  ?Fall Risk ? ?  07/02/2021  ? 11:43 AM 12/07/2020  ?  2:49 PM 10/02/2020  ?  8:46 AM 06/15/2020  ? 10:28 AM 05/05/2020  ?  9:25 AM  ?Fall Risk   ?Falls in the past year? 0 0 0 0 0  ?Number falls in past yr: 0 0 0 0 0  ?Injury with Fall? 0 0 0 0 0  ?Risk for fall due to : No Fall Risks      ?Follow up  Falls evaluation completed  Falls prevention discussed   ? ? ?FALL RISK PREVENTION PERTAINING TO THE HOME: ? ?Any stairs in or around the home? Yes  ?If so, are there any without handrails? No  ?Home free of loose throw rugs in walkways, pet beds, electrical cords, etc? Yes  ?Adequate lighting in your home to reduce risk of falls? Yes  ? ?ASSISTIVE DEVICES UTILIZED TO PREVENT FALLS: ? ?Life alert? No  ?Use of a cane, walker or w/c? No  ?Grab bars in the bathroom? No  ?Shower chair or bench in shower? Yes  ?Elevated toilet seat or a handicapped toilet? No  ? ?TIMED UP AND GO: ? ?Was the test performed? No .  ? ? ?Cognitive Function: ? ?  05/26/2017  ?  8:30 AM  ?MMSE - Mini Mental State Exam  ?Orientation to time 5  ?Orientation to Place 5  ?Registration 3  ?Attention/ Calculation 5  ?Recall 3  ?Language- name 2 objects 2  ?Language- repeat 1  ?Language- follow 3 step  command 3  ?Language- read & follow direction 1  ?Write a sentence 1  ?Copy design 1  ?Total score 30  ? ?  ? ?  07/02/2021  ? 11:48 AM  ?6CIT Screen  ?What Year? 0 points  ?What month? 0 points  ?What time

## 2021-08-08 ENCOUNTER — Other Ambulatory Visit (HOSPITAL_BASED_OUTPATIENT_CLINIC_OR_DEPARTMENT_OTHER): Payer: Self-pay | Admitting: Internal Medicine

## 2021-08-08 DIAGNOSIS — Z1231 Encounter for screening mammogram for malignant neoplasm of breast: Secondary | ICD-10-CM

## 2021-08-17 ENCOUNTER — Ambulatory Visit: Payer: Medicare Other | Admitting: Internal Medicine

## 2021-08-17 ENCOUNTER — Encounter: Payer: Self-pay | Admitting: Internal Medicine

## 2021-08-17 ENCOUNTER — Other Ambulatory Visit: Payer: Self-pay | Admitting: Internal Medicine

## 2021-08-17 VITALS — BP 116/72 | HR 74 | Ht 64.0 in | Wt 158.0 lb

## 2021-08-17 DIAGNOSIS — M816 Localized osteoporosis [Lequesne]: Secondary | ICD-10-CM

## 2021-08-17 LAB — COMPREHENSIVE METABOLIC PANEL
ALT: 18 U/L (ref 0–35)
AST: 20 U/L (ref 0–37)
Albumin: 4.3 g/dL (ref 3.5–5.2)
Alkaline Phosphatase: 69 U/L (ref 39–117)
BUN: 14 mg/dL (ref 6–23)
CO2: 28 mEq/L (ref 19–32)
Calcium: 9.5 mg/dL (ref 8.4–10.5)
Chloride: 103 mEq/L (ref 96–112)
Creatinine, Ser: 0.69 mg/dL (ref 0.40–1.20)
GFR: 85.62 mL/min (ref >60.00)
Glucose, Bld: 95 mg/dL (ref 70–99)
Potassium: 4 mEq/L (ref 3.5–5.1)
Sodium: 140 mEq/L (ref 135–145)
Total Bilirubin: 1.4 mg/dL — ABNORMAL HIGH (ref 0.2–1.2)
Total Protein: 6.8 g/dL (ref 6.0–8.3)

## 2021-08-17 LAB — T4, FREE: Free T4: 0.84 ng/dL (ref 0.60–1.60)

## 2021-08-17 LAB — TSH: TSH: 1.29 u[IU]/mL (ref 0.35–5.50)

## 2021-08-17 LAB — VITAMIN D 25 HYDROXY (VIT D DEFICIENCY, FRACTURES): VITD: 70.14 ng/mL (ref 30.00–100.00)

## 2021-08-17 NOTE — Progress Notes (Signed)
Name: Alice Howard Powless  MRN/ DOB: [id number], 04-27-1945    Age/ Sex: 76 y.o., female    PCP: Wanda PlumpPaz, Jose E, MD   Reason for Endocrinology Evaluation: Osteoporosis      Date of Initial Endocrinology Evaluation: 08/17/2021     HPI: Ms. Alice Howard is a 76 y.o. female with a past medical history of OA and Osteoporosis . The patient presented for initial endocrinology clinic visit on 08/17/2021 for consultative assistance with her Osteoporosis .   Pt was diagnosed with osteoporosis: Dx  > 15 yrs ago . Recent DXA  AP spine T-score -2.9   Menarche at age : [age] Menopausal at age : [age] Fracture Hx: left knee injury due to skiing injury  Hx of HRT: no FH of osteoporosis or hip fracture: no Prior Hx of anti-estrogenic therapy :no Prior Hx of anti-resorptive therapy : She was on Boniva, and alendronate. She tried Prolia 2 injections before 2020 and another one in 2022 but causes extreme joint pain  including joint pain   She does have osteoarthritis   She does water aerobic with weights   She is on Vitamin D3 with K every other day  She is not on calcium      HISTORY:  Past Medical History:  Past Medical History:  Diagnosis Date   Allergic rhinitis    Baker's cyst    at the L per MRI ordered by ortho aprox 2008 patient thinks is B   History of colonic polyps    multiple Cscopes,   Hyperlipidemia    OA (osteoarthritis)    sees Ortho, 2011 they talked about TKR L   Osteoporosis    Vertigo    Past Surgical History:  Past Surgical History:  Procedure Laterality Date   HEMORRHOID SURGERY  1984   KNEE SURGERY  1994   due to skiing accident.  Has 2 screws in knee. Gets Euflexxa injections.   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   Procedure: LEFT TOTAL KNEE ARTHROPLASTY WITH HARDWARE REMOVAL;  Surgeon: Loanne DrillingFrank V Aluisio, MD;  Location: [facility];  Service: Orthopedics;  Laterality: Left;   TUBAL LIGATION      Social History:  reports that she has never smoked. She has never used  smokeless tobacco. She reports current alcohol use. She reports that she does not use drugs. Family History: family history includes Cancer (age of onset: 1055) in her father; Colon cancer (age of onset: 355) in her mother; Heart attack (age of onset: 6570) in an other family member; Hypertension in her mother.   HOME MEDICATIONS: Allergies as of 08/17/2021   No Known Allergies      Medication List        Accurate as of August 17, 2021 10:03 AM. If you have any questions, ask your nurse or doctor.          azelastine 0.1 % nasal spray Commonly known as: ASTELIN Place 2 sprays into both nostrils at bedtime as needed for rhinitis. Use in each nostril as directed   fluticasone 50 MCG/ACT nasal spray Commonly known as: FLONASE SHAKE LIQUID AND USE 2 SPRAYS IN EACH NOSTRIL DAILY   MULTIVITAMIN PO Take 1 tablet by mouth daily.   simvastatin 40 MG tablet Commonly known as: ZOCOR Take 1 tablet (40 mg total) by mouth at bedtime.   VITAMIN D-3 PO Take by mouth daily.          REVIEW OF SYSTEMS: A comprehensive ROS was conducted with the  patient and is negative except as per HPI and below:  ROS     OBJECTIVE:  VS: There were no vitals taken for this visit.   Wt Readings from Last 3 Encounters:  12/07/20 155 lb 2 oz (70.4 kg)  10/02/20 154 lb (69.9 kg)  06/15/20 157 lb (71.2 kg)     EXAM: General: Pt appears well and is in NAD  Neck: General: Supple without adenopathy. Thyroid: Thyroid size normal.  No goiter or nodules appreciated.   Lungs: Clear with good BS bilat with no rales, rhonchi, or wheezes  Heart: Auscultation: RRR.  Abdomen: Normoactive bowel sounds, soft, nontender, without masses or organomegaly palpable  Extremities:  BL LE: No pretibial edema normal ROM and strength.  Mental Status: Judgment, insight: Intact Orientation: Oriented to time, place, and person Mood and affect: No depression, anxiety, or agitation     DATA REVIEWED:     Latest  Reference Range & Units 08/17/21 14:13  Sodium 135 - 145 mEq/L 140  Potassium 3.5 - 5.1 mEq/L 4.0  Chloride 96 - 112 mEq/L 103  CO2 19 - 32 mEq/L 28  Glucose 70 - 99 mg/dL 95  BUN 6 - 23 mg/dL 14  Creatinine 3.74 - 8.27 mg/dL 0.78  Calcium 8.4 - 67.5 mg/dL 9.5  Alkaline Phosphatase 39 - 117 U/L 69  Albumin 3.5 - 5.2 g/dL 4.3  AST 0 - 37 U/L 20  ALT 0 - 35 U/L 18  Total Protein 6.0 - 8.3 g/dL 6.8  Total Bilirubin 0.2 - 1.2 mg/dL 1.4 (H)  GFR >44.92 mL/min 85.62    Latest Reference Range & Units 08/17/21 14:13  VITD 30.00 - 100.00 ng/mL 70.14    Latest Reference Range & Units 08/17/21 14:13  TSH 0.35 - 5.50 uIU/mL 1.29  T4,Free(Direct) 0.60 - 1.60 ng/dL 0.10       DXA 0/71/2197  The BMD measured at AP Spine L1-L4 is 0.830 g/cm2 with a T-score of -2.9. This patient is considered osteoporotic according to World Health Organization [facility]) criteria. The scan quality is good.   Site Region Measured Date Measured Age WHO YA BMD Classification T-score   AP Spine L1-L4 07/20/2018 71.1 Osteoporosis -2.9 0.830 g/cm2 AP Spine L1-L4 06/09/2015 68.0 Osteopenia -2.2 0.914 g/cm2   DualFemur Total Mean 07/20/2018 71.1 Osteopenia -2.2 0.727 g/cm2 DualFemur Total Mean 06/09/2015 68.0 Osteopenia -1.8 0.775 g/cm2   Left Forearm Radius 33% 07/20/2018 71.1 Osteoporosis -2.8 0.628 g/cm2  ASSESSMENT/PLAN/RECOMMENDATIONS:   Osteoporosis :   -Patient has been on variable antiresorptive therapy to include Boniva, Fosamax, as well as Prolia -She does not like Fosamax -Prolia caused severe joint aches and pains, she only tried 2 injections -Patient is skeptical about medications, we discussed her increased risk for bone fracture and we will have to weigh the risk versus the benefit and at this time the benefit outweighs the risk -We also discussed other medications such as PTH analog that will be taking injectable daily, we also discussed Evenity that is once a month injection -At this time I  have encouraged her to continue with weightbearing exercises -We also emphasized the importance of optimizing her calcium and vitamin D intake, historically she has not been on calcium tablets, she does eat occasional yogurt and cheese.  I have advised her that she needs to take around 1200 mg of calcium daily whether this is to her diet or 2 tablets -She will be scheduled for a repeat bone density at med [facility] and we  will discuss treatment options -We will proceed with blood work to rule out secondary causes, so far her calcium is normal as well as vitamin D and TFTs  Medications : Calcium 1200 mg daily Continue vitamin D every other day   Follow-up in 6 months  Signed electronically by: Lyndle Herrlich, MD  [facility] Endocrinology  [facility] [address] [place], [place] [address] Phone: [phone] FAX: [phone]   CC: Wanda Plump, MD [address] [place] [address] Phone: [phone] Fax: [phone]   Return to Endocrinology clinic as below: Future Appointments  Date Time Provider Department Center  08/17/2021  1:20 PM Iman Reinertsen, Konrad Dolores, MD [facility] None  10/01/2021  1:20 PM [facility]  10/03/2021  1:40 PM Wanda Plump, MD [facility]

## 2021-08-17 NOTE — Patient Instructions (Addendum)
Please consume 1200 mg of calcium daily ( you can do some through tablets and some through diet) Continue Vitamin D     24-Hour Urine Collection  You will be collecting your urine for a 24-hour period of time. Your timer starts with your first urine of the morning (For example - If you first pee at 9AM, your timer will start at 9AM) Throw away your first urine of the morning Collect your urine every time you pee for the next 24 hours STOP your urine collection 24 hours after you started the collection (For example - You would stop at 9AM the day after you started)

## 2021-08-20 ENCOUNTER — Other Ambulatory Visit: Payer: Medicare Other

## 2021-08-20 DIAGNOSIS — M816 Localized osteoporosis [Lequesne]: Secondary | ICD-10-CM | POA: Diagnosis not present

## 2021-08-20 NOTE — Progress Notes (Unsigned)
Start date 08-19-21 Time 7:50am End date 08-20-21 Time 8:05am Total Volume=1,100

## 2021-08-21 LAB — PARATHYROID HORMONE, INTACT (NO CA): PTH: 20 pg/mL (ref 16–77)

## 2021-08-21 LAB — CREATININE, URINE, 24 HOUR: Creatinine, 24H Ur: 1.2 g/(24.h) (ref 0.50–2.15)

## 2021-08-21 LAB — CALCIUM, URINE, 24 HOUR: Calcium, 24H Urine: 339 mg/24 h — ABNORMAL HIGH

## 2021-08-27 ENCOUNTER — Encounter: Payer: Self-pay | Admitting: Internal Medicine

## 2021-09-27 ENCOUNTER — Encounter: Payer: Self-pay | Admitting: Internal Medicine

## 2021-10-01 ENCOUNTER — Ambulatory Visit (HOSPITAL_BASED_OUTPATIENT_CLINIC_OR_DEPARTMENT_OTHER)
Admission: RE | Admit: 2021-10-01 | Discharge: 2021-10-01 | Disposition: A | Discharge status: Home / Self Care | Payer: Medicare Other | Source: Ambulatory Visit | Attending: Internal Medicine | Admitting: Internal Medicine

## 2021-10-01 ENCOUNTER — Encounter (HOSPITAL_BASED_OUTPATIENT_CLINIC_OR_DEPARTMENT_OTHER): Payer: Self-pay

## 2021-10-01 DIAGNOSIS — M816 Localized osteoporosis [Lequesne]: Secondary | ICD-10-CM | POA: Insufficient documentation

## 2021-10-01 DIAGNOSIS — M810 Age-related osteoporosis without current pathological fracture: Secondary | ICD-10-CM | POA: Diagnosis not present

## 2021-10-01 DIAGNOSIS — Z1231 Encounter for screening mammogram for malignant neoplasm of breast: Secondary | ICD-10-CM | POA: Insufficient documentation

## 2021-10-01 DIAGNOSIS — Z780 Asymptomatic menopausal state: Secondary | ICD-10-CM | POA: Diagnosis not present

## 2021-10-01 DIAGNOSIS — M85832 Other specified disorders of bone density and structure, left forearm: Secondary | ICD-10-CM | POA: Diagnosis not present

## 2021-10-03 ENCOUNTER — Encounter: Payer: Self-pay | Admitting: Internal Medicine

## 2021-10-03 ENCOUNTER — Ambulatory Visit (INDEPENDENT_AMBULATORY_CARE_PROVIDER_SITE_OTHER): Payer: Medicare Other | Admitting: Internal Medicine

## 2021-10-03 VITALS — BP 116/74 | HR 81 | Temp 98.6°F | Resp 16 | Ht 64.0 in | Wt 155.0 lb

## 2021-10-03 DIAGNOSIS — Z0000 Encounter for general adult medical examination without abnormal findings: Secondary | ICD-10-CM | POA: Diagnosis not present

## 2021-10-03 DIAGNOSIS — Z23 Encounter for immunization: Secondary | ICD-10-CM | POA: Diagnosis not present

## 2021-10-03 DIAGNOSIS — M810 Age-related osteoporosis without current pathological fracture: Secondary | ICD-10-CM | POA: Diagnosis not present

## 2021-10-03 DIAGNOSIS — E785 Hyperlipidemia, unspecified: Secondary | ICD-10-CM | POA: Diagnosis not present

## 2021-10-03 NOTE — Patient Instructions (Addendum)
Recommend to proceed with covid booster (bivalent) at your pharmacy. Flu shot this fall  Congratulations for your new book.  Take calcium 1200 mg daily Take vitamin D   GO TO THE LAB : Get the blood work     GO TO THE FRONT DESK, PLEASE SCHEDULE YOUR APPOINTMENTS Come back for a physical exam in 1 year

## 2021-10-03 NOTE — Assessment & Plan Note (Signed)
Here for CPX  Hyperlipidemia: On simvastatin, check FLP. Osteoporosis: Now under care of Endo, recent work-up reviewed, she was recommended calcium and vitamin D, I encouraged her to do so, latest DEXA results pending. 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 Congratulated! RTC 1 year

## 2021-10-03 NOTE — Progress Notes (Signed)
Subjective:    Patient ID: Alice Howard, female    DOB: [birth year], 76 y.o.   MRN: [id number]  DOS:  10/03/2021 Type of visit - description: CPX  Since the last office visit is feeling well. Has her usual MSK issues.    Review of Systems  Other than above, a 14 point review of systems is negative       Past Medical History:  Diagnosis Date   Allergic rhinitis    Baker's cyst    at the L per MRI ordered by ortho aprox 2008 patient thinks is B   History of colonic polyps    multiple Cscopes,   Hyperlipidemia    OA (osteoarthritis)    sees Ortho, 2011 they talked about TKR L   Osteoporosis    Vertigo     Past Surgical History:  Procedure Laterality Date   HEMORRHOID SURGERY  1984   KNEE SURGERY  1994   due to skiing accident.  Has 2 screws in knee. Gets Euflexxa injections.   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   Procedure: LEFT TOTAL KNEE ARTHROPLASTY WITH HARDWARE REMOVAL;  Surgeon: Loanne Drilling, MD;  Location: [facility];  Service: Orthopedics;  Laterality: Left;   TUBAL LIGATION     Social History   Tobacco Use   Smoking status: Never   Smokeless tobacco: Never  Substance Use Topics   Alcohol use: Yes    Comment: 2 times a week, wine   Drug use: No     Current Outpatient Medications  Medication Instructions   azelastine (ASTELIN) 0.1 % nasal spray 2 sprays, Each Nare, At bedtime PRN, Use in each nostril as directed   Cholecalciferol (VITAMIN D-3 PO) Oral, Daily   fluticasone (FLONASE) 50 MCG/ACT nasal spray SHAKE LIQUID AND USE 2 SPRAYS IN EACH NOSTRIL DAILY   Multiple Vitamins-Minerals (MULTIVITAMIN PO) 1 tablet, Oral, Daily   simvastatin (ZOCOR) 40 mg, Oral, Daily at bedtime       Objective:   Physical Exam BP 116/74   Pulse 81   Temp 98.6 F (37 C) (Oral)   Resp 16   Ht 5\' 4"  (1.626 m)   Wt 155 lb (70.3 kg)   SpO2 97%   BMI 26.61 kg/m  General: Well developed, NAD, BMI noted Neck: No  thyromegaly  HEENT:  Normocephalic . Face  symmetric, atraumatic Lungs:  CTA B Normal respiratory effort, no intercostal retractions, no accessory muscle use. Heart: RRR,  no murmur.  Abdomen:  Not distended, soft, non-tender. No rebound or rigidity.   Lower extremities: no pretibial edema bilaterally  Skin: Exposed areas without rash. Not pale. Not jaundice Neurologic:  alert & oriented X3.  Speech normal, gait appropriate for age and unassisted.  Transferring slightly difficult due to DJD  Strength symmetric and appropriate for age.  Psych: Cognition and judgment appear intact.  Cooperative with normal attention span and concentration.  Behavior appropriate. No anxious or depressed appearing.     Assessment     Assessment Hyperlipidemia Osteoporosis T score 2009 -2.6, , 2011 -2.4 , 2013 -2.3,  2017 -2.2 Boniva started a holiday 2015 after 5 years. T score 05-2015 -2.2-continue vitamin D T score -2.9 07/20/2018 T score 09-2021: MSK-- DJD, R ankle pain  Baker's cyst cyst, per MRI 2008 R groin -distal RLQ pain on-off,   2013 abdomen and pelvic CT   (-). Saw ortho:  not related to DJD of the hip. Carotid 2014 3-17: < 50%, [place] 3/19: see report, no  further routine [place]   PLAN: Here for CPX  Hyperlipidemia: On simvastatin, check FLP. Osteoporosis: Now under care of Endo, recent work-up reviewed, she was recommended calcium and vitamin D, I encouraged her to do so, latest DEXA results pending. 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 Congratulated! RTC 1 year

## 2021-10-03 NOTE — Assessment & Plan Note (Signed)
-  Td 04-2015 -Pneumonia 2013; prevnar 03-07-14; PNM 20: Today - zostavax 2012;  s/p shingrex  - s/p covid vax x 3, rec booster w/ bivalent   - CCS: 02/14/12 w/ Dr. Madilyn Fireman; 4mm polyp removed from rectum; cscope  04/10/2017,  next 5 years per BX report.   ---saw  gyn June 2023    ---MMG done few days ago ---diet: healthy; exercise: Remains active, some limitation by DJD  ---Labs: Recent labs reviewed.  Check FLP, CBC, - POA on file

## 2021-10-04 LAB — CBC WITH DIFFERENTIAL/PLATELET
Basophils Absolute: 0.1 10*3/uL (ref 0.0–0.1)
Basophils Relative: 0.9 % (ref 0.0–3.0)
Eosinophils Absolute: 0.3 10*3/uL (ref 0.0–0.7)
Eosinophils Relative: 3.5 % (ref 0.0–5.0)
HCT: 39.3 % (ref 36.0–46.0)
Hemoglobin: 13.1 g/dL (ref 12.0–15.0)
Lymphocytes Relative: 20 % (ref 12.0–46.0)
Lymphs Abs: 1.6 10*3/uL (ref 0.7–4.0)
MCHC: 33.2 g/dL (ref 30.0–36.0)
MCV: 94.6 fl (ref 78.0–100.0)
Monocytes Absolute: 0.7 10*3/uL (ref 0.1–1.0)
Monocytes Relative: 8.4 % (ref 3.0–12.0)
Neutro Abs: 5.3 10*3/uL (ref 1.4–7.7)
Neutrophils Relative %: 67.2 % (ref 43.0–77.0)
Platelets: 176 10*3/uL (ref 150.0–400.0)
RBC: 4.16 Mil/uL (ref 3.87–5.11)
RDW: 12.3 % (ref 11.5–15.5)
WBC: 8 10*3/uL (ref 4.0–10.5)

## 2021-10-04 LAB — LIPID PANEL
Cholesterol: 203 mg/dL — ABNORMAL HIGH (ref 0–200)
HDL: 58.9 mg/dL (ref >39.00)
LDL Cholesterol: 118 mg/dL — ABNORMAL HIGH (ref 0–99)
NonHDL: 143.95
Total CHOL/HDL Ratio: 3
Triglycerides: 129 mg/dL (ref 0.0–149.0)
VLDL: 25.8 mg/dL (ref 0.0–40.0)

## 2021-10-05 ENCOUNTER — Encounter: Payer: Self-pay | Admitting: Internal Medicine

## 2021-10-10 ENCOUNTER — Encounter: Payer: Self-pay | Admitting: Internal Medicine

## 2021-10-14 ENCOUNTER — Other Ambulatory Visit: Payer: Self-pay | Admitting: Internal Medicine

## 2022-02-04 ENCOUNTER — Encounter: Payer: Self-pay | Admitting: Internal Medicine

## 2022-02-06 ENCOUNTER — Encounter: Payer: Self-pay | Admitting: Internal Medicine

## 2022-02-06 ENCOUNTER — Ambulatory Visit (INDEPENDENT_AMBULATORY_CARE_PROVIDER_SITE_OTHER): Payer: Medicare Other | Admitting: Internal Medicine

## 2022-02-06 ENCOUNTER — Ambulatory Visit (HOSPITAL_BASED_OUTPATIENT_CLINIC_OR_DEPARTMENT_OTHER)
Admission: RE | Admit: 2022-02-06 | Discharge: 2022-02-06 | Disposition: A | Discharge status: Home / Self Care | Payer: Medicare Other | Source: Ambulatory Visit | Attending: Internal Medicine | Admitting: Internal Medicine

## 2022-02-06 VITALS — BP 116/68 | HR 68 | Temp 98.1°F | Resp 16 | Ht 64.0 in | Wt 157.0 lb

## 2022-02-06 DIAGNOSIS — H814 Vertigo of central origin: Secondary | ICD-10-CM | POA: Insufficient documentation

## 2022-02-06 DIAGNOSIS — J302 Other seasonal allergic rhinitis: Secondary | ICD-10-CM

## 2022-02-06 DIAGNOSIS — R42 Dizziness and giddiness: Secondary | ICD-10-CM | POA: Diagnosis not present

## 2022-02-06 NOTE — Assessment & Plan Note (Signed)
Dizziness: As described above in the context of getting a massage  including the neck.  Although symptoms sound peripheral and she is getting better, I'm concerned about possible vertebral artery damage. Recommend CT head now.  Rest and fluids. If she does not continue to recover or the symptoms resurface: ER, she will need MRI in my opinion. This was carefully discussed with the patient and she verbalized understanding. Going forward: Avoid neck massages. Allergies: She also have some runny nose without fever or green nasal discharge, likely allergies.  Recommend Flonase Allegra.

## 2022-02-06 NOTE — Progress Notes (Signed)
Subjective:    Patient ID: Alice Howard, female    DOB: [birth year], 76 y.o.   MRN: [id number]  DOS:  02/06/2022 Type of visit - description: Acute  Patient flew back from [place] 02/02/2022, the next day she had a full body massage including her neck. At the end of  massage she become dizzy: Described as a spinning sensation, worse when she sit up or move her head.  She vomited 1 time. Symptoms lasted approximately 2-1/2 days. Today she feels better although is not completely recuperated. I ask about neck pain after the massage and she admits to some pain and questionable neck stiffness. She had a headache a couple of times but as soon as she drank coffee the headache went away. She denies double vision, slurred speech, motor deficits.  She denies also chest pain, difficulty breathing.  No lower extremity edema or palpitations.  No calf pain.  Reports some runny nose, on further questions she has been having some runny nose and sinus congestion since September.  No fever.  She thinks is allergies.    Review of Systems See above   Past Medical History:  Diagnosis Date   Allergic rhinitis    Baker's cyst    at the L per MRI ordered by ortho aprox 2008 patient thinks is B   History of colonic polyps    multiple Cscopes,   Hyperlipidemia    OA (osteoarthritis)    sees Ortho, 2011 they talked about TKR L   Osteoporosis    Vertigo     Past Surgical History:  Procedure Laterality Date   HEMORRHOID SURGERY  1984   KNEE SURGERY  1994   due to skiing accident.  Has 2 screws in knee. Gets Euflexxa injections.   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   Procedure: LEFT TOTAL KNEE ARTHROPLASTY WITH HARDWARE REMOVAL;  Surgeon: Loanne Drilling, MD;  Location: [facility];  Service: Orthopedics;  Laterality: Left;   TUBAL LIGATION      Current Outpatient Medications  Medication Instructions   azelastine (ASTELIN) 0.1 % nasal spray 2 sprays, Each Nare, At bedtime PRN, Use in each  nostril as directed   Cholecalciferol (VITAMIN D-3 PO) Oral, Daily   fluticasone (FLONASE) 50 MCG/ACT nasal spray SHAKE LIQUID AND USE 2 SPRAYS IN EACH NOSTRIL DAILY   Multiple Vitamins-Minerals (MULTIVITAMIN PO) 1 tablet, Oral, Daily   simvastatin (ZOCOR) 40 MG tablet TAKE 1 TABLET(40 MG) BY MOUTH AT BEDTIME       Objective:   Physical Exam BP 116/68   Pulse 68   Temp 98.1 F (36.7 C) (Oral)   Resp 16   Ht 5\' 4"  (1.626 m)   Wt 157 lb (71.2 kg)   SpO2 95%   BMI 26.95 kg/m  General:   Well developed, NAD, BMI noted. HEENT:  Normocephalic . Face symmetric, atraumatic Neck: Soft, range of motion normal, no TTP of the cervical spine.  Palpable carotid pulses. Lungs:  CTA B Normal respiratory effort, no intercostal retractions, no accessory muscle use. Heart: RRR,  no murmur.  Lower extremities: no pretibial edema bilaterally.  Calves symmetric, soft, nontender Skin: Not pale. Not jaundice Neurologic:  alert & oriented X3.  Speech normal, gait appropriate for age and unassisted EOMI, motor symmetric. Psych--  Cognition and judgment appear intact.  Cooperative with normal attention span and concentration.  Behavior appropriate. No anxious or depressed appearing.      Assessment     Assessment Hyperlipidemia Osteoporosis T score 2009 -  2.6, , 2011 -2.4 , 2013 -2.3,  2017 -2.2 Boniva started a holiday 2015 after 5 years. T score 05-2015 -2.2-continue vitamin D T score -2.9 07/20/2018 T score 09-2021: MSK-- DJD, R ankle pain  Baker's cyst cyst, per MRI 2008 R groin -distal RLQ pain on-off,   2013 abdomen and pelvic CT   (-). Saw ortho:  not related to DJD of the hip. Carotid US 3-17: < 50%, [place] 3/19: see report, no further routine [place]   PLAN: Dizziness: As described above in the context of getting a massage  including the neck.  Although symptoms sound peripheral and she is getting better, I'm concerned about possible vertebral artery damage. Recommend CT head now.  Rest  and fluids. If she does not continue to recover or the symptoms resurface: ER, she will need MRI in my opinion. This was carefully discussed with the patient and she verbalized understanding. Going forward: Avoid neck massages. Allergies: She also have some runny nose without fever or green nasal discharge, likely allergies.  Recommend Flonase Allegra.

## 2022-02-06 NOTE — Patient Instructions (Signed)
Proceed with a CT scan of your head  Rest and drink plenty of fluids.  If you continue to feel dizzy or poorly in the next few days, go to the ER even if the CAT scan today is normal.  It is likely that you will need a brain MRI if the dizziness continues.  Allergies: Flonase over-the-counter 2 sprays on each side of the nose daily Allegra 60 mg over-the-counter 1 tablet twice daily as needed. Avoid Sudafed.

## 2022-02-22 ENCOUNTER — Telehealth (INDEPENDENT_AMBULATORY_CARE_PROVIDER_SITE_OTHER): Payer: Medicare Other | Admitting: Internal Medicine

## 2022-02-22 VITALS — Ht 64.0 in

## 2022-02-22 DIAGNOSIS — M816 Localized osteoporosis [Lequesne]: Secondary | ICD-10-CM | POA: Diagnosis not present

## 2022-02-22 MED ORDER — ALENDRONATE SODIUM 70 MG PO TABS: 70.0000 mg | ORAL_TABLET | ORAL | 3 refills | Status: DC

## 2022-02-22 NOTE — Patient Instructions (Signed)
Take calcium 500 mg daily  Start Alendronate 70 mg ONCE weekly

## 2022-02-22 NOTE — Progress Notes (Signed)
Virtual Visit via Video Note  I connected with Alice Howard on 02/22/22 at 1 pm  by a video enabled telemedicine application and verified that I am speaking with the correct person using two identifiers.   I discussed the limitations of evaluation and management by telemedicine and the availability of in person appointments. The patient expressed understanding and agreed to proceed.  -Location of the patient :home  -Location of the provider : office -The names of all persons participating in the telemedicine service : Pt and myself     Name: Alice Howard  MRN/ DOB: [id number], 04-15-1945    Age/ Sex: 76 y.o., female    PCP: Wanda Plump, MD   Reason for Endocrinology Evaluation: Osteoporosis      Date of Initial Endocrinology Evaluation: 08/17/2021    HPI: Alice Howard is a 76 y.o. female with a past medical history of OA and Osteoporosis . The patient presented for initial endocrinology clinic visit on 08/17/2021 for consultative assistance with her Osteoporosis .   Pt was diagnosed with osteoporosis: Dx  > 15 yrs ago . Recent DXA  AP spine T-score -2.9   Menarche at age : 57 Menopausal at age : 42 Fracture Hx: left knee injury due to skiing injury  Hx of HRT: no FH of osteoporosis or hip fracture: no Prior Hx of anti-resorptive therapy : She was on Boniva. She tried Prolia 2 injections before 2020 and another one in 2022 but causes extreme joint pain  She does have osteoarthritis    On her initial visit she had normal calcium, vitamin D, GFR and PTH. 24 hr urine calcium elevated at 339 mg  SUBJECTIVE:    Today (02/22/22):  Alice Howard is here for a follow up on osteoporosis management.  She is currently on antihistamine for head congestion She has noted occasional lightheadedness, denies falls Denies heartburn or constipation  She consumes 2 -3 servings of foods that are rich in calcium She continues to be on vitamin D3 with vitamin K every other day She  continues with water aerobic with weights       HISTORY:  Past Medical History:  Past Medical History:  Diagnosis Date   Allergic rhinitis    Baker's cyst    at the L per MRI ordered by ortho aprox 2008 patient thinks is B   History of colonic polyps    multiple Cscopes,   Hyperlipidemia    OA (osteoarthritis)    sees Ortho, 2011 they talked about TKR L   Osteoporosis    Vertigo    Past Surgical History:  Past Surgical History:  Procedure Laterality Date   HEMORRHOID SURGERY  1984   KNEE SURGERY  1994   due to skiing accident.  Has 2 screws in knee. Gets Euflexxa injections.   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   Procedure: LEFT TOTAL KNEE ARTHROPLASTY WITH HARDWARE REMOVAL;  Surgeon: Loanne Drilling, MD;  Location: [facility];  Service: Orthopedics;  Laterality: Left;   TUBAL LIGATION      Social History:  reports that she has never smoked. She has never used smokeless tobacco. She reports current alcohol use. She reports that she does not use drugs. Family History: family history includes Cancer (age of onset: 44) in her father; Colon cancer (age of onset: 55) in her mother; Heart attack (age of onset: 87) in an other family member; Hypertension in her mother.   HOME MEDICATIONS: Allergies as of 02/22/2022  No Known Allergies      Medication List        Accurate as of February 22, 2022  9:12 AM. If you have any questions, ask your nurse or doctor.          fluticasone 50 MCG/ACT nasal spray Commonly known as: FLONASE SHAKE LIQUID AND USE 2 SPRAYS IN EACH NOSTRIL DAILY   MULTIVITAMIN PO Take 1 tablet by mouth daily.   simvastatin 40 MG tablet Commonly known as: ZOCOR TAKE 1 TABLET(40 MG) BY MOUTH AT BEDTIME   VITAMIN D-3 PO Take by mouth daily.          REVIEW OF SYSTEMS: A comprehensive ROS was conducted with the patient and is negative except as per HPI    OBJECTIVE:   Wt Readings from Last 3 Encounters:  02/06/22 157 lb (71.2 kg)   10/03/21 155 lb (70.3 kg)  08/17/21 158 lb (71.7 kg)     EXAM: General: Pt appears well and is in NAD  Mental Status: Judgment, insight: Intact Orientation: Oriented to time, place, and person Mood and affect: No depression, anxiety, or agitation     DATA REVIEWED:     Latest Reference Range & Units 08/17/21 14:13  Sodium 135 - 145 mEq/L 140  Potassium 3.5 - 5.1 mEq/L 4.0  Chloride 96 - 112 mEq/L 103  CO2 19 - 32 mEq/L 28  Glucose 70 - 99 mg/dL 95  BUN 6 - 23 mg/dL 14  Creatinine 5.64 - 3.32 mg/dL 9.51  Calcium 8.4 - 88.4 mg/dL 9.5  Alkaline Phosphatase 39 - 117 U/L 69  Albumin 3.5 - 5.2 g/dL 4.3  AST 0 - 37 U/L 20  ALT 0 - 35 U/L 18  Total Protein 6.0 - 8.3 g/dL 6.8  Total Bilirubin 0.2 - 1.2 mg/dL 1.4 (H)  GFR >16.60 mL/min 85.62    Latest Reference Range & Units 08/17/21 14:13  VITD 30.00 - 100.00 ng/mL 70.14    Latest Reference Range & Units 08/17/21 14:13  TSH 0.35 - 5.50 uIU/mL 1.29  T4,Free(Direct) 0.60 - 1.60 ng/dL 6.30    Latest Reference Range & Units 08/20/21 11:53  Calcium, 24H Urine mg/24 h 339 (H)  Creatinine, 24H Ur 0.50 - 2.15 g/24 h 1.20    DXA 10/04/2021   AP LUMBAR SPINE L1 through L4   Bone Mineral Density (BMD):  0.855 g/cm2   Young Adult T-Score:  -2.7   Z-Score:  -1.0   RIGHT FEMUR TOTAL   Bone Mineral Density (BMD):  0.6 x 4 g/cm2   Young Adult T-Score: -2.8   Z-Score:  -1.1   LEFT FOREARM (1/3 RADIUS)   Bone Mineral Density (BMD):  0.672   Young Adult T Score:  -2.3   Z Score:  -0.1     ASSESSMENT: Patient's diagnostic category is OSTEOPOROSIS by WHO Criteria.     COMPARISON: 07/20/2018 and 06/09/2015. Since the most recent prior exam there has been no statistically significant change in bone density for the lumbar spine or for the total mean proximal femurs. There has been a 7% increase in density for the distal third of the left radius.  DXA 07/20/2018  The BMD measured at AP Spine L1-L4 is 0.830 g/cm2  with a T-score of -2.9. This patient is considered osteoporotic according to World Health Organization [facility]) criteria. The scan quality is good.   Site Region Measured Date Measured Age WHO YA BMD Classification T-score   AP Spine L1-L4 07/20/2018 71.1 Osteoporosis -2.9 0.830 g/cm2  AP Spine L1-L4 06/09/2015 68.0 Osteopenia -2.2 0.914 g/cm2   DualFemur Total Mean 07/20/2018 71.1 Osteopenia -2.2 0.727 g/cm2 DualFemur Total Mean 06/09/2015 68.0 Osteopenia -1.8 0.775 g/cm2   Left Forearm Radius 33% 07/20/2018 71.1 Osteoporosis -2.8 0.628 g/cm2  ASSESSMENT/PLAN/RECOMMENDATIONS:   Osteoporosis :   -Patient has been on variable antiresorptive therapy to include Boniva, as well as Prolia -Prolia caused severe joint aches and pains -We reviewed the results of most DXA scan showing osteoporosis despite stability I have recommended antiresorptive therapy -Patient agreed to try alendronate, she does not recall being on this in the past -We also emphasized the importance of optimizing her calcium and vitamin D intake   Medications : Calcium 500 mg daily Continue vitamin D every other day Start alendronate 70 mg weekly   Follow-up in 6 months  Signed electronically by: Lyndle Herrlich, MD  [facility] Endocrinology  [facility] [address] [place], [place] [address] Phone: [phone] FAX: [phone]   CC: Wanda Plump, MD [address] [place] [address] Phone: [phone] Fax: [phone]   Return to Endocrinology clinic as below: Future Appointments  Date Time Provider Department Center  02/22/2022  1:00 PM Nahuel Wilbert, Konrad Dolores, MD [facility] None  07/04/2022  9:00 AM [facility]  10/07/2022  8:00 AM Wanda Plump, MD [facility]

## 2022-03-22 ENCOUNTER — Encounter: Payer: Self-pay | Admitting: Internal Medicine

## 2022-03-25 ENCOUNTER — Telehealth: Payer: Self-pay | Admitting: Internal Medicine

## 2022-03-26 ENCOUNTER — Ambulatory Visit: Payer: Medicare Other | Admitting: Internal Medicine

## 2022-03-26 ENCOUNTER — Encounter: Payer: Self-pay | Admitting: Internal Medicine

## 2022-03-26 VITALS — BP 126/80 | HR 70 | Temp 98.0°F | Resp 16 | Ht 64.0 in | Wt 158.1 lb

## 2022-03-26 DIAGNOSIS — J019 Acute sinusitis, unspecified: Secondary | ICD-10-CM | POA: Diagnosis not present

## 2022-03-26 MED ORDER — AMOXICILLIN-POT CLAVULANATE 875-125 MG PO TABS: 1.0000 | ORAL_TABLET | Freq: Two times a day (BID) | ORAL | 0 refills | Status: DC

## 2022-03-26 MED ORDER — PREDNISONE 10 MG PO TABS: ORAL_TABLET | ORAL | 0 refills | Status: DC

## 2022-03-26 NOTE — Patient Instructions (Signed)
Take prednisone as prescribed for few days  Take the antibiotic Augmentin for 10 days  Hold Flonase, try Astepro 2 sprays on each side of the nose daily (OTC, no prescription needed)  If you are not gradually better please reach out.

## 2022-03-26 NOTE — Progress Notes (Signed)
Subjective:    Patient ID: Alice Howard, female    DOB: 1945/07/15, 77 y.o.   MRN: [id number]  DOS:  03/26/2022 Type of visit - description: Acute  Symptoms started approximately 6 weeks ago: She reports her head feels full, she has pressure behind the eyes and behind the nose. Have sinus congestion, however she is unable to blow any discharge from the nose. Denies any itchy eyes or sneezing No nausea or vomiting Minimal cough if any No chest congestion No neck pain Currently using Flonase and Zyrtec without major improvement.  Review of Systems See above   Past Medical History:  Diagnosis Date   Allergic rhinitis    Baker's cyst    at the L per MRI ordered by ortho aprox 2008 patient thinks is B   History of colonic polyps    multiple Cscopes,   Hyperlipidemia    OA (osteoarthritis)    sees Ortho, 2011 they talked about TKR L   Osteoporosis    Vertigo     Past Surgical History:  Procedure Laterality Date   [place]   due to skiing accident.  Has 2 screws in knee. Gets Euflexxa injections.   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   Procedure: LEFT TOTAL KNEE ARTHROPLASTY WITH HARDWARE REMOVAL;  Surgeon: Gearlean Alf, MD;  Location: [facility];  Service: Orthopedics;  Laterality: Left;   TUBAL LIGATION      Current Outpatient Medications  Medication Instructions   alendronate (FOSAMAX) 70 mg, Oral, Every 7 days, Take with a full glass of water on an empty stomach.   cetirizine (ZYRTEC) 10 mg, Oral, Daily   Cholecalciferol (VITAMIN D-3 PO) Oral, Daily   fluticasone (FLONASE) 50 MCG/ACT nasal spray SHAKE LIQUID AND USE 2 SPRAYS IN EACH NOSTRIL DAILY   Multiple Vitamins-Minerals (MULTIVITAMIN PO) 1 tablet, Oral, Daily   simvastatin (ZOCOR) 40 MG tablet TAKE 1 TABLET(40 MG) BY MOUTH AT BEDTIME       Objective:   Physical Exam BP 126/80   Pulse 70   Temp 98 F (36.7 C) (Oral)   Resp 16   Ht 5\' 4"  (1.626 m)   Wt 158 lb 2  oz (71.7 kg)   SpO2 98%   BMI 27.14 kg/m  General:   Well developed, NAD, BMI noted. HEENT:  Normocephalic . Face symmetric, atraumatic.  EOMI. Nose slightly congested, sinuses slightly TTP bilaterally. Throat symmetric, no red. TMs: Unable to see, canals are very small and narrow. Lungs:  CTA B Normal respiratory effort, no intercostal retractions, no accessory muscle use. Heart: RRR,  no murmur.  Lower extremities: no pretibial edema bilaterally  Skin: Not pale. Not jaundice Neurologic:  alert & oriented X3.  Speech normal, gait appropriate for age and unassisted Psych--  Cognition and judgment appear intact.  Cooperative with normal attention span and concentration.  Behavior appropriate. No anxious or depressed appearing.      Assessment     Assessment Hyperlipidemia Osteoporosis T score 2009 -2.6, , 2011 -2.4 , 2013 -2.3,  2017 -2.2 Boniva started a holiday 2015 after 5 years. T score 05-2015 -2.2-continue vitamin D T score -2.9 07/20/2018 T score 09-2021:-2.7  MSK-- DJD, R ankle pain  Baker's cyst cyst, per MRI 2008 R groin -distal RLQ pain on-off,   2013 abdomen and pelvic CT   (-). Saw ortho:  not related to DJD of the hip. Carotid US 3-17:  < 50%, [place] 3/19: see report,  no further routine [place]   PLAN: Sinusitis: Subacute sinusitis going on for about 6 weeks, she did have a CT of the head done in November for dizziness (which is now resolved) and it showed mucosal thickening in a partially covered sinuses. Plan: Augmentin x 10 days for presumed sinusitis, continue antihistaminics, Flonase not helping, switch to Astepro. Round of prednisone If not better in the next 2 to 3 weeks, she will let me know.  ENT referral? Osteoporosis: Seen by Endo 02/22/2022, they noted she try Boniva in the past Prolia cause side effects. Was recommended alendronate, calcium and vitamin D.

## 2022-03-27 NOTE — Assessment & Plan Note (Signed)
Sinusitis: Subacute sinusitis going on for about 6 weeks, she did have a CT of the head done in November for dizziness (which is now resolved) and it showed mucosal thickening in a partially covered sinuses. Plan: Augmentin x 10 days for presumed sinusitis, continue antihistaminics, Flonase not helping, switch to Astepro. Round of prednisone If not better in the next 2 to 3 weeks, she will let me know.  ENT referral? Osteoporosis: Seen by Endo 02/22/2022, they noted she try Boniva in the past Prolia cause side effects. Was recommended alendronate, calcium and vitamin D.

## 2022-04-06 ENCOUNTER — Encounter: Payer: Self-pay | Admitting: Internal Medicine

## 2022-04-06 DIAGNOSIS — J329 Chronic sinusitis, unspecified: Secondary | ICD-10-CM

## 2022-04-09 MED ORDER — PREDNISONE 10 MG PO TABS: ORAL_TABLET | ORAL | 0 refills | Status: DC

## 2022-04-09 NOTE — Telephone Encounter (Signed)
ENT referral placed.

## 2022-04-09 NOTE — Addendum Note (Signed)
Addended by: Damita Dunnings D on: 04/09/2022 11:19 AM   Modules accepted: Orders

## 2022-04-09 NOTE — Telephone Encounter (Signed)
Please arrange ENT referral, persistent sinusitis

## 2022-04-30 DIAGNOSIS — L570 Actinic keratosis: Secondary | ICD-10-CM | POA: Diagnosis not present

## 2022-04-30 DIAGNOSIS — D485 Neoplasm of uncertain behavior of skin: Secondary | ICD-10-CM | POA: Diagnosis not present

## 2022-05-02 ENCOUNTER — Encounter: Payer: Self-pay | Admitting: Internal Medicine

## 2022-05-27 DIAGNOSIS — G44219 Episodic tension-type headache, not intractable: Secondary | ICD-10-CM | POA: Diagnosis not present

## 2022-05-30 DIAGNOSIS — K080 Exfoliation of teeth due to systemic causes: Secondary | ICD-10-CM | POA: Diagnosis not present

## 2022-06-21 DIAGNOSIS — J301 Allergic rhinitis due to pollen: Secondary | ICD-10-CM | POA: Diagnosis not present

## 2022-07-04 ENCOUNTER — Ambulatory Visit (INDEPENDENT_AMBULATORY_CARE_PROVIDER_SITE_OTHER): Payer: Medicare Other | Admitting: *Deleted

## 2022-07-04 DIAGNOSIS — Z0000 Encounter for general adult medical examination without abnormal findings: Secondary | ICD-10-CM | POA: Diagnosis not present

## 2022-07-04 NOTE — Progress Notes (Signed)
Subjective:   Alice Howard is a 77 y.o. female who presents for Medicare Annual (Subsequent) preventive examination.  I connected with  Alice Howard on 07/04/22 by a audio enabled telemedicine application and verified that I am speaking with the correct person using two identifiers.  Patient Location: Home  Provider Location: Office/Clinic  I discussed the limitations of evaluation and management by telemedicine. The patient expressed understanding and agreed to proceed.   Review of Systems     Cardiac Risk Factors include: advanced age (>7men, >42 women);dyslipidemia     Objective:    There were no vitals filed for this visit. There is no height or weight on file to calculate BMI.     07/04/2022    9:03 AM 07/02/2021   11:47 AM 06/15/2020   10:25 AM 06/10/2019    3:10 PM 06/05/2018   11:24 AM 05/26/2017    8:29 AM 05/10/2016    8:44 AM  Advanced Directives  Does Patient Have a Medical Advance Directive? Yes Yes Yes Yes Yes Yes Yes  Type of Estate agent of [place];Living will Healthcare Power of [place];Living will;Out of facility DNR (pink MOST or yellow form) Healthcare Power of [place];Living will Healthcare Power of [place];Living will Healthcare Power of [place];Living will Healthcare Power of [place];Living will Healthcare Power of [place];Living will  Does patient want to make changes to medical advance directive? No - Patient declined   No - Patient declined No - Patient declined No - Patient declined   Copy of Healthcare Power of Attorney in Chart? Yes - validated most recent copy scanned in chart (See row information) Yes - validated most recent copy scanned in chart (See row information) Yes - validated most recent copy scanned in chart (See row information) No - copy requested No - copy requested No - copy requested No - copy requested    Current Medications (verified) Outpatient Encounter Medications as of 07/04/2022  Medication Sig    fexofenadine (ALLEGRA ALLERGY) 60 MG tablet Take 60 mg by mouth 2 (two) times daily.   alendronate (FOSAMAX) 70 MG tablet Take 1 tablet (70 mg total) by mouth every 7 (seven) days. Take with a full glass of water on an empty stomach.   Cholecalciferol (VITAMIN D-3 PO) Take by mouth daily.   fluticasone (FLONASE) 50 MCG/ACT nasal spray SHAKE LIQUID AND USE 2 SPRAYS IN EACH NOSTRIL DAILY   Multiple Vitamins-Minerals (MULTIVITAMIN PO) Take 1 tablet by mouth daily.   simvastatin (ZOCOR) 40 MG tablet TAKE 1 TABLET(40 MG) BY MOUTH AT BEDTIME   [DISCONTINUED] amoxicillin-clavulanate (AUGMENTIN) 875-125 MG tablet Take 1 tablet by mouth 2 (two) times daily.   [DISCONTINUED] cetirizine (ZYRTEC) 10 MG tablet Take 10 mg by mouth daily.   [DISCONTINUED] predniSONE (DELTASONE) 10 MG tablet 3 tabs x 3 days, 2 tabs x 3 days, 1 tab x 3 days   No facility-administered encounter medications on file as of 07/04/2022.    Allergies (verified) Patient has no known allergies.   History: Past Medical History:  Diagnosis Date   Allergic rhinitis    Baker's cyst    at the L per MRI ordered by ortho aprox 2008 patient thinks is B   History of colonic polyps    multiple Cscopes,   Hyperlipidemia    OA (osteoarthritis)    sees Ortho, 2011 they talked about TKR L   Osteoporosis    Vertigo    Past Surgical History:  Procedure Laterality Date   HEMORRHOID SURGERY  1984   JOINT REPLACEMENT     KNEE SURGERY  1994   due to skiing accident.  Has 2 screws in knee. Gets Euflexxa injections.   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 similar to leukemia"    Heart attack Other 34       GMx 2    Breast cancer Neg Hx    Diabetes Neg Hx    Social History   Socioeconomic History   Marital status: Married    Spouse name: Not on file   Number of children: 2   Years of education: Not on file   Highest education level: Not on file  Occupational History   Occupation: Engineer, maintenance (IT)-- retired 2012     Employer: [employer]  Tobacco Use   Smoking status: Never   Smokeless tobacco: Never  Substance and Sexual Activity   Alcohol use: Yes    Alcohol/week: 2.0 standard drinks of alcohol    Types: 2 Glasses of wine per week    Comment: 2 times a week, wine   Drug use: No   Sexual activity: Yes  Other Topics Concern   Not on file  Social History Narrative   Household-- pt and husband, healthy marriage   3 g-children     daughter lives in [place]      Social Determinants of Health   Financial Resource Strain: Low Risk  (07/02/2021)   Overall Financial Resource Strain (CARDIA)    Difficulty of Paying Living Expenses: Not hard at all  Food Insecurity: No Food Insecurity (07/04/2022)   Hunger Vital Sign    Worried About Running Out of Food in the Last Year: Never true    Ran Out of Food in the Last Year: Never true  Transportation Needs: No Transportation Needs (07/04/2022)   PRAPARE - Administrator, Civil Service (Medical): No    Lack of Transportation (Non-Medical): No  Physical Activity: Sufficiently Active (07/02/2021)   Exercise Vital Sign    Days of Exercise per Week: 7 days    Minutes of Exercise per Session: 60 min  Stress: No Stress Concern Present (07/02/2021)   [employer] of Occupational Health - Occupational Stress Questionnaire    Feeling of Stress : Not at all  Social Connections: Socially Integrated (07/02/2021)   Social Connection and Isolation Panel [NHANES]    Frequency of Communication with Friends and Family: More than three times a week    Frequency of Social Gatherings with  Friends and Family: More than three times a week    Attends Religious Services: More than 4 times per year    Active Member of [employer] or Organizations: Yes    Attends Engineer, structural: More than 4 times per year    Marital Status: Married    Tobacco Counseling Counseling given: Not Answered   Clinical Intake:  Pre-visit preparation completed: Yes  Pain : No/denies pain  Nutritional Risks: None Diabetes: No  How often do you need to have someone help you when you read instructions, pamphlets, or other  written materials from your doctor or pharmacy?: 1 - Never  Activities of Daily Living    07/04/2022    9:08 AM  In your present state of health, do you have any difficulty performing the following activities:  Hearing? 0  Vision? 0  Difficulty concentrating or making decisions? 0  Walking or climbing stairs? 0  Dressing or bathing? 0  Doing errands, shopping? 0  Preparing Food and eating ? N  Using the Toilet? N  In the past six months, have you accidently leaked urine? N  Do you have problems with loss of bowel control? N  Managing your Medications? N  Managing your Finances? N  Housekeeping or managing your Housekeeping? N    Patient Care Team: Wanda Plump, MD as PCP - General [place], Tawanna Cooler, MD as Consulting Physician (Obstetrics and Gynecology) Toni Arthurs, MD as Consulting Physician (Orthopedic Surgery) Ulice Bold, [facility] (Inactive) as Counselor (Psychology)  Indicate any recent Medical Services you may have received from other than [facility] providers in the past year (date may be approximate).     Assessment:   This is a routine wellness examination for [place].  Hearing/Vision screen No results found.  Dietary issues and exercise activities discussed: Current Exercise Habits: Home exercise routine, Type of exercise: Other - see comments (water aerobics), Time (Minutes): 60, Frequency (Times/Week): 4, Weekly Exercise (Minutes/Week): 240,  Intensity: Moderate, Exercise limited by: None identified   Goals Addressed   None    Depression Screen    07/04/2022    9:07 AM 03/26/2022    3:42 PM 02/06/2022    8:18 AM 10/03/2021    1:40 PM 07/02/2021   11:43 AM 12/07/2020    2:49 PM 10/02/2020    8:45 AM  PHQ 2/9 Scores  PHQ - 2 Score 0 0 0 0 0 0 0    Fall Risk    07/04/2022    9:04 AM 03/26/2022    3:42 PM 02/06/2022    8:18 AM 10/03/2021    1:39 PM 07/02/2021   11:43 AM  Fall Risk   Falls in the past year? 0 0 0 0 0  Number falls in past yr: 0 0 0 0 0  Injury with Fall? 0 0 0 0 0  Risk for fall due to : No Fall Risks    No Fall Risks  Follow up Falls evaluation completed Falls evaluation completed Falls evaluation completed Falls evaluation completed     FALL RISK PREVENTION PERTAINING TO THE HOME:  Any stairs in or around the home? Yes  If so, are there any without handrails? No  Home free of loose throw rugs in walkways, pet beds, electrical cords, etc? Yes  Adequate lighting in your home to reduce risk of falls? Yes   ASSISTIVE DEVICES UTILIZED TO PREVENT FALLS:  Life alert? No  Use of a cane, walker or w/c? No  Grab bars in the bathroom? No  Shower chair or bench in shower? No  Elevated toilet seat or a handicapped toilet? No   TIMED UP AND GO:  Was the test performed?  No, audio visit .   Cognitive Function:    05/26/2017    8:30 AM  MMSE - Mini Mental State Exam  Orientation to time 5  Orientation to Place 5  Registration 3  Attention/ Calculation 5  Recall 3  Language- name 2 objects 2  Language- repeat 1  Language- follow 3 step command 3  Language- read & follow direction 1  Write a sentence 1  Copy design 1  Total score 30        07/04/2022    9:13 AM 07/02/2021   11:48 AM  6CIT Screen  What Year? 0 points 0 points  What month? 0 points 0 points  What time? 0 points 0 points  Count back from 20 0 points 0 points  Months in reverse 0 points 0 points  Repeat phrase 0 points 0  points  Total Score 0 points 0 points    Immunizations Immunization History  Administered Date(s) Administered   Fluad Quad(high Dose 65+) 01/13/2020   Influenza Split 01/01/2011, 11/21/2011, 12/23/2012   Influenza Whole 12/28/2009   Influenza, High Dose Seasonal PF 12/07/2017, 12/06/2018   Influenza-Unspecified 12/23/2013, 11/02/2014, 12/14/2015, 12/09/2020   PFIZER(Purple Top)SARS-COV-2 Vaccination 04/02/2019, 04/23/2019, 01/06/2020   PNEUMOCOCCAL CONJUGATE-20 10/03/2021   PPD Test 02/03/2012   Pneumococcal Conjugate-13 03/07/2014   Pneumococcal Polysaccharide-23 11/21/2011   Td 04/11/2005, 05/09/2015   Zoster Recombinat (Shingrix) 11/02/2018, 01/11/2019   Zoster, Live 12/10/2010    TDAP status: Up to date  Flu Vaccine status: Up to date  Pneumococcal vaccine status: Up to date  Covid-19 vaccine status: Information provided on how to obtain vaccines.   Qualifies for Shingles Vaccine? Yes   Zostavax completed Yes   Shingrix Completed?: Yes  Screening Tests Health Maintenance  Topic Date Due   COVID-19 Vaccine (4 - 2023-24 season) 11/09/2021   COLONOSCOPY (Pts 45-64yrs Insurance coverage will need to be confirmed)  04/10/2022   Medicare Annual Wellness (AWV)  07/03/2022   MAMMOGRAM  10/02/2022   INFLUENZA VACCINE  10/10/2022   DTaP/Tdap/Td (3 - Tdap) 05/08/2025   Pneumonia Vaccine 33+ Years old  Completed   DEXA SCAN  Completed   Hepatitis C Screening  Completed   Zoster Vaccines- Shingrix  Completed   HPV VACCINES  Aged Out    Health Maintenance  Health Maintenance Due  Topic Date Due   COVID-19 Vaccine (4 - 2023-24 season) 11/09/2021   COLONOSCOPY (Pts 45-18yrs Insurance coverage will need to be confirmed)  04/10/2022   Medicare Annual Wellness (AWV)  07/03/2022    Colorectal cancer screening: Type of screening: Colonoscopy. Completed 04/10/17. Repeat every 5 years Ordered on 06/11/22 by [facility]  Mammogram status: Completed 10/01/21. Repeat  every year  Bone Density status: Completed 10/01/21. Results reflect: Bone density results: OSTEOPOROSIS. Repeat every 2 years.  Lung Cancer Screening: (Low Dose CT Chest recommended if Age 14-80 years, 30 pack-year currently smoking OR have quit w/in 15years.) does not qualify.   Additional Screening:  Hepatitis C Screening: does qualify; Completed 05/10/16  Vision Screening: Recommended annual ophthalmology exams for early detection of glaucoma and other disorders of the eye. Is the patient up to date with their annual eye exam?  Yes  Who is the provider or what is the name of the office in which the patient attends annual eye exams? Dr. Tobias Alexander If pt is not established with a provider, would they like to be referred to a provider to establish care? No .   Dental Screening: Recommended annual dental exams for proper oral hygiene  Community Resource Referral / Chronic Care Management: CRR required this visit?  No   CCM required this visit?  No      Plan:     I have personally reviewed and noted the following in the patient's chart:   Medical and social history Use of alcohol, tobacco or illicit drugs  Current medications and supplements including  opioid prescriptions. Patient is not currently taking opioid prescriptions. Functional ability and status Nutritional status Physical activity Advanced directives List of other physicians Hospitalizations, surgeries, and ER visits in previous 12 months Vitals Screenings to include cognitive, depression, and falls Referrals and appointments  In addition, I have reviewed and discussed with patient certain preventive protocols, quality metrics, and best practice recommendations. A written personalized care plan for preventive services as well as general preventive health recommendations were provided to patient.   Due to this being a telephonic visit, the after visit summary with patients personalized plan was offered to patient  via mail or my-chart.  Patient would like to access on my-chart.  Donne Anon, [place]   07/04/2022   Nurse Notes: None

## 2022-07-04 NOTE — Patient Instructions (Signed)
Alice Howard , Thank you for taking time to come for your Medicare Wellness Visit. I appreciate your ongoing commitment to your health goals. Please review the following plan we discussed and let me know if I can assist you in the future.     This is a list of the screening recommended for you and due dates:  Health Maintenance  Topic Date Due   COVID-19 Vaccine (4 - 2023-24 season) 11/09/2021   Colon Cancer Screening  04/10/2022   Mammogram  10/02/2022   Flu Shot  10/10/2022   Medicare Annual Wellness Visit  07/04/2023   DTaP/Tdap/Td vaccine (3 - Tdap) 05/08/2025   Pneumonia Vaccine  Completed   DEXA scan (bone density measurement)  Completed   Hepatitis C Screening: USPSTF Recommendation to screen - Ages 42-79 yo.  Completed   Zoster (Shingles) Vaccine  Completed   HPV Vaccine  Aged Out    Next appointment: Follow up in one year for your annual wellness visit.   Preventive Care 12 Years and Older, Female Preventive care refers to lifestyle choices and visits with your health care provider that can promote health and wellness. What does preventive care include? A yearly physical exam. This is also called an annual well check. Dental exams once or twice a year. Routine eye exams. Ask your health care provider how often you should have your eyes checked. Personal lifestyle choices, including: Daily care of your teeth and gums. Regular physical activity. Eating a healthy diet. Avoiding tobacco and drug use. Limiting alcohol use. Practicing safe sex. Taking low-dose aspirin every day. Taking vitamin and mineral supplements as recommended by your health care provider. What happens during an annual well check? The services and screenings done by your health care provider during your annual well check will depend on your age, overall health, lifestyle risk factors, and family history of disease. Counseling  Your health care provider may ask you questions about your: Alcohol  use. Tobacco use. Drug use. Emotional well-being. Home and relationship well-being. Sexual activity. Eating habits. History of falls. Memory and ability to understand (cognition). Work and work Astronomer. Reproductive health. Screening  You may have the following tests or measurements: Height, weight, and BMI. Blood pressure. Lipid and cholesterol levels. These may be checked every 5 years, or more frequently if you are over 75 years old. Skin check. Lung cancer screening. You may have this screening every year starting at age 43 if you have a 30-pack-year history of smoking and currently smoke or have quit within the past 15 years. Fecal occult blood test (FOBT) of the stool. You may have this test every year starting at age 47. Flexible sigmoidoscopy or colonoscopy. You may have a sigmoidoscopy every 5 years or a colonoscopy every 10 years starting at age 30. Hepatitis C blood test. Hepatitis B blood test. Sexually transmitted disease (STD) testing. Diabetes screening. This is done by checking your blood sugar (glucose) after you have not eaten for a while (fasting). You may have this done every 1-3 years. Bone density scan. This is done to screen for osteoporosis. You may have this done starting at age 93. Mammogram. This may be done every 1-2 years. Talk to your health care provider about how often you should have regular mammograms. Talk with your health care provider about your test results, treatment options, and if necessary, the need for more tests. Vaccines  Your health care provider may recommend certain vaccines, such as: Influenza vaccine. This is recommended every year. Tetanus, diphtheria,  and acellular pertussis (Tdap, Td) vaccine. You may need a Td booster every 10 years. Zoster vaccine. You may need this after age 53. Pneumococcal 13-valent conjugate (PCV13) vaccine. One dose is recommended after age 62. Pneumococcal polysaccharide (PPSV23) vaccine. One dose is  recommended after age 83. Talk to your health care provider about which screenings and vaccines you need and how often you need them. This information is not intended to replace advice given to you by your health care provider. Make sure you discuss any questions you have with your health care provider. Document Released: 03/24/2015 Document Revised: 11/15/2015 Document Reviewed: 12/27/2014 Elsevier Interactive Patient Education  2017 [place] Prevention in the Home Falls can cause injuries. They can happen to people of all ages. There are many things you can do to make your home safe and to help prevent falls. What can I do on the outside of my home? Regularly fix the edges of walkways and driveways and fix any cracks. Remove anything that might make you trip as you walk through a door, such as a raised step or threshold. Trim any bushes or trees on the path to your home. Use bright outdoor lighting. Clear any walking paths of anything that might make someone trip, such as rocks or tools. Regularly check to see if handrails are loose or broken. Make sure that both sides of any steps have handrails. Any raised decks and porches should have guardrails on the edges. Have any leaves, snow, or ice cleared regularly. Use sand or salt on walking paths during winter. Clean up any spills in your garage right away. This includes oil or grease spills. What can I do in the bathroom? Use night lights. Install grab bars by the toilet and in the tub and shower. Do not use towel bars as grab bars. Use non-skid mats or decals in the tub or shower. If you need to sit down in the shower, use a plastic, non-slip stool. Keep the floor dry. Clean up any water that spills on the floor as soon as it happens. Remove soap buildup in the tub or shower regularly. Attach bath mats securely with double-sided non-slip rug tape. Do not have throw rugs and other things on the floor that can make you  trip. What can I do in the bedroom? Use night lights. Make sure that you have a light by your bed that is easy to reach. Do not use any sheets or blankets that are too big for your bed. They should not hang down onto the floor. Have a firm chair that has side arms. You can use this for support while you get dressed. Do not have throw rugs and other things on the floor that can make you trip. What can I do in the kitchen? Clean up any spills right away. Avoid walking on wet floors. Keep items that you use a lot in easy-to-reach places. If you need to reach something above you, use a strong step stool that has a grab bar. Keep electrical cords out of the way. Do not use floor polish or wax that makes floors slippery. If you must use wax, use non-skid floor wax. Do not have throw rugs and other things on the floor that can make you trip. What can I do with my stairs? Do not leave any items on the stairs. Make sure that there are handrails on both sides of the stairs and use them. Fix handrails that are broken or loose. Make sure  that handrails are as long as the stairways. Check any carpeting to make sure that it is firmly attached to the stairs. Fix any carpet that is loose or worn. Avoid having throw rugs at the top or bottom of the stairs. If you do have throw rugs, attach them to the floor with carpet tape. Make sure that you have a light switch at the top of the stairs and the bottom of the stairs. If you do not have them, ask someone to add them for you. What else can I do to help prevent falls? Wear shoes that: Do not have high heels. Have rubber bottoms. Are comfortable and fit you well. Are closed at the toe. Do not wear sandals. If you use a stepladder: Make sure that it is fully opened. Do not climb a closed stepladder. Make sure that both sides of the stepladder are locked into place. Ask someone to hold it for you, if possible. Clearly mark and make sure that you can  see: Any grab bars or handrails. First and last steps. Where the edge of each step is. Use tools that help you move around (mobility aids) if they are needed. These include: Canes. Walkers. Scooters. Crutches. Turn on the lights when you go into a dark area. Replace any light bulbs as soon as they burn out. Set up your furniture so you have a clear path. Avoid moving your furniture around. If any of your floors are uneven, fix them. If there are any pets around you, be aware of where they are. Review your medicines with your doctor. Some medicines can make you feel dizzy. This can increase your chance of falling. Ask your doctor what other things that you can do to help prevent falls. This information is not intended to replace advice given to you by your health care provider. Make sure you discuss any questions you have with your health care provider. Document Released: 12/22/2008 Document Revised: 08/03/2015 Document Reviewed: 04/01/2014 Elsevier Interactive Patient Education  2017 [employer].

## 2022-07-15 DIAGNOSIS — Z1211 Encounter for screening for malignant neoplasm of colon: Secondary | ICD-10-CM | POA: Diagnosis not present

## 2022-07-15 DIAGNOSIS — K5730 Diverticulosis of large intestine without perforation or abscess without bleeding: Secondary | ICD-10-CM | POA: Diagnosis not present

## 2022-07-15 DIAGNOSIS — K635 Polyp of colon: Secondary | ICD-10-CM | POA: Diagnosis not present

## 2022-07-15 DIAGNOSIS — Z800 Family history of malignant neoplasm of digestive organs: Secondary | ICD-10-CM | POA: Diagnosis not present

## 2022-07-15 LAB — HM COLONOSCOPY

## 2022-07-17 DIAGNOSIS — K635 Polyp of colon: Secondary | ICD-10-CM | POA: Diagnosis not present

## 2022-07-29 DIAGNOSIS — Z872 Personal history of diseases of the skin and subcutaneous tissue: Secondary | ICD-10-CM | POA: Diagnosis not present

## 2022-07-29 DIAGNOSIS — L720 Epidermal cyst: Secondary | ICD-10-CM | POA: Diagnosis not present

## 2022-10-04 ENCOUNTER — Encounter: Payer: Self-pay | Admitting: Internal Medicine

## 2022-10-07 ENCOUNTER — Encounter: Payer: Self-pay | Admitting: Internal Medicine

## 2022-10-07 ENCOUNTER — Other Ambulatory Visit (HOSPITAL_BASED_OUTPATIENT_CLINIC_OR_DEPARTMENT_OTHER): Payer: Self-pay | Admitting: Internal Medicine

## 2022-10-07 ENCOUNTER — Ambulatory Visit (INDEPENDENT_AMBULATORY_CARE_PROVIDER_SITE_OTHER): Payer: Medicare Other | Admitting: Internal Medicine

## 2022-10-07 VITALS — BP 122/78 | HR 73 | Temp 97.7°F | Resp 16 | Ht 64.0 in | Wt 155.0 lb

## 2022-10-07 DIAGNOSIS — Z1231 Encounter for screening mammogram for malignant neoplasm of breast: Secondary | ICD-10-CM

## 2022-10-07 DIAGNOSIS — R739 Hyperglycemia, unspecified: Secondary | ICD-10-CM | POA: Diagnosis not present

## 2022-10-07 DIAGNOSIS — E785 Hyperlipidemia, unspecified: Secondary | ICD-10-CM

## 2022-10-07 DIAGNOSIS — Z0000 Encounter for general adult medical examination without abnormal findings: Secondary | ICD-10-CM | POA: Diagnosis not present

## 2022-10-07 LAB — CBC WITH DIFFERENTIAL/PLATELET
Basophils Absolute: 0.1 10*3/uL (ref 0.0–0.1)
Basophils Relative: 0.8 % (ref 0.0–3.0)
Eosinophils Absolute: 0.2 10*3/uL (ref 0.0–0.7)
Eosinophils Relative: 3.4 % (ref 0.0–5.0)
HCT: 41.3 % (ref 36.0–46.0)
Hemoglobin: 13.4 g/dL (ref 12.0–15.0)
Lymphocytes Relative: 25.7 % (ref 12.0–46.0)
Lymphs Abs: 1.6 10*3/uL (ref 0.7–4.0)
MCHC: 32.4 g/dL (ref 30.0–36.0)
MCV: 95.6 fl (ref 78.0–100.0)
Monocytes Absolute: 0.5 10*3/uL (ref 0.1–1.0)
Monocytes Relative: 7.9 % (ref 3.0–12.0)
Neutro Abs: 4 10*3/uL (ref 1.4–7.7)
Neutrophils Relative %: 62.2 % (ref 43.0–77.0)
Platelets: 189 10*3/uL (ref 150.0–400.0)
RBC: 4.32 Mil/uL (ref 3.87–5.11)
RDW: 12.3 % (ref 11.5–15.5)
WBC: 6.4 10*3/uL (ref 4.0–10.5)

## 2022-10-07 LAB — LIPID PANEL
Cholesterol: 207 mg/dL — ABNORMAL HIGH (ref 0–200)
HDL: 62 mg/dL (ref >39.00)
LDL Cholesterol: 117 mg/dL — ABNORMAL HIGH (ref 0–99)
NonHDL: 144.58
Total CHOL/HDL Ratio: 3
Triglycerides: 137 mg/dL (ref 0.0–149.0)
VLDL: 27.4 mg/dL (ref 0.0–40.0)

## 2022-10-07 LAB — COMPREHENSIVE METABOLIC PANEL
ALT: 15 U/L (ref 0–35)
AST: 18 U/L (ref 0–37)
Albumin: 4.5 g/dL (ref 3.5–5.2)
Alkaline Phosphatase: 60 U/L (ref 39–117)
BUN: 17 mg/dL (ref 6–23)
CO2: 28 mEq/L (ref 19–32)
Calcium: 9.3 mg/dL (ref 8.4–10.5)
Chloride: 102 mEq/L (ref 96–112)
Creatinine, Ser: 0.72 mg/dL (ref 0.40–1.20)
GFR: 81.83 mL/min (ref >60.00)
Glucose, Bld: 106 mg/dL — ABNORMAL HIGH (ref 70–99)
Potassium: 3.8 mEq/L (ref 3.5–5.1)
Sodium: 139 mEq/L (ref 135–145)
Total Bilirubin: 1.3 mg/dL — ABNORMAL HIGH (ref 0.2–1.2)
Total Protein: 6.6 g/dL (ref 6.0–8.3)

## 2022-10-07 LAB — HEMOGLOBIN A1C: Hgb A1c MFr Bld: 5.8 % (ref 4.6–6.5)

## 2022-10-07 NOTE — Progress Notes (Unsigned)
Subjective:    Patient ID: Alice Howard, female    DOB: [birth year], 77 y.o.   MRN: [id number]  DOS:  10/07/2022 Type of visit - description: CPX  Here for CPX. Chronic medical problems addressed. Continue with some sinus congestion but no headaches.   Review of Systems See above   Past Medical History:  Diagnosis Date   Allergic rhinitis    Baker's cyst    at the L per MRI ordered by ortho aprox 2008 patient thinks is B   History of colonic polyps    multiple Cscopes,   Hyperlipidemia    OA (osteoarthritis)    sees Ortho, 2011 they talked about TKR L   Osteoporosis    Vertigo     Past Surgical History:  Procedure Laterality Date   HEMORRHOID SURGERY  1984   JOINT REPLACEMENT     KNEE SURGERY  1994   due to skiing accident.  Has 2 screws in knee. Gets Euflexxa injections.   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   Cholecalciferol (VITAMIN D-3 PO) Oral, Daily   fexofenadine (ALLEGRA ALLERGY) 60 mg, 2 times daily   fluticasone (FLONASE) 50 MCG/ACT nasal spray SHAKE LIQUID AND USE 2 SPRAYS IN EACH NOSTRIL DAILY   Multiple Vitamins-Minerals (MULTIVITAMIN PO) 1 tablet, Oral, Daily   simvastatin (ZOCOR) 40 MG tablet TAKE 1 TABLET(40 MG) BY MOUTH AT BEDTIME       Objective:   Physical Exam BP 122/78   Pulse 73   Temp 97.7 F (36.5 C) (Oral)   Resp 16   Ht 5\' 4"  (1.626 m)   Wt 155 lb (70.3 kg)   SpO2 97%   BMI 26.61 kg/m  General: Well developed, NAD, BMI noted Neck: No  thyromegaly  HEENT:  Normocephalic . Face symmetric, atraumatic.  Nose slightly congested Lungs:  CTA B Normal respiratory effort, no intercostal  retractions, no accessory muscle use. Heart: RRR,  no murmur.  Abdomen:  Not distended, soft, non-tender. No rebound or rigidity.   Lower extremities: no pretibial edema bilaterally  Skin: Exposed areas without rash. Not pale. Not jaundice Neurologic:  alert & oriented X3.  Speech normal, gait appropriate for age and unassisted Strength symmetric and appropriate for age.  Psych: Cognition and judgment appear intact.  Cooperative with normal attention span and concentration.  Behavior appropriate. No anxious or depressed appearing.     Assessment    Assessment Hyperlipidemia Osteoporosis T score 2009 -2.6, , 2011 -2.4 , 2013 -2.3,  2017 -2.2 Boniva started a holiday 2015 after 5 years. T score 05-2015 -2.2-continue vitamin D T score -2.9 07/20/2018 T score 09-2021:-2.7  MSK-- DJD, R ankle pain  Baker's cyst cyst, per MRI 2008 R groin -distal RLQ pain on-off,   2013 abdomen and pelvic CT   (-). Saw ortho:  not related to DJD of the hip. Carotid US 3-17:  < 50%, [place] 3/19: see report, no further routine [place]   PLAN: Here for CPX  -Td 04-2015 -Pneumonia 2013; prevnar 03-07-14; PNM 20: 2023 - zostavax 2012;  s/p shingrex  -Vaccines I recommend: RSV, flu shot (fall) COVID booster if not  done after 11-2021 - [facility]: 02/14/12 w/ Dr. Madilyn Fireman; 5mm polyp removed from rectum; cscope  04/10/2017,  next 5 years.  Had colonoscopy May 2024.  Reports requested.  MMG 09-2021 ([facility]) --- Lifestyle discussed ---Labs:CMP FLP CBC A1c - POA on file Hyperlipidemia: On simvastatin, checking labs. Osteoporosis definitely intolerant to Fosamax due to pain, she is stop the medication.  Normal vitamin D on chart review.  Recommend to discuss with Endo and remain active. Chronic sinusitis: Since LOV, saw ENT, they had no new suggestions.  Encouraged nasal sprays. Hyperglycemia: She attends a aging study, recently was flew to [facility] I had an extensive workup, A1c was 6.1.  Recheck.  Encouraged healthy  diet. RTC 6 months   Sinusitis: Subacute sinusitis going on for about 6 weeks, she did have a CT of the head done in November for dizziness (which is now resolved) and it showed mucosal thickening in a partially covered sinuses. Plan: Augmentin x 10 days for presumed sinusitis, continue antihistaminics, Flonase not helping, switch to Astepro. Round of prednisone If not better in the next 2 to 3 weeks, she will let me know.  ENT referral? Osteoporosis: Seen by Endo 02/22/2022, they noted she try Boniva in the past Prolia cause side effects. Was recommended alendronate, calcium and vitamin D.

## 2022-10-07 NOTE — Patient Instructions (Addendum)
Vaccines I recommend: Covid booster RSV vaccine Flu shot this fall   For nasal congestion: Consistent use of OTC Flonase 2 sprays daily OTC Astepro: 2 sprays twice daily     GO TO THE LAB : Get the blood work     GO TO THE FRONT DESK, PLEASE SCHEDULE YOUR APPOINTMENTS Come back for   checkup in 6 months

## 2022-10-08 ENCOUNTER — Encounter: Payer: Self-pay | Admitting: Internal Medicine

## 2022-10-08 ENCOUNTER — Encounter (HOSPITAL_BASED_OUTPATIENT_CLINIC_OR_DEPARTMENT_OTHER): Payer: Self-pay

## 2022-10-08 ENCOUNTER — Ambulatory Visit (HOSPITAL_BASED_OUTPATIENT_CLINIC_OR_DEPARTMENT_OTHER)
Admission: RE | Admit: 2022-10-08 | Discharge: 2022-10-08 | Disposition: A | Discharge status: Home / Self Care | Payer: Medicare Other | Source: Ambulatory Visit | Attending: Internal Medicine | Admitting: Internal Medicine

## 2022-10-08 DIAGNOSIS — Z1231 Encounter for screening mammogram for malignant neoplasm of breast: Secondary | ICD-10-CM | POA: Diagnosis not present

## 2022-10-08 NOTE — Assessment & Plan Note (Signed)
Here for CPX -Td 04-2015 -Pneumonia 2013; prevnar 03-07-14; PNM 20: 2023 - zostavax 2012;  s/p shingrex  -Vaccines I recommend: RSV, flu shot (fall) COVID booster if not done after 11-2021 - [facility]: 02/14/12 w/ Dr. Madilyn Fireman; 5mm polyp removed from rectum; cscope  04/10/2017,  next 5 years.  Had colonoscopy May 2024.  Reports requested.   --MMG 09-2021 ([facility]) --- Lifestyle discussed ---Labs:CMP FLP CBC A1c - POA on file

## 2022-10-08 NOTE — Assessment & Plan Note (Signed)
Here for CPX  Hyperlipidemia: On simvastatin, checking labs. Osteoporosis  self d/c  Fosamax due to pain, h/o wnl Vit D levels, rec to d/w  Endo next steps. Chronic sinusitis: Since LOV, saw ENT, they had no new suggestions.  Encouraged nasal sprays. Hyperglycemia: She attends a aging study, recently was flew to [facility] and had an extensive workup, A1c was 6.1.   RTC 6 months

## 2022-10-25 ENCOUNTER — Encounter: Payer: Self-pay | Admitting: Internal Medicine

## 2022-10-30 ENCOUNTER — Other Ambulatory Visit: Payer: Self-pay | Admitting: Internal Medicine

## 2022-12-04 DIAGNOSIS — H25043 Posterior subcapsular polar age-related cataract, bilateral: Secondary | ICD-10-CM | POA: Diagnosis not present

## 2022-12-04 DIAGNOSIS — H2513 Age-related nuclear cataract, bilateral: Secondary | ICD-10-CM | POA: Diagnosis not present

## 2022-12-04 DIAGNOSIS — H02831 Dermatochalasis of right upper eyelid: Secondary | ICD-10-CM | POA: Diagnosis not present

## 2022-12-04 DIAGNOSIS — H18413 Arcus senilis, bilateral: Secondary | ICD-10-CM | POA: Diagnosis not present

## 2022-12-20 ENCOUNTER — Encounter: Payer: Self-pay | Admitting: Internal Medicine

## 2022-12-23 NOTE — Telephone Encounter (Signed)
Ok for Tb test

## 2022-12-25 ENCOUNTER — Ambulatory Visit (INDEPENDENT_AMBULATORY_CARE_PROVIDER_SITE_OTHER): Payer: Medicare Other

## 2022-12-25 DIAGNOSIS — Z111 Encounter for screening for respiratory tuberculosis: Secondary | ICD-10-CM

## 2022-12-25 NOTE — Progress Notes (Signed)
Pt here for TB skin test per Dr. Drue Novel. TB placed on lower left arm. Pt scheduled for Friday for reading.

## 2022-12-27 ENCOUNTER — Ambulatory Visit: Payer: Medicare Other | Admitting: *Deleted

## 2022-12-27 DIAGNOSIS — Z111 Encounter for screening for respiratory tuberculosis: Secondary | ICD-10-CM | POA: Diagnosis not present

## 2022-12-27 LAB — TB SKIN TEST
Induration: 0 mm
TB Skin Test: NEGATIVE

## 2022-12-27 NOTE — Progress Notes (Addendum)
PPD reading was negative.  Results letter printed for pt.

## 2023-01-13 DIAGNOSIS — K080 Exfoliation of teeth due to systemic causes: Secondary | ICD-10-CM | POA: Diagnosis not present

## 2023-01-22 DIAGNOSIS — M19071 Primary osteoarthritis, right ankle and foot: Secondary | ICD-10-CM | POA: Diagnosis not present

## 2023-04-09 ENCOUNTER — Encounter: Payer: Self-pay | Admitting: Internal Medicine

## 2023-04-09 ENCOUNTER — Ambulatory Visit (INDEPENDENT_AMBULATORY_CARE_PROVIDER_SITE_OTHER): Payer: 59 | Admitting: Internal Medicine

## 2023-04-09 VITALS — BP 106/74 | HR 72 | Temp 98.0°F | Resp 16 | Ht 64.0 in | Wt 156.5 lb

## 2023-04-09 DIAGNOSIS — D239 Other benign neoplasm of skin, unspecified: Secondary | ICD-10-CM

## 2023-04-09 DIAGNOSIS — E785 Hyperlipidemia, unspecified: Secondary | ICD-10-CM

## 2023-04-09 DIAGNOSIS — M810 Age-related osteoporosis without current pathological fracture: Secondary | ICD-10-CM | POA: Diagnosis not present

## 2023-04-09 NOTE — Progress Notes (Signed)
Subjective:    Patient ID: Alice Howard, female    DOB: January 11, 1946, 78 y.o.   MRN: [id number]  DOS:  04/09/2023 Type of visit - description: f/u Since the last office visit is feeling well. Found lump at the left leg.  No pain, no discharge.  Would like me to look at it. Stopped Fosamax.   Review of Systems See above   Past Medical History:  Diagnosis Date   Allergic rhinitis    Baker's cyst    at the L per MRI ordered by ortho aprox 2008 patient thinks is B   History of colonic polyps    multiple Cscopes,   Hyperlipidemia    OA (osteoarthritis)    sees Ortho, 2011 they talked about TKR L   Osteoporosis    Vertigo     Past Surgical History:  Procedure Laterality Date   HEMORRHOID SURGERY  1984   JOINT REPLACEMENT     KNEE SURGERY  1994   due to skiing accident.  Has 2 screws in knee. Gets Euflexxa injections.   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   Procedure: LEFT TOTAL KNEE ARTHROPLASTY WITH HARDWARE REMOVAL;  Surgeon: Loanne Drilling, MD;  Location: [facility];  Service: Orthopedics;  Laterality: Left;   TUBAL LIGATION      Current Outpatient Medications  Medication Instructions   Cholecalciferol (VITAMIN D-3 PO) Daily   fluticasone (FLONASE) 50 MCG/ACT nasal spray SHAKE LIQUID AND USE 2 SPRAYS IN EACH NOSTRIL DAILY   Multiple Vitamins-Minerals (MULTIVITAMIN PO) 1 tablet, Daily   simvastatin (ZOCOR) 40 mg, Oral, Daily at bedtime   TURMERIC PO Take by mouth.       Objective:   Physical Exam Skin:        BP 106/74   Pulse 72   Temp 98 F (36.7 C) (Oral)   Resp 16   Ht 5\' 4"  (1.626 m)   Wt 156 lb 8 oz (71 kg)   SpO2 97%   BMI 26.86 kg/m  General:   Well developed, NAD, BMI noted. HEENT:  Normocephalic . Face symmetric, atraumatic Lungs:  CTA B Normal respiratory effort, no intercostal retractions, no accessory muscle use. Heart: RRR,  no murmur.  Lower extremities: no pretibial edema bilaterally  Skin: Not pale. Not jaundice Neurologic:   alert & oriented X3.  Speech normal, gait appropriate for age and unassisted Psych--  Cognition and judgment appear intact.  Cooperative with normal attention span and concentration.  Behavior appropriate. No anxious or depressed appearing.      Assessment     Assessment Hyperlipidemia Osteoporosis T score 2009 -2.6, , 2011 -2.4 , 2013 -2.3,  2017 -2.2 Boniva started a holiday 2015 after 5 years. T score 05-2015 -2.2-continue vitamin D T score -2.9 07/20/2018 T score 09-2021:-2.7  03/2923: see OV has decided NOT to pursue treatment MSK-- DJD, R ankle pain  Baker's cyst cyst, per MRI 2008 R groin -distal RLQ pain on-off,   2013 abdomen and pelvic CT   (-). Saw ortho:  not related to DJD of the hip. Carotid US 3-17:  < 50%, [place] 3/19: see report, no further routine [place]   PLAN: Hyperlipidemia: On moderate statins.  No change. Osteoporosis: Not taking Fosamax, offered re-referral to endocrinology  but she has made her mind that will not take medication. Dermatofibroma: Small lump at the left leg likely a dermatofibroma, patient to call if changing size or color. Vaccine advised, recommend RSV, she seems receptive.  Recommend a COVID-vaccine, she  is quite hesitant, benefits discussed. RTC 6 months

## 2023-04-09 NOTE — Patient Instructions (Addendum)
Vaccines I recommend: RSV  COVID booster   Next visit with me in 6 months for a physical exam Please schedule it at the front desk

## 2023-04-10 DIAGNOSIS — H2513 Age-related nuclear cataract, bilateral: Secondary | ICD-10-CM | POA: Diagnosis not present

## 2023-04-11 ENCOUNTER — Encounter: Payer: Self-pay | Admitting: Internal Medicine

## 2023-04-11 NOTE — Assessment & Plan Note (Signed)
Hyperlipidemia: On moderate statins.  No change. Osteoporosis: Not taking Fosamax, offered re-referral to endocrinology  but she has made her mind that will not take medication. Dermatofibroma: Small lump at the left leg likely a dermatofibroma, patient to call if changing size or color. Vaccine advised, recommend RSV, she seems receptive.  Recommend a COVID-vaccine, she is quite hesitant, benefits discussed. RTC 6 months

## 2023-05-11 ENCOUNTER — Other Ambulatory Visit: Payer: Self-pay | Admitting: Internal Medicine

## 2023-05-19 ENCOUNTER — Encounter: Payer: Self-pay | Admitting: Internal Medicine

## 2023-05-19 ENCOUNTER — Emergency Department (HOSPITAL_BASED_OUTPATIENT_CLINIC_OR_DEPARTMENT_OTHER)
Admission: EM | Admit: 2023-05-19 | Discharge: 2023-05-19 | Disposition: A | Discharge status: Against Medical Advice | Attending: Emergency Medicine | Admitting: Emergency Medicine

## 2023-05-19 ENCOUNTER — Encounter (HOSPITAL_BASED_OUTPATIENT_CLINIC_OR_DEPARTMENT_OTHER): Payer: Self-pay | Admitting: Emergency Medicine

## 2023-05-19 ENCOUNTER — Emergency Department (HOSPITAL_BASED_OUTPATIENT_CLINIC_OR_DEPARTMENT_OTHER)

## 2023-05-19 ENCOUNTER — Other Ambulatory Visit: Payer: Self-pay

## 2023-05-19 ENCOUNTER — Ambulatory Visit: Payer: Self-pay | Admitting: Internal Medicine

## 2023-05-19 DIAGNOSIS — R55 Syncope and collapse: Secondary | ICD-10-CM | POA: Diagnosis not present

## 2023-05-19 DIAGNOSIS — I6782 Cerebral ischemia: Secondary | ICD-10-CM | POA: Diagnosis not present

## 2023-05-19 DIAGNOSIS — H538 Other visual disturbances: Secondary | ICD-10-CM | POA: Insufficient documentation

## 2023-05-19 DIAGNOSIS — R0789 Other chest pain: Secondary | ICD-10-CM | POA: Diagnosis not present

## 2023-05-19 DIAGNOSIS — Z5321 Procedure and treatment not carried out due to patient leaving prior to being seen by health care provider: Secondary | ICD-10-CM | POA: Insufficient documentation

## 2023-05-19 DIAGNOSIS — R42 Dizziness and giddiness: Secondary | ICD-10-CM | POA: Insufficient documentation

## 2023-05-19 DIAGNOSIS — R079 Chest pain, unspecified: Secondary | ICD-10-CM | POA: Diagnosis not present

## 2023-05-19 LAB — BASIC METABOLIC PANEL
Anion gap: 6 (ref 5–15)
BUN: 13 mg/dL (ref 8–23)
CO2: 29 mmol/L (ref 22–32)
Calcium: 9.2 mg/dL (ref 8.9–10.3)
Chloride: 102 mmol/L (ref 98–111)
Creatinine, Ser: 0.83 mg/dL (ref 0.44–1.00)
GFR, Estimated: >60 mL/min (ref >60)
Glucose, Bld: 152 mg/dL — ABNORMAL HIGH (ref 70–99)
Potassium: 4.2 mmol/L (ref 3.5–5.1)
Sodium: 137 mmol/L (ref 135–145)

## 2023-05-19 LAB — CBC
HCT: 39.9 % (ref 36.0–46.0)
Hemoglobin: 13.5 g/dL (ref 12.0–15.0)
MCH: 31.7 pg (ref 26.0–34.0)
MCHC: 33.8 g/dL (ref 30.0–36.0)
MCV: 93.7 fL (ref 80.0–100.0)
Platelets: 176 10*3/uL (ref 150–400)
RBC: 4.26 MIL/uL (ref 3.87–5.11)
RDW: 11.3 % — ABNORMAL LOW (ref 11.5–15.5)
WBC: 7.8 10*3/uL (ref 4.0–10.5)
nRBC: 0 % (ref 0.0–0.2)

## 2023-05-19 LAB — TROPONIN I (HIGH SENSITIVITY): Troponin I (High Sensitivity): 3 ng/L (ref <18)

## 2023-05-19 NOTE — Telephone Encounter (Signed)
FYI. Pt going to ED.  

## 2023-05-19 NOTE — ED Triage Notes (Addendum)
 Left sided chest pain with radiation down left arm, dizziness, and blurry vision. Sx have been present intermittent for "severe weeks" PCP sent patient here for eval. H/o high cholesterol, on simvastatin. Took baby aspirin pta with some relief.

## 2023-05-19 NOTE — Telephone Encounter (Signed)
  Chief Complaint: Intermittent arm and neck pain, & dizziness Symptoms: above Frequency: a couple of weeks Pertinent Negatives: Patient denies chest pain sob Disposition: [x] ED /[] Urgent Care (no appt availability in office) / [] Appointment(In office/virtual)/ []  [facility]/ [] Home Care/ [] Refused Recommended Disposition /[] [facility]/ []  Follow-up with PCP Additional Notes: Pt reports intermittent left arm pain and neck pain, along with dizziness. Pt states she feels off.  Pt will go to Ed for evaluation.    Copied from CRM [id number]. Topic: Clinical - Red Word Triage >> May 19, 2023  2:11 PM Adele Barthel wrote: [facility] that prompted transfer to Nurse Triage:   Symptoms for a couple weeks Recently returned from traveling in [place] like something is off Sinus symptoms Left arm feels achy Back of neck on left side feels achy Feels lightheaded occasionally when moving too fast  Blood pressure and heart rate feel normal Vision is fuzzy occasionally.  Taking baby aspirin when having symptoms.  No other over-the-counter medication used for symptoms. Reason for Disposition  Patient sounds very sick or weak to the triager  Answer Assessment - Initial Assessment Questions 1. ONSET: "When did the pain start?"     A couple of weeks 2. LOCATION: "Where is the pain located?"     Left arm 3. PAIN: "How bad is the pain?" (Scale 1-10; or mild, moderate, severe)   - MILD (1-3): Doesn't interfere with normal activities.   - MODERATE (4-7): Interferes with normal activities (e.g., work or school) or awakens from sleep.   - SEVERE (8-10): Excruciating pain, unable to do any normal activities, unable to hold a cup of water.     Mild -  1 hour 4. WORK OR EXERCISE: "Has there been any recent work or exercise that involved this part of the body?"     no 5. CAUSE: "What do you think is causing the arm pain?"     unsure 6. OTHER SYMPTOMS: "Do you have any other  symptoms?" (e.g., neck pain, swelling, rash, fever, numbness, weakness)     lightheaded  Protocols used: Arm Pain-A-AH

## 2023-05-20 ENCOUNTER — Encounter: Payer: Self-pay | Admitting: Internal Medicine

## 2023-05-20 NOTE — Telephone Encounter (Signed)
 Pt left ED w/o being seen. She has an appt w/ PCP scheduled 05/26/23

## 2023-05-26 ENCOUNTER — Encounter: Payer: Self-pay | Admitting: Internal Medicine

## 2023-05-26 ENCOUNTER — Ambulatory Visit: Admitting: Internal Medicine

## 2023-05-26 ENCOUNTER — Ambulatory Visit (INDEPENDENT_AMBULATORY_CARE_PROVIDER_SITE_OTHER): Admitting: Internal Medicine

## 2023-05-26 VITALS — BP 108/68 | HR 63 | Temp 98.3°F | Resp 16 | Ht 64.0 in | Wt 155.1 lb

## 2023-05-26 DIAGNOSIS — R0989 Other specified symptoms and signs involving the circulatory and respiratory systems: Secondary | ICD-10-CM | POA: Diagnosis not present

## 2023-05-26 DIAGNOSIS — R0609 Other forms of dyspnea: Secondary | ICD-10-CM | POA: Diagnosis not present

## 2023-05-26 DIAGNOSIS — R42 Dizziness and giddiness: Secondary | ICD-10-CM | POA: Diagnosis not present

## 2023-05-26 DIAGNOSIS — R079 Chest pain, unspecified: Secondary | ICD-10-CM | POA: Diagnosis not present

## 2023-05-26 NOTE — Progress Notes (Signed)
 Subjective:    Patient ID: Alice Howard, female    DOB: 16-Sep-1945, 78 y.o.   MRN: [id number]  DOS:  05/26/2023 Type of visit - description: ER follow-up  The patient has developed multiple symptoms in the last 6 to 8 weeks. States  "I don't feel right".  Lightheadedness, on and off, last seconds, "less than 1 minute".  No associated slurred speech, LOC, nausea, or double vision.  Some blurry vision?Marland Kitchen Sx is not describe as dizziness; it happens sometimes at rest and sometimes when she stands up quickly.  Also 1 month history of L upper back, neck, head pain that often times goes to the left arm and the left anterior chest. Denies any injury. Symptoms are random. No motor deficit. No actual paresthesias.  Also DOE when he walks upstairs, no associated with cough, wheezing, chest pain. No edema.  Reports  stress  Went to the ER, workup was done and summarized below (did not wait to see the doctor): BMP and CBC normal, troponin negative, CT head no acute, chest x-ray no acute  Review of Systems See above   Past Medical History:  Diagnosis Date   Allergic rhinitis    Baker's cyst    at the L per MRI ordered by ortho aprox 2008 patient thinks is B   History of colonic polyps    multiple Cscopes,   Hyperlipidemia    OA (osteoarthritis)    sees Ortho, 2011 they talked about TKR L   Osteoporosis    Vertigo     Past Surgical History:  Procedure Laterality Date   HEMORRHOID SURGERY  1984   JOINT REPLACEMENT     KNEE SURGERY  1994   due to skiing accident.  Has 2 screws in knee. Gets Euflexxa injections.   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   Procedure: LEFT TOTAL KNEE ARTHROPLASTY WITH HARDWARE REMOVAL;  Surgeon: Loanne Drilling, MD;  Location: [facility];  Service: Orthopedics;  Laterality: Left;   TUBAL LIGATION      Current Outpatient Medications  Medication Instructions   Cholecalciferol (VITAMIN D-3 PO) Daily   fluticasone (FLONASE) 50 MCG/ACT nasal spray SHAKE  LIQUID AND USE 2 SPRAYS IN EACH NOSTRIL DAILY   Multiple Vitamins-Minerals (MULTIVITAMIN PO) 1 tablet, Daily   simvastatin (ZOCOR) 40 mg, Oral, Daily at bedtime   TURMERIC PO Take by mouth.       Objective:   Physical Exam BP 108/68   Pulse 63   Temp 98.3 F (36.8 C) (Oral)   Resp 16   Ht 5\' 4"  (1.626 m)   Wt 155 lb 2 oz (70.4 kg)   SpO2 99%   BMI 26.63 kg/m  General:   Well developed, NAD, BMI noted. HEENT:  Normocephalic . Face symmetric, atraumatic Neck:  o JVD at 45 degrees. Palpable but decreased carotid pulses.  No bruit. No TTP at the cervical spine. ROM neck normal. Lungs:  CTA B Normal respiratory effort, no intercostal retractions, no accessory muscle use. Heart: RRR,  no murmur.  Lower extremities: no pretibial edema bilaterally  Skin: Not pale. Not jaundice Neurologic:  alert & oriented X3.  Speech normal, gait appropriate for age and unassisted.  Motor and DTR symmetric. Psych--  Cognition and judgment appear intact.  Cooperative with normal attention span and concentration.  Behavior appropriate. Somewhat emotional during the visit     Assessment   Assessment Hyperlipidemia Osteoporosis T score 2009 -2.6, , 2011 -2.4 , 2013 -2.3,  2017 -2.2 Boniva started  a holiday 2015 after 5 years. T score 05-2015 -2.2-continue vitamin D T score -2.9 07/20/2018 T score 09-2021:-2.7  03/2923: see OV has decided NOT to pursue treatment MSK-- DJD, R ankle pain  Baker's cyst cyst, per MRI 2008 R groin -distal RLQ pain on-off,   2013 abdomen and pelvic CT   (-). Saw ortho:  not related to DJD of the hip. Carotid US 3-17:  < 50%, [place] 3/19: see report, no further routine [place]   PLAN: L neck/arm pain, CP, DOE, decreased carotid pulses?. Presents with multiple symptoms, recent blood work at the ER showing normal hemoglobin, chest x-ray WNL and nonacute CT head.    EKG today: No acute, similar to previous EKGs. Plan: Carotid ultrasound, Myoview. Consider orthopedic  referral for neck / chest/arm pain depending on results. Follow-up in 1 month  Reach out sooner if severe symptoms.

## 2023-05-26 NOTE — Patient Instructions (Addendum)
 Will arrange for a carotid ultrasound stress test.  Come back in 1 months.  If you have severe symptoms call anytime or go to the emergency room

## 2023-05-26 NOTE — Assessment & Plan Note (Signed)
 L neck/arm pain, CP, DOE, decreased carotid pulses?. Presents with multiple symptoms, recent blood work at the ER showing normal hemoglobin, chest x-ray WNL and nonacute CT head.    EKG today: No acute, similar to previous EKGs. Plan: Carotid ultrasound, Myoview. Consider orthopedic referral for neck / chest/arm pain depending on results. Follow-up in 1 month  Reach out sooner if severe symptoms.

## 2023-05-30 ENCOUNTER — Encounter (HOSPITAL_COMMUNITY): Payer: Self-pay | Admitting: Internal Medicine

## 2023-05-30 ENCOUNTER — Encounter (HOSPITAL_COMMUNITY): Payer: Self-pay

## 2023-06-04 ENCOUNTER — Ambulatory Visit (HOSPITAL_COMMUNITY): Attending: Internal Medicine

## 2023-06-04 DIAGNOSIS — R079 Chest pain, unspecified: Secondary | ICD-10-CM | POA: Diagnosis not present

## 2023-06-04 DIAGNOSIS — R0609 Other forms of dyspnea: Secondary | ICD-10-CM | POA: Insufficient documentation

## 2023-06-04 LAB — MYOCARDIAL PERFUSION IMAGING
LV dias vol: 60 mL (ref 46–106)
LV sys vol: 20 mL
Nuc Stress EF: 66 %
Peak HR: 108 {beats}/min
Rest HR: 70 {beats}/min
Rest Nuclear Isotope Dose: 10.7 mCi
SDS: 0
SRS: 1
SSS: 1
ST Depression (mm): 0 mm
Stress Nuclear Isotope Dose: 31.9 mCi
TID: 1.19

## 2023-06-04 MED ORDER — REGADENOSON 0.4 MG/5ML IV SOLN
0.4000 mg | Freq: Once | INTRAVENOUS | Status: AC
  Administered 2023-06-04: 0.4 mg via INTRAVENOUS

## 2023-06-04 MED ORDER — TECHNETIUM TC 99M TETROFOSMIN IV KIT
31.9000 | PACK | Freq: Once | INTRAVENOUS | Status: AC | PRN
  Administered 2023-06-04: 31.9 via INTRAVENOUS

## 2023-06-04 MED ORDER — TECHNETIUM TC 99M TETROFOSMIN IV KIT
10.7000 | PACK | Freq: Once | INTRAVENOUS | Status: AC | PRN
  Administered 2023-06-04: 10.7 via INTRAVENOUS

## 2023-06-05 ENCOUNTER — Encounter: Payer: Self-pay | Admitting: Internal Medicine

## 2023-06-12 ENCOUNTER — Ambulatory Visit (HOSPITAL_COMMUNITY)
Admission: RE | Admit: 2023-06-12 | Discharge: 2023-06-12 | Disposition: A | Discharge status: Home / Self Care | Source: Ambulatory Visit | Attending: Internal Medicine | Admitting: Internal Medicine

## 2023-06-12 DIAGNOSIS — R42 Dizziness and giddiness: Secondary | ICD-10-CM | POA: Insufficient documentation

## 2023-06-12 DIAGNOSIS — R0989 Other specified symptoms and signs involving the circulatory and respiratory systems: Secondary | ICD-10-CM | POA: Insufficient documentation

## 2023-06-13 ENCOUNTER — Encounter: Payer: Self-pay | Admitting: Internal Medicine

## 2023-06-30 ENCOUNTER — Ambulatory Visit: Admitting: Medical

## 2023-07-02 ENCOUNTER — Encounter: Payer: Self-pay | Admitting: Internal Medicine

## 2023-07-02 ENCOUNTER — Ambulatory Visit (INDEPENDENT_AMBULATORY_CARE_PROVIDER_SITE_OTHER): Admitting: Internal Medicine

## 2023-07-02 VITALS — BP 116/80 | HR 75 | Temp 98.0°F | Resp 16 | Ht 64.0 in | Wt 154.5 lb

## 2023-07-02 DIAGNOSIS — F419 Anxiety disorder, unspecified: Secondary | ICD-10-CM | POA: Diagnosis not present

## 2023-07-02 DIAGNOSIS — R079 Chest pain, unspecified: Secondary | ICD-10-CM | POA: Diagnosis not present

## 2023-07-02 NOTE — Progress Notes (Unsigned)
 Subjective:    Patient ID: Alice Howard, female    DOB: 05/31/1945, 78 y.o.   MRN: [id number]  DOS:  07/02/2023 Type of visit - description: Follow-up  Follow-up from previous visit. Neck pain has improved. Only occasionally has a tingling down the left arm. Denies any chest pain. Still has some DOE when she overdoes but overall better .  At this time she also reports anxiety and feeling overwhelmed sometimes.  Review of Systems See above   Past Medical History:  Diagnosis Date   Allergic rhinitis    Baker's cyst    at the L per MRI ordered by ortho aprox 2008 patient thinks is B   History of colonic polyps    multiple Cscopes,   Hyperlipidemia    OA (osteoarthritis)    sees Ortho, 2011 they talked about TKR L   Osteoporosis    Vertigo     Past Surgical History:  Procedure Laterality Date   HEMORRHOID SURGERY  1984   JOINT REPLACEMENT     KNEE SURGERY  1994   due to skiing accident.  Has 2 screws in knee. Gets Euflexxa injections.   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   Procedure: LEFT TOTAL KNEE ARTHROPLASTY WITH HARDWARE REMOVAL;  Surgeon: Aurther Blue, MD;  Location: [facility];  Service: Orthopedics;  Laterality: Left;   TUBAL LIGATION      Current Outpatient Medications  Medication Instructions   Cholecalciferol (VITAMIN D -3 PO) Daily   fluticasone  (FLONASE ) 50 MCG/ACT nasal spray SHAKE LIQUID AND USE 2 SPRAYS IN EACH NOSTRIL DAILY   Multiple Vitamins-Minerals (MULTIVITAMIN PO) 1 tablet, Daily   simvastatin  (ZOCOR ) 40 mg, Oral, Daily at bedtime   TURMERIC PO Take by mouth.       Objective:   Physical Exam BP 116/80   Pulse 75   Temp 98 F (36.7 C) (Oral)   Resp 16   Ht 5\' 4"  (1.626 m)   Wt 154 lb 8 oz (70.1 kg)   SpO2 97%   BMI 26.52 kg/m  General:   Well developed, NAD, BMI noted. HEENT:  Normocephalic . Face symmetric, atraumatic Lower extremities: no pretibial edema bilaterally  Skin: Not pale. Not jaundice Neurologic:  alert &  oriented X3.  Speech normal, gait appropriate for age and unassisted Psych--  Cognition and judgment appear intact.  Cooperative with normal attention span and concentration.  Behavior appropriate. Slightly anxious appearing, tearful at times during the visit     Assessment     Assessment Hyperlipidemia Osteoporosis T score 2009 -2.6, , 2011 -2.4 , 2013 -2.3,  2017 -2.2 Boniva  started a holiday 2015 after 5 years. T score 05-2015 -2.2-continue vitamin D  T score -2.9 07/20/2018 T score 09-2021:-2.7  03/2923: see OV has decided NOT to pursue treatment MSK-- DJD, R ankle pain  Baker's cyst cyst, per MRI 2008 R groin -distal RLQ pain on-off,   2013 abdomen and pelvic CT   (-). Saw ortho:  not related to DJD of the hip. Carotid US  3-17:  < 50%, US  3/19: see report.  Carotid US  0 to 39% bilaterally (06/2023)  PLAN: L neck/arm pain, CP, DOE, decreased carotid pulses?. All symptoms are improved.  Workup reviewed with the patient, results were reassuring.   - carotid ultrasound: 0 to 39% bilaterally. - Myoview : Normal, low risk.  EF normal. Still has very mild neck pain, she will see her orthopedic doctor if needed. Anxiety: Today she reports a lot of anxiety, she is concerned about  her husband who is 27 and is still working full-time.  She sometimes feels overwhelmed.  Extensive listening therapy provided, we agreed she will seek a counselor, information provided.  If at some point she feels the need for medication she will let me know. Reassess at the next opportunity. RTC CPX 09-2023

## 2023-07-02 NOTE — Patient Instructions (Addendum)
   Next office visit for a physical exam by 09/2023 Please make an appointment before you leave today

## 2023-07-03 DIAGNOSIS — Z01419 Encounter for gynecological examination (general) (routine) without abnormal findings: Secondary | ICD-10-CM | POA: Diagnosis not present

## 2023-07-03 NOTE — Assessment & Plan Note (Signed)
 L neck/arm pain, CP, DOE, decreased carotid pulses?. All symptoms are improved.  Workup reviewed with the patient, results were reassuring.   - carotid ultrasound: 0 to 39% bilaterally. - Myoview : Normal, low risk.  EF normal. Still has very mild neck pain, she will see her orthopedic doctor if needed. Anxiety: Today she reports a lot of anxiety, she is concerned about her husband who is 71 and is still working full-time.  She sometimes feels overwhelmed.  Extensive listening therapy provided, we agreed she will seek a counselor, information provided.  If at some point she feels the need for medication she will let me know. Reassess at the next opportunity. RTC CPX 09-2023

## 2023-07-21 DIAGNOSIS — K080 Exfoliation of teeth due to systemic causes: Secondary | ICD-10-CM | POA: Diagnosis not present

## 2023-07-22 DIAGNOSIS — L570 Actinic keratosis: Secondary | ICD-10-CM | POA: Diagnosis not present

## 2023-07-22 DIAGNOSIS — I781 Nevus, non-neoplastic: Secondary | ICD-10-CM | POA: Diagnosis not present

## 2023-07-22 DIAGNOSIS — D485 Neoplasm of uncertain behavior of skin: Secondary | ICD-10-CM | POA: Diagnosis not present

## 2023-08-13 ENCOUNTER — Encounter

## 2023-08-19 ENCOUNTER — Ambulatory Visit (INDEPENDENT_AMBULATORY_CARE_PROVIDER_SITE_OTHER): Admitting: *Deleted

## 2023-08-19 VITALS — Ht 64.0 in | Wt 151.0 lb

## 2023-08-19 DIAGNOSIS — Z0000 Encounter for general adult medical examination without abnormal findings: Secondary | ICD-10-CM

## 2023-08-19 NOTE — Progress Notes (Signed)
 Subjective:   Alice Howard is a 78 y.o. who presents for a Medicare Wellness preventive visit.  As a reminder, Annual Wellness Visits don't include a physical exam, and some assessments may be limited, especially if this visit is performed virtually. We may recommend an in-person follow-up visit with your provider if needed.  Visit Complete: Virtual I connected with  [name] on 08/19/23 by a audio enabled telemedicine application and verified that I am speaking with the correct person using two identifiers.  Patient Location: Home  Provider Location: Office/Clinic  I discussed the limitations of evaluation and management by telemedicine. The patient expressed understanding and agreed to proceed.  Vital Signs: Because this visit was a virtual/telehealth visit, some criteria may be missing or patient reported. Any vitals not documented were not able to be obtained and vitals that have been documented are patient reported.  VideoDeclined- This patient declined Librarian, academic. Therefore the visit was completed with audio only.  Persons Participating in Visit: Patient.  AWV Questionnaire: Yes: Patient Medicare AWV questionnaire was completed by the patient on 08/12/23; I have confirmed that all information answered by patient is correct and no changes since this date.        Objective:     Today's Vitals   08/19/23 0938  Weight: 151 lb (68.5 kg)  Height: 5\' 4"  (1.626 m)   Body mass index is 25.92 kg/m.     08/19/2023   10:12 AM 05/19/2023    3:28 PM 07/04/2022    9:03 AM 07/02/2021   11:47 AM 06/15/2020   10:25 AM 06/10/2019    3:10 PM 06/05/2018   11:24 AM  Advanced Directives  Does Patient Have a Medical Advance Directive? Yes Yes Yes Yes Yes Yes Yes  Type of Advance Directive Living will Healthcare Power of [place];Living will Healthcare Power of [place];Living will Healthcare Power of [place];Living will;Out of facility DNR (pink MOST  or yellow form) Healthcare Power of [place];Living will Healthcare Power of [place];Living will Healthcare Power of [place];Living will  Does patient want to make changes to medical advance directive? No - Patient declined  No - Patient declined   No - Patient declined No - Patient declined  Copy of Healthcare Power of Attorney in Chart?   Yes - validated most recent copy scanned in chart (See row information) Yes - validated most recent copy scanned in chart (See row information) Yes - validated most recent copy scanned in chart (See row information) No - copy requested No - copy requested    Current Medications (verified) Outpatient Encounter Medications as of 08/19/2023  Medication Sig   Cholecalciferol (VITAMIN D -3 PO) Take by mouth daily.   fluticasone  (FLONASE ) 50 MCG/ACT nasal spray SHAKE LIQUID AND USE 2 SPRAYS IN EACH NOSTRIL DAILY   Multiple Vitamins-Minerals (MULTIVITAMIN PO) Take 1 tablet by mouth daily.   simvastatin  (ZOCOR ) 40 MG tablet Take 1 tablet (40 mg total) by mouth at bedtime.   TURMERIC PO Take by mouth.   No facility-administered encounter medications on file as of 08/19/2023.    Allergies (verified) Patient has no known allergies.   History: Past Medical History:  Diagnosis Date   Allergic rhinitis    Baker's cyst    at the L per MRI ordered by ortho aprox 2008 patient thinks is B   History of colonic polyps    multiple Cscopes,   Hyperlipidemia    OA (osteoarthritis)    sees Ortho, 2011 they talked about  TKR L   Osteoporosis    Vertigo    Past Surgical History:  Procedure Laterality Date   HEMORRHOID SURGERY  1984   JOINT REPLACEMENT     KNEE SURGERY  1994   due to skiing accident.  Has 2 screws in knee. Gets Euflexxa injections.   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   Procedure: LEFT TOTAL KNEE ARTHROPLASTY WITH HARDWARE REMOVAL;  Surgeon: Aurther Blue, MD;  Location: [facility];  Service: Orthopedics;  Laterality: Left;   TUBAL LIGATION      Family History  Problem Relation Age of Onset   Colon cancer Mother 69   Hypertension Mother    Cancer Mother    Early death Mother    Cancer Father 53       "myeloid metaplasia? similar to leukemia"   Heart attack Other 52       GMx 2    Breast cancer Neg Hx    Diabetes Neg Hx    Social History   Socioeconomic History   Marital status: Married    Spouse name: Not on file   Number of children: 2   Years of education: Not on file   Highest education level: Bachelor's degree (e.g., BA, AB, BS)  Occupational History   Occupation: Engineer, maintenance (IT)-- retired 2012     Employer: [employer]  Tobacco Use   Smoking status: Never   Smokeless tobacco: Never  Substance and Sexual Activity   Alcohol use: Yes    Alcohol/week: 2.0 standard drinks of alcohol    Types: 2 Glasses of wine per week    Comment: 2 times a week, wine   Drug use: No   Sexual activity: Yes  Other Topics Concern   Not on file  Social History Narrative   Household-- pt and husband, healthy marriage   3 g-children     daughter lives in [place]       Social Drivers of Health   Financial Resource Strain: Low Risk  (08/19/2023)   Overall Financial Resource Strain (CARDIA)    Difficulty of Paying Living Expenses: Not very hard  Food Insecurity: No Food Insecurity (08/19/2023)   Hunger Vital Sign    Worried About Running Out of Food in the Last Year: Never true    Ran Out of Food in the Last Year: Never true  Transportation Needs: No Transportation Needs (08/19/2023)   PRAPARE - Administrator, Civil Service (Medical): No    Lack of Transportation (Non-Medical): No  Physical Activity: Sufficiently Active (08/19/2023)   Exercise Vital Sign    Days of Exercise per Week: 4 days    Minutes of Exercise per Session: 60 min  Stress: No Stress Concern Present (08/19/2023)   [employer] of Occupational Health - Occupational Stress Questionnaire    Feeling of Stress : Not at all   Social Connections: Socially Integrated (08/19/2023)   Social Connection and Isolation Panel [NHANES]    Frequency of Communication with Friends and Family: More than three times a week    Frequency of Social Gatherings with Friends and Family: More than three times a week    Attends Religious Services: More than 4 times per year    Active Member of [employer] or Organizations: Yes    Attends Engineer, structural: More than 4 times per year    Marital Status: Married    Tobacco Counseling Counseling given: Not Answered    Clinical Intake:  Pre-visit preparation completed: Yes  BMI - recorded: 25.92 Nutritional Status: BMI 25 -29 Overweight Nutritional Risks: None Diabetes: No  Lab Results  Component Value Date   HGBA1C 5.8 10/07/2022   HGBA1C 5.6 01/06/2008     What is the last grade level you completed in school?: Bachelor's degree  Interpreter Needed?: No   Activities of Daily Living     08/12/2023    5:26 PM  In your present state of health, do you have any difficulty performing the following activities:  Hearing? 0  Vision? 0  Difficulty concentrating or making decisions? 0  Walking or climbing stairs? 0  Dressing or bathing? 0  Doing errands, shopping? 0  Preparing Food and eating ? N  In the past six months, have you accidently leaked urine? N  Do you have problems with loss of bowel control? N  Managing your Medications? N  Managing your Finances? N  Housekeeping or managing your Housekeeping? N    Patient Care Team: Ezell Hollow, MD as PCP - General Meisinger, Ena Harries, MD as Consulting Physician (Obstetrics and Gynecology) Amada Backer, MD as Consulting Physician (Orthopedic Surgery) Jacqui Mau, [facility] (Inactive) as Counselor (Psychology) Sandor Crosser, OD as Referring Physician (Optometry)  I have updated your Care Teams any recent Medical Services you may have received from other providers in the past year.     Assessment:    This  is a routine wellness examination for [place].  Hearing/Vision screen Hearing Screening - Comments:: Denies difficulty Vision Screening - Comments:: Last eye exam 05/2023 w/Staci Roderick [place] Jonette Nestle)   Goals Addressed             This Visit's Progress    Patient Stated   On track    Maintain current health       Depression Screen     08/19/2023    9:51 AM 07/02/2023   10:29 AM 04/09/2023    8:11 AM 10/07/2022    8:08 AM 07/04/2022    9:07 AM 03/26/2022    3:42 PM 02/06/2022    8:18 AM  PHQ 2/9 Scores  PHQ - 2 Score 0 1 1 1  0 0 0    Fall Risk     08/19/2023    9:50 AM 08/12/2023    5:26 PM 07/02/2023   10:29 AM 04/09/2023    8:11 AM 10/07/2022    8:08 AM  Fall Risk   Falls in the past year? 0 0   1 0 0 0  Number falls in past yr: 0 0 0 0 0  Injury with Fall? 0 0 0 0 0  Risk for fall due to : No Fall Risks No Fall Risks     Follow up Education provided Education provided Falls evaluation completed;Education provided Falls evaluation completed;Education provided Falls evaluation completed    MEDICARE RISK AT HOME:  Medicare Risk at Home Any stairs in or around the home?: Yes If so, are there any without handrails?: No Home free of loose throw rugs in walkways, pet beds, electrical cords, etc?: Yes Adequate lighting in your home to reduce risk of falls?: Yes Life alert?: No Use of a cane, walker or w/c?: No Grab bars in the bathroom?: No Shower chair or bench in shower?: No Elevated toilet seat or a handicapped toilet?: No  TIMED UP AND GO:  Was the test performed?  No, audio  Cognitive Function: 6CIT completed    05/26/2017    8:30 AM  MMSE - Mini Mental State Exam  Orientation to time  5  Orientation to Place 5  Registration 3  Attention/ Calculation 5  Recall 3  Language- name 2 objects 2  Language- repeat 1  Language- follow 3 step command 3  Language- read & follow direction 1  Write a sentence 1  Copy design 1  Total score 30        08/19/2023     9:56 AM 07/04/2022    9:13 AM 07/02/2021   11:48 AM  6CIT Screen  What Year? 0 points 0 points 0 points  What month? 0 points 0 points 0 points  What time? 0 points 0 points 0 points  Count back from 20 0 points 0 points 0 points  Months in reverse 0 points 0 points 0 points  Repeat phrase 0 points 0 points 0 points  Total Score 0 points 0 points 0 points    Immunizations Immunization History  Administered Date(s) Administered   Fluad Quad(high Dose 65+) 01/13/2020   Influenza Split 01/01/2011, 11/21/2011, 12/23/2012   Influenza Whole 12/28/2009   Influenza, High Dose Seasonal PF 12/07/2017, 12/06/2018, 02/10/2023   Influenza-Unspecified 12/23/2013, 11/02/2014, 12/14/2015, 12/09/2020   PFIZER(Purple Top)SARS-COV-2 Vaccination 04/02/2019, 04/23/2019, 01/06/2020   PNEUMOCOCCAL CONJUGATE-20 10/03/2021   PPD Test 02/03/2012, 12/25/2022   Pneumococcal Conjugate-13 03/07/2014   Pneumococcal Polysaccharide-23 11/21/2011   Td 04/11/2005, 05/09/2015   Td (Adult),unspecified 10/07/1986   Zoster Recombinant(Shingrix ) 11/02/2018, 01/11/2019   Zoster, Live 12/10/2010    Screening Tests Health Maintenance  Topic Date Due   Medicare Annual Wellness (AWV)  07/04/2023   COVID-19 Vaccine (4 - 2024-25 season) 08/18/2024 (Originally 11/10/2022)   INFLUENZA VACCINE  10/10/2023   DTaP/Tdap/Td (3 - Tdap) 05/08/2025   Colonoscopy  07/15/2027   Pneumonia Vaccine 20+ Years old  Completed   DEXA SCAN  Completed   Hepatitis C Screening  Completed   Zoster Vaccines- Shingrix   Completed   HPV VACCINES  Aged Out   Meningococcal B Vaccine  Aged Out    Health Maintenance  Health Maintenance Due  Topic Date Due   Medicare Annual Wellness (AWV)  07/04/2023   Health Maintenance Items Addressed: Declines further COVID vaccines. Up to date with all other HM.  Additional Screening:  Vision Screening: Recommended annual ophthalmology exams for early detection of glaucoma and other disorders of the  eye. Would you like a referral to an eye doctor? No    Dental Screening: Recommended annual dental exams for proper oral hygiene  Community Resource Referral / Chronic Care Management: CRR required this visit?  No   CCM required this visit?  No   Plan:    I have personally reviewed and noted the following in the patient's chart:   Medical and social history Use of alcohol, tobacco or illicit drugs  Current medications and supplements including opioid prescriptions. Patient is not currently taking opioid prescriptions. Functional ability and status Nutritional status Physical activity Advanced directives List of other physicians Hospitalizations, surgeries, and ER visits in previous 12 months Vitals Screenings to include cognitive, depression, and falls Referrals and appointments  In addition, I have reviewed and discussed with patient certain preventive protocols, quality metrics, and best practice recommendations. A written personalized care plan for preventive services as well as general preventive health recommendations were provided to patient.   Susa Engman, CMA   08/19/2023   After Visit Summary: (MyChart) Due to this being a telephonic visit, the after visit summary with patients personalized plan was offered to patient via MyChart   Notes: Nothing significant to report  at this time.

## 2023-08-19 NOTE — Patient Instructions (Signed)
 Ms. Alice Howard , Thank you for taking time out of your busy schedule to complete your Annual Wellness Visit with me. I enjoyed our conversation and look forward to speaking with you again next year. I, as well as your care team,  appreciate your ongoing commitment to your health goals. Please review the following plan we discussed and let me know if I can assist you in the future. Your Game plan/ To Do List     Follow up Visits: Next Medicare AWV with our clinical staff: 08/19/24 1:40pm   Next Office Visit with your provider: 10/01/23  9:20am  Clinician Recommendations:  Aim for 30 minutes of exercise or brisk walking, 6-8 glasses of water, and 5 servings of fruits and vegetables each day.       This is a list of the screening recommended for you and due dates:  Health Maintenance  Topic Date Due   Medicare Annual Wellness Visit  07/04/2023   COVID-19 Vaccine (4 - 2024-25 season) 08/18/2024*   Flu Shot  10/10/2023   DTaP/Tdap/Td vaccine (3 - Tdap) 05/08/2025   Colon Cancer Screening  07/15/2027   Pneumonia Vaccine  Completed   DEXA scan (bone density measurement)  Completed   Hepatitis C Screening  Completed   Zoster (Shingles) Vaccine  Completed   HPV Vaccine  Aged Out   Meningitis B Vaccine  Aged Out  *Topic was postponed. The date shown is not the original due date.   I did not find a copy of a healthcare power of attorney in your chart.  Please send a copy to us  by either of the following below.  Advanced directives: (Copy Requested) Please bring a copy of your health care power of attorney and living will to the office to be added to your chart at your convenience. You can mail to [facility] [address] [place], [place] [address] or email to [email or web address] Advance Care Planning is important because it:  [x]  Makes sure you receive the medical care that is consistent with your values, goals, and preferences  [x]  It provides guidance to your family and  loved ones and reduces their decisional burden about whether or not they are making the right decisions based on your wishes.  Follow the link provided in your after visit summary or read over the paperwork we have mailed to you to help you started getting your Advance Directives in place. If you need assistance in completing these, please reach out to us  so that we can help you!  See attachments for Preventive Care and Fall Prevention Tips.

## 2023-08-25 ENCOUNTER — Encounter: Payer: Self-pay | Admitting: Internal Medicine

## 2023-10-01 ENCOUNTER — Ambulatory Visit: Admitting: Internal Medicine

## 2023-10-01 ENCOUNTER — Encounter: Payer: Self-pay | Admitting: Internal Medicine

## 2023-10-02 ENCOUNTER — Other Ambulatory Visit (HOSPITAL_BASED_OUTPATIENT_CLINIC_OR_DEPARTMENT_OTHER): Payer: Self-pay | Admitting: Internal Medicine

## 2023-10-02 DIAGNOSIS — Z1231 Encounter for screening mammogram for malignant neoplasm of breast: Secondary | ICD-10-CM

## 2023-10-08 ENCOUNTER — Encounter: Payer: Medicare Other | Admitting: Internal Medicine

## 2023-10-20 ENCOUNTER — Encounter: Payer: Self-pay | Admitting: Internal Medicine

## 2023-10-20 ENCOUNTER — Encounter (HOSPITAL_BASED_OUTPATIENT_CLINIC_OR_DEPARTMENT_OTHER): Payer: Self-pay

## 2023-10-20 ENCOUNTER — Ambulatory Visit (INDEPENDENT_AMBULATORY_CARE_PROVIDER_SITE_OTHER): Admitting: Internal Medicine

## 2023-10-20 ENCOUNTER — Ambulatory Visit (HOSPITAL_BASED_OUTPATIENT_CLINIC_OR_DEPARTMENT_OTHER)
Admission: RE | Admit: 2023-10-20 | Discharge: 2023-10-20 | Disposition: A | Discharge status: Home / Self Care | Source: Ambulatory Visit | Attending: Internal Medicine | Admitting: Internal Medicine

## 2023-10-20 VITALS — BP 122/66 | HR 71 | Resp 16 | Ht 64.0 in | Wt 153.0 lb

## 2023-10-20 DIAGNOSIS — Z0000 Encounter for general adult medical examination without abnormal findings: Secondary | ICD-10-CM

## 2023-10-20 DIAGNOSIS — M810 Age-related osteoporosis without current pathological fracture: Secondary | ICD-10-CM

## 2023-10-20 DIAGNOSIS — R739 Hyperglycemia, unspecified: Secondary | ICD-10-CM | POA: Diagnosis not present

## 2023-10-20 DIAGNOSIS — Z1231 Encounter for screening mammogram for malignant neoplasm of breast: Secondary | ICD-10-CM | POA: Diagnosis not present

## 2023-10-20 DIAGNOSIS — E785 Hyperlipidemia, unspecified: Secondary | ICD-10-CM

## 2023-10-20 NOTE — Assessment & Plan Note (Addendum)
 Here for CPX   Other issues Anxiety:  still worries about her husband (still working at age 78) but overall feeling better.   Hyperlipidemia: On simvastatin , check FLP AST ALT. Osteoporosis: Will not take any sort of treatment other than vitamin D . RTC 6 months

## 2023-10-20 NOTE — Patient Instructions (Addendum)
 Vaccine recommendation Flu shot this fall A COVID booster RSV   GO TO THE LAB :  Get the blood work   Your results will be posted on MyChart with my comments  Next office visit for a checkup in 6 months Please make an appointment before you leave today

## 2023-10-20 NOTE — Progress Notes (Signed)
 Subjective:    Patient ID: Alice Howard, female    DOB: December 12, 1945, 78 y.o.   MRN: [id number]  DOS:  10/20/2023 Type of visit - description: Here for CPX  Here for CPX. Feeling very well. Did have diarrhea 2 weeks ago, no blood in the stools.  That is largely resolved  Review of Systems  Other than above, a 14 point review of systems is negative        Past Medical History:  Diagnosis Date   Allergic rhinitis    Baker's cyst    at the L per MRI ordered by ortho aprox 2008 patient thinks is B   History of colonic polyps    multiple Cscopes,   Hyperlipidemia    OA (osteoarthritis)    sees Ortho, 2011 they talked about TKR L   Osteoporosis    Vertigo     Past Surgical History:  Procedure Laterality Date   HEMORRHOID SURGERY  1984   JOINT REPLACEMENT     KNEE SURGERY  1994   due to skiing accident.  Has 2 screws in knee. Gets Euflexxa injections.   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   Procedure: LEFT TOTAL KNEE ARTHROPLASTY WITH HARDWARE REMOVAL;  Surgeon: Dempsey LULLA Moan, MD;  Location: [facility];  Service: Orthopedics;  Laterality: Left;   TUBAL LIGATION     Social History   Socioeconomic History   Marital status: Married    Spouse name: Not on file   Number of children: 2   Years of education: Not on file   Highest education level: Bachelor's degree (e.g., BA, AB, BS)  Occupational History   Occupation: Engineer, maintenance (IT)-- retired 2012     Employer: [employer]  Tobacco Use   Smoking status: Never   Smokeless tobacco: Never  Substance and Sexual Activity   Alcohol use: Yes    Alcohol/week: 2.0 standard drinks of alcohol    Types: 2 Glasses of wine per week    Comment: 2 times a week, wine   Drug use: No   Sexual activity: Yes  Other Topics Concern   Not on file  Social History Narrative   Household-- pt and husband, healthy marriage   3 g-children     daughter lives in [place]       Social Drivers of Health   Financial  Resource Strain: Low Risk  (08/19/2023)   Overall Financial Resource Strain (CARDIA)    Difficulty of Paying Living Expenses: Not very hard  Food Insecurity: No Food Insecurity (08/19/2023)   Hunger Vital Sign    Worried About Running Out of Food in the Last Year: Never true    Ran Out of Food in the Last Year: Never true  Transportation Needs: No Transportation Needs (08/19/2023)   PRAPARE - Administrator, Civil Service (Medical): No    Lack of Transportation (Non-Medical): No  Physical Activity: Sufficiently Active (08/19/2023)   Exercise Vital Sign    Days of Exercise per Week: 4 days    Minutes of Exercise per Session: 60 min  Stress: No Stress Concern Present (08/19/2023)   [employer] of Occupational Health - Occupational Stress Questionnaire    Feeling of Stress : Not at all  Social Connections: Socially Integrated (08/19/2023)   Social Connection and Isolation Panel    Frequency of Communication with Friends and Family: More than three times a week    Frequency of Social Gatherings with Friends and Family: More than three times a week  Attends Religious Services: More than 4 times per year    Active Member of Clubs or Organizations: Yes    Attends Banker Meetings: More than 4 times per year    Marital Status: Married  Catering manager Violence: Not At Risk (08/19/2023)   Humiliation, Afraid, Rape, and Kick questionnaire    Fear of Current or Ex-Partner: No    Emotionally Abused: No    Physically Abused: No    Sexually Abused: No    Current Outpatient Medications  Medication Instructions   Cholecalciferol (VITAMIN D -3 PO) Daily   fluticasone  (FLONASE ) 50 MCG/ACT nasal spray SHAKE LIQUID AND USE 2 SPRAYS IN EACH NOSTRIL DAILY   Multiple Vitamins-Minerals (MULTIVITAMIN PO) 1 tablet, Daily   simvastatin  (ZOCOR ) 40 mg, Oral, Daily at bedtime   TURMERIC PO Take by mouth.       Objective:   Physical Exam BP 122/66 (BP Location: Left Arm,  Patient Position: Sitting, Cuff Size: Normal)   Pulse 71   Resp 16   Ht 5' 4 (1.626 m)   Wt 153 lb (69.4 kg)   SpO2 100%   BMI 26.26 kg/m  General: Well developed, NAD, BMI noted Neck: No  thyromegaly  HEENT:  Normocephalic . Face symmetric, atraumatic Lungs:  CTA B Normal respiratory effort, no intercostal retractions, no accessory muscle use. Heart: RRR,  no murmur.  Abdomen:  Not distended, soft, non-tender. No rebound or rigidity.   Lower extremities: no pretibial edema bilaterally  Skin: Exposed areas without rash. Not pale. Not jaundice Neurologic:  alert & oriented X3.  Speech normal, gait appropriate for age and unassisted Strength symmetric and appropriate for age.  Psych: Cognition and judgment appear intact.  Cooperative with normal attention span and concentration.  Behavior appropriate. No anxious or depressed appearing.     Assessment    Assessment Hyperlipidemia Osteoporosis T score 2009 -2.6, , 2011 -2.4 , 2013 -2.3,  2017 -2.2 [place]  started a holiday 2015 after 5 years. T score 05-2015 -2.2-continue vitamin D  T score -2.9 07/20/2018 T score 09-2021:-2.7  03/2923: see OV has decided NOT to pursue treatment MSK-- DJD, R ankle pain  Baker's cyst cyst, per MRI 2008 R groin -distal RLQ pain on-off,   2013 abdomen and pelvic CT   (-). Saw ortho:  not related to DJD of the hip. Carotid US  3-17:  < 50%, US  3/19: see report.  Carotid US  0 to 39% bilaterally (06/2023)  PLAN: Here for CPX -Td 04-2015 -Pneumonia 2013; prevnar 03-07-14; PNM 20: 2023 - zostavax 2012;  s/p shingrex  -Vaccines I recommend: RSV, flu and COVID booster. - CCS: 02/14/12 w/ Dr. Dyane; 5mm polyp removed from rectum; cscope  04/10/2017; s/p  colonoscopy May 2024.  Next -if any- per Gi. - Plans to see gynecology every 2 years. --MMG done today --- Lifestyle discussed ---Labs: Reviewed, will get AST ALT FLP A1c - POA on file   Other issues Anxiety:  still worries about her husband  (still working at age 30) but overall feeling better.   Hyperlipidemia: On simvastatin , check FLP AST ALT. Osteoporosis: Will not take any sort of treatment other than vitamin D . RTC 6 months

## 2023-10-20 NOTE — Assessment & Plan Note (Signed)
 Here for CPX -Td 04-2015 -Pneumonia 2013; prevnar 03-07-14; PNM 20: 2023 - zostavax 2012;  s/p shingrex  -Vaccines I recommend: RSV, flu and COVID booster. - CCS: 02/14/12 w/ Dr. Dyane; 5mm polyp removed from rectum; cscope  04/10/2017; s/p  colonoscopy May 2024.  Next -if any- per Gi. - Plans to see gynecology every 2 years. --MMG done today --- Lifestyle discussed ---Labs: Reviewed, will get AST ALT FLP A1c - POA on file

## 2023-10-21 LAB — LIPID PANEL
Cholesterol: 205 mg/dL — ABNORMAL HIGH (ref 0–200)
HDL: 59.6 mg/dL (ref >39.00)
LDL Cholesterol: 120 mg/dL — ABNORMAL HIGH (ref 0–99)
NonHDL: 145.51
Total CHOL/HDL Ratio: 3
Triglycerides: 128 mg/dL (ref 0.0–149.0)
VLDL: 25.6 mg/dL (ref 0.0–40.0)

## 2023-10-21 LAB — ALT: ALT: 16 U/L (ref 0–35)

## 2023-10-21 LAB — AST: AST: 20 U/L (ref 0–37)

## 2023-10-21 LAB — HEMOGLOBIN A1C: Hgb A1c MFr Bld: 6 % (ref 4.6–6.5)

## 2023-10-22 ENCOUNTER — Ambulatory Visit: Payer: Self-pay | Admitting: Internal Medicine

## 2023-11-21 ENCOUNTER — Other Ambulatory Visit: Payer: Self-pay | Admitting: Internal Medicine

## 2023-11-24 DIAGNOSIS — H53002 Unspecified amblyopia, left eye: Secondary | ICD-10-CM | POA: Diagnosis not present

## 2023-11-24 DIAGNOSIS — H1789 Other corneal scars and opacities: Secondary | ICD-10-CM | POA: Diagnosis not present

## 2023-11-24 DIAGNOSIS — H25013 Cortical age-related cataract, bilateral: Secondary | ICD-10-CM | POA: Diagnosis not present

## 2023-11-24 DIAGNOSIS — H2513 Age-related nuclear cataract, bilateral: Secondary | ICD-10-CM | POA: Diagnosis not present

## 2024-01-07 ENCOUNTER — Ambulatory Visit: Admitting: Family

## 2024-01-07 ENCOUNTER — Ambulatory Visit (HOSPITAL_BASED_OUTPATIENT_CLINIC_OR_DEPARTMENT_OTHER)
Admission: RE | Admit: 2024-01-07 | Discharge: 2024-01-07 | Disposition: A | Discharge status: Home / Self Care | Source: Ambulatory Visit | Attending: Family | Admitting: Family

## 2024-01-07 VITALS — BP 123/69 | HR 73 | Temp 98.4°F | Resp 16 | Ht 64.0 in | Wt 154.0 lb

## 2024-01-07 DIAGNOSIS — M4185 Other forms of scoliosis, thoracolumbar region: Secondary | ICD-10-CM | POA: Diagnosis not present

## 2024-01-07 DIAGNOSIS — M545 Low back pain, unspecified: Secondary | ICD-10-CM | POA: Insufficient documentation

## 2024-01-07 DIAGNOSIS — M47816 Spondylosis without myelopathy or radiculopathy, lumbar region: Secondary | ICD-10-CM | POA: Diagnosis not present
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
## 2024-01-09 ENCOUNTER — Ambulatory Visit: Payer: Self-pay | Admitting: Family

## 2024-01-21 DIAGNOSIS — K080 Exfoliation of teeth due to systemic causes: Secondary | ICD-10-CM | POA: Diagnosis not present

## 2024-04-19 ENCOUNTER — Ambulatory Visit: Admitting: Internal Medicine

## 2024-08-19 ENCOUNTER — Ambulatory Visit
